# Patient Record
Sex: Female | Born: 1990
Health system: Southern US, Community
[De-identification: ages and names within clinical notes are randomized; demographics above are authoritative.]

## PROBLEM LIST (undated history)

## (undated) ENCOUNTER — Inpatient Hospital Stay: Payer: Self-pay

## (undated) DIAGNOSIS — F329 Major depressive disorder, single episode, unspecified: Secondary | ICD-10-CM

## (undated) DIAGNOSIS — K219 Gastro-esophageal reflux disease without esophagitis: Secondary | ICD-10-CM

## (undated) DIAGNOSIS — I10 Essential (primary) hypertension: Secondary | ICD-10-CM

## (undated) DIAGNOSIS — R51 Headache: Secondary | ICD-10-CM

## (undated) DIAGNOSIS — D649 Anemia, unspecified: Secondary | ICD-10-CM

## (undated) DIAGNOSIS — R519 Headache, unspecified: Secondary | ICD-10-CM

## (undated) DIAGNOSIS — B1910 Unspecified viral hepatitis B without hepatic coma: Secondary | ICD-10-CM

## (undated) DIAGNOSIS — F32A Depression, unspecified: Secondary | ICD-10-CM

## (undated) DIAGNOSIS — F419 Anxiety disorder, unspecified: Secondary | ICD-10-CM

## (undated) DIAGNOSIS — O98419 Viral hepatitis complicating pregnancy, unspecified trimester: Secondary | ICD-10-CM

## (undated) DIAGNOSIS — F319 Bipolar disorder, unspecified: Secondary | ICD-10-CM

## (undated) HISTORY — PX: NO PAST SURGERIES: SHX2092

---
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
## 2009-03-01 ENCOUNTER — Emergency Department: Payer: Self-pay | Admitting: Emergency Medicine

## 2010-02-18 ENCOUNTER — Emergency Department: Payer: Self-pay

## 2010-05-17 ENCOUNTER — Emergency Department: Payer: Self-pay | Admitting: Emergency Medicine

## 2010-06-27 ENCOUNTER — Emergency Department: Payer: Self-pay | Admitting: Emergency Medicine

## 2010-07-24 ENCOUNTER — Emergency Department: Payer: Self-pay | Admitting: Internal Medicine

## 2010-08-04 ENCOUNTER — Emergency Department: Payer: Self-pay | Admitting: Emergency Medicine

## 2010-08-06 ENCOUNTER — Encounter: Payer: Self-pay | Admitting: Maternal & Fetal Medicine

## 2010-08-20 ENCOUNTER — Encounter: Payer: Self-pay | Admitting: Obstetrics and Gynecology

## 2010-08-22 ENCOUNTER — Observation Stay: Payer: Self-pay

## 2010-09-07 ENCOUNTER — Observation Stay: Payer: Self-pay | Admitting: Obstetrics and Gynecology

## 2010-11-06 ENCOUNTER — Observation Stay: Payer: Self-pay | Admitting: Obstetrics and Gynecology

## 2010-12-01 ENCOUNTER — Observation Stay: Payer: Self-pay | Admitting: Obstetrics and Gynecology

## 2010-12-24 ENCOUNTER — Observation Stay: Payer: Self-pay

## 2011-01-03 ENCOUNTER — Observation Stay: Payer: Self-pay

## 2011-01-07 ENCOUNTER — Observation Stay: Payer: Self-pay

## 2011-01-09 ENCOUNTER — Inpatient Hospital Stay: Payer: Self-pay

## 2012-03-08 ENCOUNTER — Inpatient Hospital Stay: Payer: Self-pay

## 2012-03-08 LAB — HEPATIC FUNCTION PANEL A (ARMC)
Albumin: 2.8 g/dL — ABNORMAL LOW (ref 3.4–5.0)
Alkaline Phosphatase: 116 U/L (ref 50–136)
Bilirubin, Direct: 0.1 mg/dL (ref 0.00–0.20)
Bilirubin,Total: 0.6 mg/dL (ref 0.2–1.0)
SGOT(AST): 31 U/L (ref 15–37)
SGPT (ALT): 35 U/L
Total Protein: 7 g/dL (ref 6.4–8.2)

## 2012-03-08 LAB — PIH PROFILE
Anion Gap: 10 (ref 7–16)
BUN: 7 mg/dL (ref 7–18)
Calcium, Total: 8.5 mg/dL (ref 8.5–10.1)
Chloride: 107 mmol/L (ref 98–107)
Co2: 22 mmol/L (ref 21–32)
Creatinine: 0.58 mg/dL — ABNORMAL LOW (ref 0.60–1.30)
EGFR (African American): >60
EGFR (Non-African Amer.): >60
Glucose: 82 mg/dL (ref 65–99)
HCT: 36.1 % (ref 35.0–47.0)
HGB: 12.1 g/dL (ref 12.0–16.0)
MCH: 30.2 pg (ref 26.0–34.0)
MCHC: 33.4 g/dL (ref 32.0–36.0)
MCV: 90 fL (ref 80–100)
Osmolality: 275 (ref 275–301)
Platelet: 179 10*3/uL (ref 150–440)
Potassium: 3.8 mmol/L (ref 3.5–5.1)
RBC: 3.99 10*6/uL (ref 3.80–5.20)
RDW: 14.2 % (ref 11.5–14.5)
SGOT(AST): 29 U/L (ref 15–37)
Sodium: 139 mmol/L (ref 136–145)
Uric Acid: 4.6 mg/dL (ref 2.6–6.0)
WBC: 6.2 10*3/uL (ref 3.6–11.0)

## 2012-03-08 LAB — DRUG SCREEN, URINE

## 2012-03-08 LAB — CBC WITH DIFFERENTIAL/PLATELET
Basophil #: 0 10*3/uL (ref 0.0–0.1)
Basophil %: 0.3 %
Eosinophil #: 0 10*3/uL (ref 0.0–0.7)
Eosinophil %: 0.1 %
HCT: 31.9 % — ABNORMAL LOW (ref 35.0–47.0)
HGB: 10.7 g/dL — ABNORMAL LOW (ref 12.0–16.0)
Lymphocyte #: 1.5 10*3/uL (ref 1.0–3.6)
Lymphocyte %: 19 %
MCH: 30.5 pg (ref 26.0–34.0)
MCHC: 33.5 g/dL (ref 32.0–36.0)
MCV: 91 fL (ref 80–100)
Monocyte #: 0.7 10*3/uL (ref 0.0–0.7)
Monocyte %: 8.5 %
Neutrophil #: 5.6 10*3/uL (ref 1.4–6.5)
Neutrophil %: 72.1 %
Platelet: 147 10*3/uL — ABNORMAL LOW (ref 150–440)
RBC: 3.5 10*6/uL — ABNORMAL LOW (ref 3.80–5.20)
RDW: 14 % (ref 11.5–14.5)
WBC: 7.8 10*3/uL (ref 3.6–11.0)

## 2012-03-08 LAB — RAPID HIV-1/2 QL/CONFIRM: HIV-1/2,Rapid Ql: NEGATIVE

## 2012-03-08 LAB — PROTEIN / CREATININE RATIO, URINE
Creatinine, Urine: 127.3 mg/dL — ABNORMAL HIGH (ref 30.0–125.0)
Protein, Random Urine: 29 mg/dL — ABNORMAL HIGH (ref 0–12)
Protein/Creat. Ratio: 228 mg/gCREAT — ABNORMAL HIGH (ref 0–200)

## 2012-03-09 LAB — HEMATOCRIT: HCT: 26.4 % — ABNORMAL LOW (ref 35.0–47.0)

## 2012-03-12 LAB — BETA STREP CULTURE(ARMC)

## 2012-04-07 ENCOUNTER — Emergency Department: Payer: Self-pay | Admitting: Emergency Medicine

## 2012-04-07 LAB — URINALYSIS, COMPLETE
Bilirubin,UR: NEGATIVE
Glucose,UR: NEGATIVE mg/dL (ref 0–75)
Ketone: NEGATIVE
Nitrite: NEGATIVE
Ph: 6 (ref 4.5–8.0)
Protein: 30
RBC,UR: 2429 /HPF (ref 0–5)
Specific Gravity: 1.025 (ref 1.003–1.030)
Squamous Epithelial: 2
WBC UR: 14 /HPF (ref 0–5)

## 2012-04-07 LAB — CBC
HCT: 36 % (ref 35.0–47.0)
HGB: 11.8 g/dL — ABNORMAL LOW (ref 12.0–16.0)
MCH: 29.2 pg (ref 26.0–34.0)
MCHC: 32.7 g/dL (ref 32.0–36.0)
MCV: 89 fL (ref 80–100)
Platelet: 225 10*3/uL (ref 150–440)
RBC: 4.04 10*6/uL (ref 3.80–5.20)
RDW: 14.1 % (ref 11.5–14.5)
WBC: 5.7 10*3/uL (ref 3.6–11.0)

## 2012-04-07 LAB — PREGNANCY, URINE: Pregnancy Test, Urine: NEGATIVE m[IU]/mL

## 2012-06-27 ENCOUNTER — Emergency Department: Payer: Self-pay | Admitting: Internal Medicine

## 2012-06-27 LAB — COMPREHENSIVE METABOLIC PANEL
Albumin: 3.4 g/dL (ref 3.4–5.0)
Alkaline Phosphatase: 105 U/L (ref 50–136)
Anion Gap: 9 (ref 7–16)
BUN: 8 mg/dL (ref 7–18)
Bilirubin,Total: 0.6 mg/dL (ref 0.2–1.0)
Calcium, Total: 8.5 mg/dL (ref 8.5–10.1)
Chloride: 106 mmol/L (ref 98–107)
Co2: 26 mmol/L (ref 21–32)
Creatinine: 0.72 mg/dL (ref 0.60–1.30)
EGFR (African American): >60
EGFR (Non-African Amer.): >60
Glucose: 94 mg/dL (ref 65–99)
Osmolality: 279 (ref 275–301)
Potassium: 3.9 mmol/L (ref 3.5–5.1)
SGOT(AST): 25 U/L (ref 15–37)
SGPT (ALT): 31 U/L
Sodium: 141 mmol/L (ref 136–145)
Total Protein: 8.1 g/dL (ref 6.4–8.2)

## 2012-06-27 LAB — PREGNANCY, URINE: Pregnancy Test, Urine: NEGATIVE m[IU]/mL

## 2012-06-27 LAB — CBC
HCT: 35 % (ref 35.0–47.0)
HGB: 10.9 g/dL — ABNORMAL LOW (ref 12.0–16.0)
MCH: 26.3 pg (ref 26.0–34.0)
MCHC: 31.2 g/dL — ABNORMAL LOW (ref 32.0–36.0)
MCV: 84 fL (ref 80–100)
Platelet: 236 10*3/uL (ref 150–440)
RBC: 4.16 10*6/uL (ref 3.80–5.20)
RDW: 14.8 % — ABNORMAL HIGH (ref 11.5–14.5)
WBC: 4.9 10*3/uL (ref 3.6–11.0)

## 2012-06-27 LAB — URINALYSIS, COMPLETE
Bilirubin,UR: NEGATIVE
Glucose,UR: NEGATIVE mg/dL (ref 0–75)
Ketone: NEGATIVE
Leukocyte Esterase: NEGATIVE
Nitrite: POSITIVE
Ph: 7 (ref 4.5–8.0)
Protein: NEGATIVE
RBC,UR: 1377 /HPF (ref 0–5)
Specific Gravity: 1.017 (ref 1.003–1.030)
Squamous Epithelial: 3
WBC UR: 5 /HPF (ref 0–5)

## 2015-04-18 NOTE — H&P (Signed)
L&D Evaluation:  History:   HPI 24 year old G4 P1021 who had her first delivery 10 Jan 2011 presents at 373/7 weeks with c/o contractions since around midnight. Has had no prenatal care. EDC=18 April based upon her LMP which she cannot remember. She also c/o lower extremity swelling R>L for the past two weeks, right temporal headaches, and nausea.Denies RUQ pain, VB, or LOF. Past medical hx significant for being a Hep B carrier. Her SVD in 01/2011 remarkable for delivering a 7#10oz female at 41 weeks after IOL. Had uterine atony with that delivery.    Presents with contractions    Patient's Medical History Hept B carrier, Obesity    Patient's Surgical History none    Medications Pre Natal Vitamins    Allergies PCN, swelling    Social History none  Denies substance abuse. Moved back to [place] to live with father of first baby. He does not know about this baby. Mother and father in [place]. Will be placing baby for adoption with family.    Family History Non-Contributory   ROS:   ROS see HPI-also positive for sore throat and cough.   Exam:   Vital Signs BP >140/90  153/98    Urine Protein trace    General breathing with ctxs and holding on to side rails    Mental Status clear    Chest clear    Heart normal sinus rhythm, no murmur/gallop/rubs    Abdomen gravid, tender with contractions    Estimated Fetal Weight Average for gestational age, 116 1/2 #    Fetal Position vtx (OP) on ultrasound, placenta fundal    Edema 2+  right>left    Reflexes 3+    Pelvic no external lesions, 5/80%/-1 to -2    Mebranes Intact    FHT normal rate with no decels, 130 with accels to 150    Ucx regular, q4-5 min apart.   Impression:   Impression IUP at 37 3/7 by dates in labor.. No PNC. Hept B carrier. R/O Preeclampsia   Plan:   Plan LABS including Hept B panel and PIH panel. GBS culture done. Start  Kefzol for GBS prophyllaxis (unknown status). Stadol for pain. Epidural if plt WNL.    Electronic Signatures: Trinna BalloonGutierrez, Talon Witting L (CNM)  (Signed 02-Apr-13 11:00)  Authored: L&D Evaluation   Last Updated: 02-Apr-13 11:00 by Trinna BalloonGutierrez, Donnell Beauchamp L (CNM)

## 2015-12-10 NOTE — L&D Delivery Note (Signed)
Obstetrical Delivery Note   Date of Delivery:   11/25/2016 Primary OB:   [facility] Gestational Age/EDD: [id number]  Antepartum complications: 1) PICA 2) headaches 3) anemia in pregnancy 4) Morbid obesity, bmi 45 5) Proteinuria in pregnancy 6) Trichomonas - treated with negative test of cure 7) Hyperemesis gravidarum 8) Chlamydia - treated with negative test of cure 9) Mixed anxiety and depression disorder 10) Bipolar 1 disorder 11) chronic hepatitis B  12) GHTN Intrapartum complications: None Delivered By:   T. Alejah Aristizabal, CNM Delivery Type:   spontaneous vaginal delivery  Anesthesia:    epidural  GBS:    Negative Laceration:    none Episiotomy:    none Placenta:    Delivered via active management. To pathology: no.  Estimated Blood Loss:  200mL Baby's Name:   Tobin ChadKenadie Baby:    Liveborn female, APGARs 9/9, weight pending gm   Delivery Details:   Quick second stage. Infant to maternal abdomen for delayed cord clamping, cut by pt's family members after pulsation stopped. Infant to skin-to-skin with mother.

## 2016-04-15 ENCOUNTER — Encounter: Payer: Self-pay | Admitting: Emergency Medicine

## 2016-04-15 ENCOUNTER — Emergency Department
Admission: EM | Admit: 2016-04-15 | Discharge: 2016-04-15 | Disposition: A | Discharge status: Home / Self Care | Payer: Medicaid Other | Attending: Emergency Medicine | Admitting: Emergency Medicine

## 2016-04-15 ENCOUNTER — Emergency Department: Payer: Medicaid Other

## 2016-04-15 DIAGNOSIS — O26899 Other specified pregnancy related conditions, unspecified trimester: Secondary | ICD-10-CM

## 2016-04-15 DIAGNOSIS — O98311 Other infections with a predominantly sexual mode of transmission complicating pregnancy, first trimester: Secondary | ICD-10-CM | POA: Insufficient documentation

## 2016-04-15 DIAGNOSIS — Z87891 Personal history of nicotine dependence: Secondary | ICD-10-CM | POA: Insufficient documentation

## 2016-04-15 DIAGNOSIS — R1030 Lower abdominal pain, unspecified: Secondary | ICD-10-CM | POA: Diagnosis present

## 2016-04-15 DIAGNOSIS — Z3A01 Less than 8 weeks gestation of pregnancy: Secondary | ICD-10-CM | POA: Diagnosis not present

## 2016-04-15 DIAGNOSIS — A599 Trichomoniasis, unspecified: Secondary | ICD-10-CM

## 2016-04-15 DIAGNOSIS — R109 Unspecified abdominal pain: Secondary | ICD-10-CM

## 2016-04-15 LAB — URINALYSIS COMPLETE WITH MICROSCOPIC (ARMC ONLY)
Bacteria, UA: NONE SEEN
Bilirubin Urine: NEGATIVE
Glucose, UA: NEGATIVE mg/dL
Hgb urine dipstick: NEGATIVE
Ketones, ur: NEGATIVE mg/dL
Leukocytes, UA: NEGATIVE
Nitrite: NEGATIVE
Protein, ur: NEGATIVE mg/dL
Specific Gravity, Urine: 1.015 (ref 1.005–1.030)
pH: 6 (ref 5.0–8.0)

## 2016-04-15 LAB — WET PREP, GENITAL
Clue Cells Wet Prep HPF POC: NONE SEEN
Sperm: NONE SEEN
Yeast Wet Prep HPF POC: NONE SEEN

## 2016-04-15 LAB — CBC WITH DIFFERENTIAL/PLATELET
Basophils Absolute: 0.1 10*3/uL (ref 0–0.1)
Basophils Relative: 1 %
Eosinophils Absolute: 0.1 10*3/uL (ref 0–0.7)
Eosinophils Relative: 1 %
HCT: 38.4 % (ref 35.0–47.0)
Hemoglobin: 12.7 g/dL (ref 12.0–16.0)
Lymphocytes Relative: 36 %
Lymphs Abs: 2.7 10*3/uL (ref 1.0–3.6)
MCH: 29.9 pg (ref 26.0–34.0)
MCHC: 33.1 g/dL (ref 32.0–36.0)
MCV: 90.1 fL (ref 80.0–100.0)
Monocytes Absolute: 0.6 10*3/uL (ref 0.2–0.9)
Monocytes Relative: 8 %
Neutro Abs: 4 10*3/uL (ref 1.4–6.5)
Neutrophils Relative %: 54 %
Platelets: 218 10*3/uL (ref 150–440)
RBC: 4.26 MIL/uL (ref 3.80–5.20)
RDW: 13.1 % (ref 11.5–14.5)
WBC: 7.5 10*3/uL (ref 3.6–11.0)

## 2016-04-15 LAB — COMPREHENSIVE METABOLIC PANEL
ALT: 20 U/L (ref 14–54)
AST: 22 U/L (ref 15–41)
Albumin: 4.2 g/dL (ref 3.5–5.0)
Alkaline Phosphatase: 72 U/L (ref 38–126)
Anion gap: 10 (ref 5–15)
BUN: 11 mg/dL (ref 6–20)
CO2: 25 mmol/L (ref 22–32)
Calcium: 9.3 mg/dL (ref 8.9–10.3)
Chloride: 100 mmol/L — ABNORMAL LOW (ref 101–111)
Creatinine, Ser: 0.63 mg/dL (ref 0.44–1.00)
GFR calc Af Amer: >60 mL/min (ref >60)
GFR calc non Af Amer: >60 mL/min (ref >60)
Glucose, Bld: 91 mg/dL (ref 65–99)
Potassium: 3.4 mmol/L — ABNORMAL LOW (ref 3.5–5.1)
Sodium: 135 mmol/L (ref 135–145)
Total Bilirubin: 0.7 mg/dL (ref 0.3–1.2)
Total Protein: 8.3 g/dL — ABNORMAL HIGH (ref 6.5–8.1)

## 2016-04-15 LAB — CHLAMYDIA/NGC RT PCR (ARMC ONLY)
Chlamydia Tr: DETECTED — AB
N gonorrhoeae: NOT DETECTED

## 2016-04-15 LAB — HCG, QUANTITATIVE, PREGNANCY: hCG, Beta Chain, Quant, S: 21830 m[IU]/mL — ABNORMAL HIGH

## 2016-04-15 MED ORDER — METRONIDAZOLE 500 MG PO TABS: 500.0000 mg | ORAL_TABLET | Freq: Two times a day (BID) | ORAL | Status: DC

## 2016-04-15 NOTE — ED Notes (Signed)
Lower abdominal pain x 1 week. States approx [redacted] weeks pregnant. Gravida 5, Para 2, miscarriage x 2. Denies vaginal bleeding.

## 2016-04-15 NOTE — Discharge Instructions (Signed)
Abdominal Pain, Adult Many things can cause belly (abdominal) pain. Most times, the belly pain is not dangerous. Many cases of belly pain can be watched and treated at home. HOME CARE   Do not take medicines that help you go poop (laxatives) unless told to by your doctor.  Only take medicine as told by your doctor.  Eat or drink as told by your doctor. Your doctor will tell you if you should be on a special diet. GET HELP IF:  You do not know what is causing your belly pain.  You have belly pain while you are sick to your stomach (nauseous) or have runny poop (diarrhea).  You have pain while you pee or poop.  Your belly pain wakes you up at night.  You have belly pain that gets worse or better when you eat.  You have belly pain that gets worse when you eat fatty foods.  You have a fever. GET HELP RIGHT AWAY IF:   The pain does not go away within 2 hours.  You keep throwing up (vomiting).  The pain changes and is only in the right or left part of the belly.  You have bloody or tarry looking poop. MAKE SURE YOU:   Understand these instructions.  Will watch your condition.  Will get help right away if you are not doing well or get worse.   This information is not intended to replace advice given to you by your health care provider. Make sure you discuss any questions you have with your health care provider.   Document Released: 05/13/2008 Document Revised: 12/16/2014 Document Reviewed: 08/04/2013 Elsevier Interactive Patient Education 2016 [employer].  Trichomoniasis Trichomoniasis is an infection caused by an organism called Trichomonas. The infection can affect both women and men. In women, the outer female genitalia and the vagina are affected. In men, the penis is mainly affected, but the prostate and other reproductive organs can also be involved. Trichomoniasis is a sexually transmitted infection (STI) and is most often passed to another person through sexual  contact.  RISK FACTORS  Having unprotected sexual intercourse.  Having sexual intercourse with an infected partner. SIGNS AND SYMPTOMS  Symptoms of trichomoniasis in women include:  Abnormal gray-green frothy vaginal discharge.  Itching and irritation of the vagina.  Itching and irritation of the area outside the vagina. Symptoms of trichomoniasis in men include:   Penile discharge with or without pain.  Pain during urination. This results from inflammation of the urethra. DIAGNOSIS  Trichomoniasis may be found during a Pap test or physical exam. Your health care provider may use one of the following methods to help diagnose this infection:  Testing the pH of the vagina with a test tape.  Using a vaginal swab test that checks for the Trichomonas organism. A test is available that provides results within a few minutes.  Examining a urine sample.  Testing vaginal secretions. Your health care provider may test you for other STIs, including HIV. TREATMENT   You may be given medicine to fight the infection. Women should inform their health care provider if they could be or are pregnant. Some medicines used to treat the infection should not be taken during pregnancy.  Your health care provider may recommend over-the-counter medicines or creams to decrease itching or irritation.  Your sexual partner will need to be treated if infected.  Your health care provider may test you for infection again 3 months after treatment. HOME CARE INSTRUCTIONS   Take medicines only  as directed by your health care provider.  Take over-the-counter medicine for itching or irritation as directed by your health care provider.  Do not have sexual intercourse while you have the infection.  Women should not douche or wear tampons while they have the infection.  Discuss your infection with your partner. Your partner may have gotten the infection from you, or you may have gotten it from your  partner.  Have your sex partner get examined and treated if necessary.  Practice safe, informed, and protected sex.  See your health care provider for other STI testing. SEEK MEDICAL CARE IF:   You still have symptoms after you finish your medicine.  You develop abdominal pain.  You have pain when you urinate.  You have bleeding after sexual intercourse.  You develop a rash.  Your medicine makes you sick or makes you throw up (vomit). MAKE SURE YOU:  Understand these instructions.  Will watch your condition.  Will get help right away if you are not doing well or get worse.   This information is not intended to replace advice given to you by your health care provider. Make sure you discuss any questions you have with your health care provider.   Document Released: 05/21/2001 Document Revised: 12/16/2014 Document Reviewed: 09/06/2013 Elsevier Interactive Patient Education [employer]2016 Elsevier Inc.

## 2016-04-15 NOTE — ED Notes (Signed)
Pt presents with back pain that radiates to front, but states pain is worse when she is lying down and she just can't get comfortable. Pt states she was seen at health department today and they sent her here to rule out ectopic pregnancy. Pt alert & oriented with NAD noted. Pt is [redacted] weeks pregnant.

## 2016-04-15 NOTE — ED Provider Notes (Signed)
[facility] Emergency Department Provider Note  Time seen: 5:50 PM  I have reviewed the triage vital signs and the nursing notes.   HISTORY  Chief Complaint Abdominal Pain    HPI MAME TWOMBLY is a 25 y.o. female G5 P2 A2 with no past medical history presents the emergency department approximately [redacted] weeks pregnant with lower abdominal cramping. According to the patient she called the health Department today to set up an appointment, stated that she was having lower abdominal cramping for the past 2 weeks, recently had a positive pregnancy test and is having vaginal itching and discharge as well. They advised the patient come to the emergency department for further evaluation. Patient states mild intermittent abdominal cramping located in the lower abdomen. Patient is having mild white discharge, along with vaginal itching. Denies any vaginal bleeding.Denies any cramping currently.     History reviewed. No pertinent past medical history.  There are no active problems to display for this patient.   History reviewed. No pertinent past surgical history.  No current outpatient prescriptions on file.  Allergies Penicillins  No family history on file.  Social History Social History  Substance Use Topics  . Smoking status: Former Games developer  . Smokeless tobacco: None  . Alcohol Use: Yes    Review of Systems Constitutional: Negative for fever. Cardiovascular: Negative for chest pain. Respiratory: Negative for shortness of breath. Gastrointestinal: Intermittent lower abdominal cramping. Negative for nausea, vomiting, diarrhea Genitourinary: Slight dysuria. Vaginal itching with mild white discharge Musculoskeletal: Negative for back pain. Neurological: Negative for headache 10-point ROS otherwise negative.  ____________________________________________   PHYSICAL EXAM:  VITAL SIGNS: ED Triage Vitals  Enc Vitals Group     BP 04/15/16 1657 142/83  mmHg     Pulse Rate 04/15/16 1657 76     Resp 04/15/16 1657 20     Temp 04/15/16 1657 98.3 F (36.8 C)     Temp Source 04/15/16 1657 Oral     SpO2 04/15/16 1657 100 %     Weight 04/15/16 1657 293 lb (132.904 kg)     Height 04/15/16 1657  (1.676 m)     Head Cir --      Peak Flow --      Pain Score 04/15/16 1658 7     Pain Loc --      Pain Edu? --      Excl. in GC? --     Constitutional: Alert and oriented. Well appearing and in no distress. Eyes: Normal exam ENT   Head: Normocephalic and atraumatic.   Mouth/Throat: Mucous membranes are moist. Cardiovascular: Normal rate, regular rhythm. No murmur Respiratory: Normal respiratory effort without tachypnea nor retractions. Breath sounds are clear  Gastrointestinal: Soft and nontender. No distention. Musculoskeletal: Nontender with normal range of motion in all extremities.  Neurologic:  Normal speech and language. No gross focal neurologic deficits Skin:  Skin is warm, dry and intact.  Psychiatric: Mood and affect are normal. Speech and behavior are normal  ____________________________________________   RADIOLOGY  Ultrasound consistent with 6 weeks 0 day IUP.  ____________________________________________    INITIAL IMPRESSION / ASSESSMENT AND PLAN / ED COURSE  Pertinent labs & imaging results that were available during my care of the patient were reviewed by me and considered in my medical decision making (see chart for details).  Patient presents the emergency department lower abdominal cramping and white vaginal discharge. Reported pelvic examination in the emergency department. Patient has a nontender abdominal exam. As  the patient is pregnant we'll perform an ultrasound to rule out ectopic.  Pelvic exam shows minimal amount of discharge. Patient does have the small lesion approximately half centimeter in diameter to her right vaginal wall. Patient states she was not aware of this lesion, discussed with the  patient need to follow-up with OB/GYN for further evaluation and possible Pap smear. Patient agreeable to do so. Currently awaiting lab results and ultrasound results.  Patient's workup is positive for trichomoniasis. Ultrasound consistent with 6 week 0 day IUP. We'll discharge home with OB/GYN follow-up and Flagyl for treatment. ____________________________________________   FINAL CLINICAL IMPRESSION(S) / ED DIAGNOSES  Abdominal pain and pregnancy Trichomoniasis  Minna AntisKevin Aliahna Statzer, MD 04/15/16 1929

## 2016-04-16 ENCOUNTER — Telehealth: Payer: Self-pay | Admitting: Emergency Medicine

## 2016-04-16 NOTE — ED Notes (Signed)
Called to inform patient of positive chlamydia test and need for treatment.  Per dr Mayford Knifewilliams can call in azithromycin 1 gram po to pt preferred pharmacy.  Left message asking pt to call me.

## 2016-04-19 ENCOUNTER — Telehealth: Payer: Self-pay | Admitting: Emergency Medicine

## 2016-04-19 NOTE — ED Notes (Signed)
Pt called me back.  Explained positive chlamydia.  Will call the azithromycin to [facility] for patient.  Also instructed her to inform partner to get treated at pcp or [facility].

## 2016-05-01 ENCOUNTER — Other Ambulatory Visit: Payer: Self-pay | Admitting: Family Medicine

## 2016-05-01 DIAGNOSIS — O3680X0 Pregnancy with inconclusive fetal viability, not applicable or unspecified: Secondary | ICD-10-CM

## 2016-05-01 LAB — OB RESULTS CONSOLE HIV ANTIBODY (ROUTINE TESTING): HIV: NONREACTIVE

## 2016-05-02 LAB — OB RESULTS CONSOLE ABO/RH: RH Type: POSITIVE

## 2016-05-02 LAB — OB RESULTS CONSOLE HEPATITIS B SURFACE ANTIGEN: Hepatitis B Surface Ag: POSITIVE

## 2016-05-02 LAB — OB RESULTS CONSOLE VARICELLA ZOSTER ANTIBODY, IGG: Varicella: IMMUNE

## 2016-05-02 LAB — OB RESULTS CONSOLE ANTIBODY SCREEN: Antibody Screen: NEGATIVE

## 2016-05-02 LAB — OB RESULTS CONSOLE RUBELLA ANTIBODY, IGM: Rubella: IMMUNE

## 2016-05-11 ENCOUNTER — Emergency Department
Admission: EM | Admit: 2016-05-11 | Discharge: 2016-05-11 | Disposition: A | Discharge status: Home / Self Care | Payer: Medicaid Other | Attending: Emergency Medicine | Admitting: Emergency Medicine

## 2016-05-11 DIAGNOSIS — Z3A11 11 weeks gestation of pregnancy: Secondary | ICD-10-CM | POA: Insufficient documentation

## 2016-05-11 DIAGNOSIS — O98311 Other infections with a predominantly sexual mode of transmission complicating pregnancy, first trimester: Secondary | ICD-10-CM | POA: Insufficient documentation

## 2016-05-11 DIAGNOSIS — O219 Vomiting of pregnancy, unspecified: Secondary | ICD-10-CM | POA: Diagnosis present

## 2016-05-11 DIAGNOSIS — O2341 Unspecified infection of urinary tract in pregnancy, first trimester: Secondary | ICD-10-CM | POA: Diagnosis not present

## 2016-05-11 DIAGNOSIS — A599 Trichomoniasis, unspecified: Secondary | ICD-10-CM

## 2016-05-11 DIAGNOSIS — O210 Mild hyperemesis gravidarum: Secondary | ICD-10-CM | POA: Insufficient documentation

## 2016-05-11 DIAGNOSIS — Z87891 Personal history of nicotine dependence: Secondary | ICD-10-CM | POA: Insufficient documentation

## 2016-05-11 LAB — COMPREHENSIVE METABOLIC PANEL
ALT: 44 U/L (ref 14–54)
AST: 35 U/L (ref 15–41)
Albumin: 3.9 g/dL (ref 3.5–5.0)
Alkaline Phosphatase: 49 U/L (ref 38–126)
Anion gap: 8 (ref 5–15)
BUN: 7 mg/dL (ref 6–20)
CO2: 24 mmol/L (ref 22–32)
Calcium: 8.9 mg/dL (ref 8.9–10.3)
Chloride: 103 mmol/L (ref 101–111)
Creatinine, Ser: 0.56 mg/dL (ref 0.44–1.00)
GFR calc Af Amer: >60 mL/min (ref >60)
GFR calc non Af Amer: >60 mL/min (ref >60)
Glucose, Bld: 97 mg/dL (ref 65–99)
Potassium: 3.6 mmol/L (ref 3.5–5.1)
Sodium: 135 mmol/L (ref 135–145)
Total Bilirubin: 1 mg/dL (ref 0.3–1.2)
Total Protein: 7.9 g/dL (ref 6.5–8.1)

## 2016-05-11 LAB — URINALYSIS COMPLETE WITH MICROSCOPIC (ARMC ONLY)
Bacteria, UA: NONE SEEN
Bilirubin Urine: NEGATIVE
Bilirubin Urine: NEGATIVE
Glucose, UA: NEGATIVE mg/dL
Glucose, UA: NEGATIVE mg/dL
Hgb urine dipstick: NEGATIVE
Hgb urine dipstick: NEGATIVE
Nitrite: NEGATIVE
Nitrite: NEGATIVE
Protein, ur: 30 mg/dL — AB
Protein, ur: 30 mg/dL — AB
Specific Gravity, Urine: 1.029 (ref 1.005–1.030)
Specific Gravity, Urine: 1.026 (ref 1.005–1.030)
pH: 6 (ref 5.0–8.0)
pH: 7 (ref 5.0–8.0)

## 2016-05-11 LAB — CBC
HCT: 35.9 % (ref 35.0–47.0)
Hemoglobin: 12.3 g/dL (ref 12.0–16.0)
MCH: 30.3 pg (ref 26.0–34.0)
MCHC: 34.1 g/dL (ref 32.0–36.0)
MCV: 88.8 fL (ref 80.0–100.0)
Platelets: 207 10*3/uL (ref 150–440)
RBC: 4.05 MIL/uL (ref 3.80–5.20)
RDW: 13.1 % (ref 11.5–14.5)
WBC: 5.8 10*3/uL (ref 3.6–11.0)

## 2016-05-11 LAB — LIPASE, BLOOD: Lipase: 23 U/L (ref 11–51)

## 2016-05-11 LAB — WET PREP, GENITAL
Clue Cells Wet Prep HPF POC: NONE SEEN
Sperm: NONE SEEN
Yeast Wet Prep HPF POC: NONE SEEN

## 2016-05-11 LAB — RAPID HIV SCREEN (HIV 1/2 AB+AG)
HIV 1/2 Antibodies: NONREACTIVE
HIV-1 P24 Antigen - HIV24: NONREACTIVE

## 2016-05-11 LAB — CHLAMYDIA/NGC RT PCR (ARMC ONLY)
Chlamydia Tr: NOT DETECTED
N gonorrhoeae: NOT DETECTED

## 2016-05-11 LAB — HCG, QUANTITATIVE, PREGNANCY: hCG, Beta Chain, Quant, S: 96453 m[IU]/mL — ABNORMAL HIGH (ref <5)

## 2016-05-11 MED ORDER — PROMETHAZINE HCL 25 MG/ML IJ SOLN
12.5000 mg | Freq: Once | INTRAMUSCULAR | Status: AC
  Administered 2016-05-11: 25 mg via INTRAVENOUS
  Filled 2016-05-11: qty 1

## 2016-05-11 MED ORDER — SODIUM CHLORIDE 0.9 % IV BOLUS (SEPSIS)
1000.0000 mL | Freq: Once | INTRAVENOUS | Status: AC
  Administered 2016-05-11: 1000 mL via INTRAVENOUS

## 2016-05-11 MED ORDER — DOXYLAMINE-PYRIDOXINE 10-10 MG PO TBEC: DELAYED_RELEASE_TABLET | ORAL | Status: DC

## 2016-05-11 MED ORDER — NITROFURANTOIN MONOHYD MACRO 100 MG PO CAPS: 100.0000 mg | ORAL_CAPSULE | Freq: Two times a day (BID) | ORAL | Status: AC

## 2016-05-11 MED ORDER — METRONIDAZOLE 500 MG PO TABS: 500.0000 mg | ORAL_TABLET | Freq: Two times a day (BID) | ORAL | Status: DC

## 2016-05-11 NOTE — ED Provider Notes (Addendum)
[facility] Emergency Department Provider Note  ____________________________________________   I have reviewed the triage vital signs and the nursing notes.   HISTORY  Chief Complaint Emesis During Pregnancy    HPI Regina Chandler is a 25 y.o. female who is G5 P2 with 2 miscarriages in the past, who has a known IUP with prior ultrasound who is [redacted] weeks pregnant presents today with nausea vomiting. Patient states she has had nausea vomiting since inception of this pregnancy. She has been treated for chlamydia and trichomonas during this urgency she did take the medications and she has not had any sexual relations and she states of any variety since then. She states her partner has been treated. Patient also has a history of urinary tract infection during this pregnancy. She states she cannot take prednisone. She denies any fever or chills. She has had no diarrhea. She is having normal bowel movements. She has an occasional cramping abdominal discomfort which is diffuse, and associated only with vomiting. She vomited several times a day for the last month or so. She denies any ongoing headache although sometimes vomiting makes her head hurt she has no headache today. She states she's never had a severe headache, she denies any numbness or weakness change in vision or hearing. She states that she just feels a little dehydrated and is "the kind of headache to get when you're dehydrated". Patient states that she has had no vaginal discharge, no vaginal bleeding of any variety, no lower abdominal cramping. She denies any focal abdominal pain or tenderness at this time.    No past medical history on file.  There are no active problems to display for this patient.   No past surgical history on file.  No current outpatient prescriptions on file.  Allergies Penicillins  No family history on file.  Social History Social History  Substance Use Topics  . Smoking  status: Former Games developermoker  . Smokeless tobacco: Not on file  . Alcohol Use: Yes    Review of Systems Constitutional: No fever/chills Eyes: No visual changes. ENT: No sore throat. No stiff neck no neck pain Cardiovascular: Denies chest pain. Respiratory: Denies shortness of breath. Gastrointestinal:   Positive vomiting.  No diarrhea.  No constipation. Genitourinary: Negative for dysuria. Musculoskeletal: Negative lower extremity swelling Skin: Negative for rash. Neurological: Negative for headaches, focal weakness or numbness. 10-point ROS otherwise negative.  ____________________________________________   PHYSICAL EXAM:  VITAL SIGNS: ED Triage Vitals  Enc Vitals Group     BP 05/11/16 1136 118/77 mmHg     Pulse Rate 05/11/16 1136 78     Resp 05/11/16 1136 20     Temp 05/11/16 1136 98.1 F (36.7 C)     Temp Source 05/11/16 1136 Oral     SpO2 05/11/16 1136 98 %     Weight 05/11/16 1136 284 lb (128.822 kg)     Height 05/11/16 1136 5\' 6"  (1.676 m)     Head Cir --      Peak Flow --      Pain Score 05/11/16 1138 7     Pain Loc --      Pain Edu? --      Excl. in GC? --     Constitutional: Alert and oriented. Well appearing and in no acute distress. Well-appearing Eyes: Conjunctivae are normal. PERRL. EOMI. Head: Atraumatic. Nose: No congestion/rhinnorhea. Mouth/Throat: Mucous membranes are slightly dry.  Oropharynx non-erythematous. Neck: No stridor.   Nontender with no meningismus Cardiovascular: Normal  rate, regular rhythm. Grossly normal heart sounds.  Good peripheral circulation. Respiratory: Normal respiratory effort.  No retractions. Lungs CTAB. Abdominal: Soft and nontender. No distention. No guarding no rebound Back:  There is no focal tenderness or step off there is no midline tenderness there are no lesions noted. there is no CVA tenderness Pelvic exam: Female nurse chaperone present, no external lesions noted, physiologic vaginal discharge noted with no purulent  discharge, no cervical motion tenderness, no adnexal tenderness or mass, there is no significant uterine tenderness or mass. No vaginal bleeding Musculoskeletal: No lower extremity tenderness. No joint effusions, no DVT signs strong distal pulses no edema Neurologic:  Normal speech and language. No gross focal neurologic deficits are appreciated.  Skin:  Skin is warm, dry and intact. No rash noted. Psychiatric: Mood and affect are normal. Speech and behavior are normal.  ____________________________________________   LABS (all labs ordered are listed, but only abnormal results are displayed)  Labs Reviewed  URINALYSIS COMPLETEWITH MICROSCOPIC ([facility] ONLY) - Abnormal; Notable for the following:    Color, Urine AMBER (*)    APPearance CLOUDY (*)    Ketones, ur 1+ (*)    Protein, ur 30 (*)    Leukocytes, UA 3+ (*)    Bacteria, UA RARE (*)    Squamous Epithelial / LPF TOO NUMEROUS TO COUNT (*)    All other components within normal limits  HCG, QUANTITATIVE, PREGNANCY - Abnormal; Notable for the following:    hCG, Beta Chain, Quant, S 16109 (*)    All other components within normal limits  URINALYSIS COMPLETEWITH MICROSCOPIC ([facility] ONLY) - Abnormal; Notable for the following:    Color, Urine AMBER (*)    APPearance CLOUDY (*)    Ketones, ur 1+ (*)    Protein, ur 30 (*)    Leukocytes, UA 2+ (*)    Squamous Epithelial / LPF 0-5 (*)    All other components within normal limits  URINE CULTURE  CHLAMYDIA/NGC RT PCR ([facility] ONLY)  WET PREP, GENITAL  LIPASE, BLOOD  COMPREHENSIVE METABOLIC PANEL  CBC  RPR  RAPID HIV SCREEN (HIV 1/2 AB+AG)   ____________________________________________  EKG  I personally interpreted any EKGs ordered by me or triage  ____________________________________________  RADIOLOGY  I reviewed any imaging ordered by me or triage that were performed during my shift and, if possible, patient and/or family made aware of any abnormal  findings. ____________________________________________   PROCEDURES  Procedure(s) performed: None  Critical Care performed: None  ____________________________________________   INITIAL IMPRESSION / ASSESSMENT AND PLAN / ED COURSE  Pertinent labs & imaging results that were available during my care of the patient were reviewed by me and considered in my medical decision making (see chart for details).  Patient with nausea vomiting for 10 weeks during her pregnancy consistent with hyperemesis. Abdomen is completely benign blood work is reassuring is nothing to suggest this is a gallbladder issue, she has nothing to suggest appendicitis intussusception or any other acute intra-abdominal pathology. She has no vaginal bleeding and she is pre-viable. There is no indication for blood type or screen therefore. Patient has had no infectious syndromes. She denies dysuria or urinary frequency. We did do an in and out catheter urine which appears to be possibly consistent with UTI, we will treat her empirically for that. Patient has no symptoms of pyelonephritis however she has no flank pain. She has no fever she has no elevated white count. Patient stated she had a mild headache, this time is nothing  to suggest bleed, dural venous thrombosis, stroke etc. She is very well-appearing and neurologically intact. Patient does have a history of STI's and given her vomiting and did wish to check a pelvic exam which she consented to, we'll also check HIV and RPR for outpatient follow-up. The patient's pelvic exam does not show any evidence of PID, she has had a slight discharge which does not appear to be purulent. We will check for Chlamydia gonorrhea and Trichomonas again. Given patient's anaphylactic reaction to penicillin, we will avoid using Rocephin unless necessary. Mostly however patient is here for IV hydration which we are providing and we will send her home with  diclegis____________________________________________   FINAL CLINICAL IMPRESSION(S) / ED DIAGNOSES  Final diagnoses:  None      This chart was dictated using voice recognition software.  Despite best efforts to proofread,  errors can occur which can change meaning.      Jeanmarie Plant, MD 05/11/16 1604  Jeanmarie Plant, MD 05/11/16 [phone]

## 2016-05-11 NOTE — ED Notes (Signed)
Pt reports being [redacted] weeks pregnant report abd cramping. Denies vaginal bleeding. Pt reports constant vomiting and nausea. Reports vomiting multiple times this morning pt also  Reports headache.

## 2016-05-11 NOTE — ED Notes (Signed)
Pt verbalized understanding of discharge instructions. NAD at this time. 

## 2016-05-11 NOTE — ED Notes (Signed)
MD McShane at bedside  

## 2016-05-11 NOTE — Discharge Instructions (Signed)
Hyperemesis Gravidarum  Hyperemesis gravidarum is a severe form of nausea and vomiting that happens during pregnancy. Hyperemesis is worse than morning sickness. It may cause you to have nausea or vomiting all day for many days. It may keep you from eating and drinking enough food and liquids. Hyperemesis usually occurs during the first half (the first 20 weeks) of pregnancy. It often goes away once a woman is in her second half of pregnancy. However, sometimes hyperemesis continues through an entire pregnancy.   CAUSES   The cause of this condition is not completely known but is thought to be related to changes in the body's hormones when pregnant. It could be from the high level of the pregnancy hormone or an increase in estrogen in the body.   SIGNS AND SYMPTOMS    Severe nausea and vomiting.   Nausea that does not go away.   Vomiting that does not allow you to keep any food down.   Weight loss and body fluid loss (dehydration).   Having no desire to eat or not liking food you have previously enjoyed.  DIAGNOSIS   Your health care provider will do a physical exam and ask you about your symptoms. He or she may also order blood tests and urine tests to make sure something else is not causing the problem.   TREATMENT   You may only need medicine to control the problem. If medicines do not control the nausea and vomiting, you will be treated in the hospital to prevent dehydration, increased acid in the blood (acidosis), weight loss, and changes in the electrolytes in your body that may harm the unborn baby (fetus). You may need IV fluids.   HOME CARE INSTRUCTIONS    Only take over-the-counter or prescription medicines as directed by your health care provider.   Try eating a couple of dry crackers or toast in the morning before getting out of bed.   Avoid foods and smells that upset your stomach.   Avoid fatty and spicy foods.   Eat 5-6 small meals a day.   Do not drink when eating meals. Drink between  meals.   For snacks, eat high-protein foods, such as cheese.   Eat or suck on things that have ginger in them. Ginger helps nausea.   Avoid food preparation. The smell of food can spoil your appetite.   Avoid iron pills and iron in your multivitamins until after 3-4 months of being pregnant. However, consult with your health care provider before stopping any prescribed iron pills.  SEEK MEDICAL CARE IF:    Your abdominal pain increases.   You have a severe headache.   You have vision problems.   You are losing weight.  SEEK IMMEDIATE MEDICAL CARE IF:    You are unable to keep fluids down.   You vomit blood.   You have constant nausea and vomiting.   You have excessive weakness.   You have extreme thirst.   You have dizziness or fainting.   You have a fever or persistent symptoms for more than 2-3 days.   You have a fever and your symptoms suddenly get worse.  MAKE SURE YOU:    Understand these instructions.   Will watch your condition.   Will get help right away if you are not doing well or get worse.     This information is not intended to replace advice given to you by your health care provider. Make sure you discuss any questions you have with   your health care provider.     Document Released: 11/25/2005 Document Revised: 09/15/2013 Document Reviewed: 07/07/2013  Elsevier Interactive Patient Education 2016 Elsevier Inc.

## 2016-05-12 LAB — URINE CULTURE: Culture: NO GROWTH

## 2016-05-12 LAB — RPR: RPR Ser Ql: NONREACTIVE

## 2016-05-15 ENCOUNTER — Other Ambulatory Visit: Payer: Self-pay | Admitting: Advanced Practice Midwife

## 2016-05-15 DIAGNOSIS — Z369 Encounter for antenatal screening, unspecified: Secondary | ICD-10-CM

## 2016-05-20 ENCOUNTER — Ambulatory Visit
Admission: RE | Admit: 2016-05-20 | Discharge: 2016-05-20 | Disposition: A | Discharge status: Home / Self Care | Payer: Medicaid Other | Source: Ambulatory Visit | Attending: Advanced Practice Midwife | Admitting: Advanced Practice Midwife

## 2016-05-20 ENCOUNTER — Ambulatory Visit (HOSPITAL_BASED_OUTPATIENT_CLINIC_OR_DEPARTMENT_OTHER)
Admission: RE | Admit: 2016-05-20 | Discharge: 2016-05-20 | Disposition: A | Discharge status: Home / Self Care | Payer: Medicaid Other | Source: Ambulatory Visit | Attending: Obstetrics & Gynecology | Admitting: Obstetrics & Gynecology

## 2016-05-20 VITALS — BP 120/76 | HR 90 | Temp 98.4°F | Resp 18 | Ht 66.0 in | Wt 286.0 lb

## 2016-05-20 DIAGNOSIS — O99212 Obesity complicating pregnancy, second trimester: Secondary | ICD-10-CM

## 2016-05-20 DIAGNOSIS — Z369 Encounter for antenatal screening, unspecified: Secondary | ICD-10-CM | POA: Insufficient documentation

## 2016-05-20 DIAGNOSIS — Z3A12 12 weeks gestation of pregnancy: Secondary | ICD-10-CM | POA: Diagnosis not present

## 2016-05-20 DIAGNOSIS — Z36 Encounter for antenatal screening of mother: Secondary | ICD-10-CM | POA: Diagnosis present

## 2016-05-20 HISTORY — DX: Unspecified viral hepatitis B without hepatic coma: B19.10

## 2016-05-20 HISTORY — DX: Viral hepatitis complicating pregnancy, unspecified trimester: O98.419

## 2016-05-20 NOTE — Progress Notes (Signed)
Department, [place] [facility] Length of Consultation: 45 minutes   Regina Chandler  was referred to [facility] for genetic counseling to review prenatal screening and testing options.  This note summarizes the information we discussed.    We offered the following routine screening tests for this pregnancy:  First trimester screening, which includes nuchal translucency ultrasound screen and first trimester maternal serum marker screening.  The nuchal translucency has approximately an 80% detection rate for Down syndrome and can be positive for other chromosome abnormalities as well as congenital heart defects.  When combined with a maternal serum marker screening, the detection rate is up to 90% for Down syndrome and up to 97% for trisomy 18.     Maternal serum marker screening, a blood test that measures pregnancy proteins, can provide risk assessments for Down syndrome, trisomy 18, and open neural tube defects (spina bifida, anencephaly). Because it does not directly examine the fetus, it cannot positively diagnose or rule out these problems.  Targeted ultrasound uses high frequency sound waves to create an image of the developing fetus.  An ultrasound is often recommended as a routine means of evaluating the pregnancy.  It is also used to screen for fetal anatomy problems (for example, a heart defect) that might be suggestive of a chromosomal or other abnormality.   Should these screening tests indicate an increased concern, then the following additional testing options would be offered:  The chorionic villus sampling procedure is available for first trimester chromosome analysis.  This involves the withdrawal of a small amount of chorionic villi (tissue from the developing placenta).  Risk of pregnancy loss is estimated to be approximately 1 in 200 to 1 in 100 (0.5 to 1%).  There is approximately a 1% (1 in 100) chance that the CVS chromosome results will be unclear.  Chorionic villi  cannot be tested for neural tube defects.     Amniocentesis involves the removal of a small amount of amniotic fluid from the sac surrounding the fetus with the use of a thin needle inserted through the maternal abdomen and uterus.  Ultrasound guidance is used throughout the procedure.  Fetal cells from amniotic fluid are directly evaluated and > 99.5% of chromosome problems and > 98% of open neural tube defects can be detected. This procedure is generally performed after the 15th week of pregnancy.  The main risks to this procedure include complications leading to miscarriage in less than 1 in 200 cases (0.5%).  As another option for information if the pregnancy is suspected to be an an increased chance for certain chromosome conditions, we also reviewed the availability of cell free fetal DNA testing from maternal blood to determine whether or not the baby may have either Down syndrome, trisomy 46, or trisomy 31.  This test utilizes a maternal blood sample and DNA sequencing technology to isolate circulating cell free fetal DNA from maternal plasma.  The fetal DNA can then be analyzed for DNA sequences that are derived from the three most common chromosomes involved in aneuploidy, chromosomes 13, 18, and 21.  If the overall amount of DNA is greater than the expected level for any of these chromosomes, aneuploidy is suspected.  While we do not consider it a replacement for invasive testing and karyotype analysis, a negative result from this testing would be reassuring, though not a guarantee of a normal chromosome complement for the baby.  An abnormal result is certainly suggestive of an abnormal chromosome complement, though we would still recommend  CVS or amniocentesis to confirm any findings from this testing.   The patient is of primarily African American ancestry. Her partner is also Tree surgeon. A review of her records indicate that she is negative for screening for sickle cell at the [facility].  Based upon recent guidelines, Cystic Fibrosis and Spinal Muscular Atrophy (SMA) screening were also discussed with the patient. Both conditions are recessive, which means that both parents must be carriers in order to have a child with the disease.  Cystic fibrosis (CF) is one of the most common genetic conditions in persons of Caucasian ancestry.  This condition occurs in approximately 1 in 2,500 Caucasian persons and results in thickened secretions in the lungs, digestive, and reproductive systems.  For a baby to be at risk for having CF, both of the parents must be carriers for this condition.  Approximately 1 in 49 Caucasian persons is a carrier for CF, while 1 in 84 persons of African American background will be a carrier.  Current carrier testing looks for the most common mutations in the gene for CF and can detect approximately 90% of carriers in the Caucasian population and 81% of carriers in the [place] American population.  This means that the carrier screening can greatly reduce, but cannot eliminate, the chance for an individual to have a child with CF.  If an individual is found to be a carrier for CF, then carrier testing would be available for the partner. As part of [place]'s newborn screening profile, all babies born in the state of [place] will have a two-tier screening process.  Specimens are first tested to determine the concentration of immunoreactive trypsinogen (IRT).  The top 5% of specimens with the highest IRT values then undergo DNA testing using a panel of over 40 common CF mutations. SMA is a neurodegenerative disorder that leads to atrophy of skeletal muscle and overall weakness.  This condition is also more prevalent in the Caucasian population, with 1 in 40-1 in 60 persons being a carrier and 1 in 6,000-1 in 10,000 children being affected.  For those of African American background, the carrier frequency is 1 in 34 and the disease occurs in  approximately 1 in 20,000 children.  There are multiple forms of the disease, with some causing death in infancy to other forms with survival into adulthood.  The genetics of SMA is complex, but carrier screening can detect up to 95% of carriers in the Caucasian population and 70% in the African American population.  Similar to CF, a negative result can greatly reduce, but cannot eliminate, the chance to have a child with SMA.  We obtained a detailed family history and pregnancy history.  The patient reported that she, her mother, brother, uncle and son have all been diagnosed with mental health conditions including schizophrenia and bipolar. We discussed that these conditions are most often thought to be due to a combination of genetic and environmental or other factors, though in some families the genetic predisposition is clearly strong.  Though we would expect other family members to be at increased risk for developing a similar condition, there is currently no genetic testing for mental health disorders.  She also reported two paternal half brothers with autistic features, one of whom also has other concerns including differences in his gait.  Her father has another daughter who was born deaf with abnormally formed hands, which was attributed to her mother falling during pregnancy.  There was limited information available  on each of these relatives.  We talked about various genetic syndromes and other causes for autism as well as many different reasons for deafness.  We encouraged Regina Chandler to find out more information from her family about testing that may have been done on any of these relatives.  If more is learned, we are happy to talk further and attempt a risk assessment.  The remainder of the family history was reported to be unremarkable for birth defects, mental retardation, recurrent pregnancy loss or known chromosome abnormalities.  Regina Chandler indicated that this is her fifth pregnancy.  She had  two first trimester losses and has a healthy son and daughter.  She reported no complications or exposures in this pregnancy that would be expected to increase the risk for birth defects.  After consideration of the options, Ms. Brundage elected to proceed with first trimester screening as well as CF and SMA carrier testing.  An ultrasound was performed at the time of the visit.  The gestational age was consistent with  11 weeks.  Fetal anatomy could not be assessed due to early gestational age.  Please refer to the ultrasound report for details of that study.  Regina Chandler was encouraged to call with questions or concerns.  We can be contacted at [phone].   Cherly Andersoneborah F. Haleigh Desmith, MS, CGC

## 2016-05-23 ENCOUNTER — Telehealth: Payer: Self-pay | Admitting: Obstetrics and Gynecology

## 2016-05-23 NOTE — Telephone Encounter (Signed)
[name]  elected to undergo First Trimester screening on 05/20/2016.  To review, first trimester screening, includes nuchal translucency ultrasound screen and/or first trimester maternal serum marker screening.  The nuchal translucency has approximately an 80% detection rate for Down syndrome and can be positive for other chromosome abnormalities as well as heart defects.  When combined with a maternal serum marker screening, the detection rate is up to 90% for Down syndrome and up to 97% for trisomy 13 and 18.     The results of the First Trimester Nuchal Translucency and Biochemical Screening were within normal range.  The risk for Down syndrome is now estimated to be 1 in 5,858.  The risk for Trisomy 13/18 is less than 1 in 10,000.  Should more definitive information be desired, we would offer amniocentesis.  Because we do not yet know the effectiveness of combined first and second trimester screening, we do not recommend a maternal serum screen to assess the chance for chromosome conditions.  However, if screening for neural tube defects is desired, maternal serum screening for AFP only can be performed between 15 and [redacted] weeks gestation.    Cherly Andersoneborah F. Jleigh Striplin, MS, CGC

## 2016-05-24 LAB — MISC LABCORP TEST (SEND OUT): Labcorp test code: 450010

## 2016-05-27 ENCOUNTER — Telehealth: Payer: Self-pay | Admitting: Obstetrics and Gynecology

## 2016-05-27 LAB — CYSTIC FIBROSIS GENE TEST

## 2016-05-27 NOTE — Telephone Encounter (Signed)
  Regina Chandler elected to have carrier screening when she was seen at [facility], [facility].  The results of the screening tests for Cystic fibrosis (CF)  And Spinal Muscular Atrophy (SMA) are now available.    CF is a genetic condition that occurs most often in Caucasian persons.  It primarily affects the lungs, digestive, and reproductive systems.  For someone to be at risk for having CF, both of their parents must be carriers for CF.  The testing can detect many persons who are carriers for CF and therefore determine if the pregnancy is at an increased risk for this condition.  The blood test results were negative when examined for the 32 most common mutations (or changes) in the gene for CF.  This means that Regina Chandler does not carry any of the most common changes in this gene.  Testing for these 32 mutations detects approximately 69% of carriers who are African American.  Therefore, the chance that she is a carrier based on this negative result has been reduced from 1 in 65 to approximately 1 in 207.  Because this testing cannot detect all changes that may cause CF, we cannot eliminate the chance that this individual is a carrier completely.  The results of the SMA carrier screening are also available.  SMA is also a recessive genetic condition with variable age of onset and severity caused by mutations in the SMN1 gene.  This carrier testing assesses the number of copies of this gene.  Persons with one copy of the SMN1 gene are carriers, and those with no copies are affected with the condition.  Individuals with two or more copies have a reduced chance to be a carrier.  Not all mutations can be detected with this testing, though it can detect 94.8% of carriers in the Caucasian population and 70.5% of mutations in the African American population.  The results revealed that Regina Chandler has an SMN1 copy number of 2, thus reducing her chance to be a carrier from 1 in 7872 to 1 in  130.  Again, this testing cannot eliminate the chance to have a child with SMA, but dramatically reduces the chance.    We encouraged the patient to call with any questions or concerns as they arise.  We may be reached at [phone].  Cherly Andersoneborah F. Marlyss Cissell, MS, CGC

## 2016-05-31 ENCOUNTER — Ambulatory Visit: Admission: RE | Admit: 2016-05-31 | Payer: Self-pay | Source: Ambulatory Visit

## 2016-06-22 ENCOUNTER — Emergency Department: Payer: Medicaid Other

## 2016-06-22 ENCOUNTER — Emergency Department
Admission: EM | Admit: 2016-06-22 | Discharge: 2016-06-22 | Disposition: A | Discharge status: Home / Self Care | Payer: Medicaid Other | Attending: Emergency Medicine | Admitting: Emergency Medicine

## 2016-06-22 ENCOUNTER — Encounter: Payer: Self-pay | Admitting: Emergency Medicine

## 2016-06-22 DIAGNOSIS — Z87891 Personal history of nicotine dependence: Secondary | ICD-10-CM | POA: Diagnosis not present

## 2016-06-22 DIAGNOSIS — O200 Threatened abortion: Secondary | ICD-10-CM | POA: Diagnosis not present

## 2016-06-22 DIAGNOSIS — R1030 Lower abdominal pain, unspecified: Secondary | ICD-10-CM | POA: Diagnosis present

## 2016-06-22 DIAGNOSIS — Z3A15 15 weeks gestation of pregnancy: Secondary | ICD-10-CM | POA: Insufficient documentation

## 2016-06-22 LAB — URINALYSIS COMPLETE WITH MICROSCOPIC (ARMC ONLY)
Bilirubin Urine: NEGATIVE
Glucose, UA: NEGATIVE mg/dL
Hgb urine dipstick: NEGATIVE
Nitrite: NEGATIVE
Protein, ur: 30 mg/dL — AB
Specific Gravity, Urine: 1.02 (ref 1.005–1.030)
pH: 7 (ref 5.0–8.0)

## 2016-06-22 LAB — COMPREHENSIVE METABOLIC PANEL
ALT: 29 U/L (ref 14–54)
AST: 21 U/L (ref 15–41)
Albumin: 3.6 g/dL (ref 3.5–5.0)
Alkaline Phosphatase: 45 U/L (ref 38–126)
Anion gap: 6 (ref 5–15)
BUN: 5 mg/dL — ABNORMAL LOW (ref 6–20)
CO2: 24 mmol/L (ref 22–32)
Calcium: 8.6 mg/dL — ABNORMAL LOW (ref 8.9–10.3)
Chloride: 105 mmol/L (ref 101–111)
Creatinine, Ser: 0.35 mg/dL — ABNORMAL LOW (ref 0.44–1.00)
GFR calc Af Amer: >60 mL/min (ref >60)
GFR calc non Af Amer: >60 mL/min (ref >60)
Glucose, Bld: 97 mg/dL (ref 65–99)
Potassium: 3.3 mmol/L — ABNORMAL LOW (ref 3.5–5.1)
Sodium: 135 mmol/L (ref 135–145)
Total Bilirubin: 1 mg/dL (ref 0.3–1.2)
Total Protein: 7.2 g/dL (ref 6.5–8.1)

## 2016-06-22 LAB — CBC WITH DIFFERENTIAL/PLATELET
Basophils Absolute: 0 10*3/uL (ref 0–0.1)
Basophils Relative: 1 %
Eosinophils Absolute: 0 10*3/uL (ref 0–0.7)
Eosinophils Relative: 1 %
HCT: 35.6 % (ref 35.0–47.0)
Hemoglobin: 12.5 g/dL (ref 12.0–16.0)
Lymphocytes Relative: 28 %
Lymphs Abs: 1.5 10*3/uL (ref 1.0–3.6)
MCH: 31.2 pg (ref 26.0–34.0)
MCHC: 35.1 g/dL (ref 32.0–36.0)
MCV: 89 fL (ref 80.0–100.0)
Monocytes Absolute: 0.4 10*3/uL (ref 0.2–0.9)
Monocytes Relative: 7 %
Neutro Abs: 3.4 10*3/uL (ref 1.4–6.5)
Neutrophils Relative %: 63 %
Platelets: 188 10*3/uL (ref 150–440)
RBC: 4 MIL/uL (ref 3.80–5.20)
RDW: 13.8 % (ref 11.5–14.5)
WBC: 5.3 10*3/uL (ref 3.6–11.0)

## 2016-06-22 LAB — ABO/RH: ABO/RH(D): O POS

## 2016-06-22 LAB — HCG, QUANTITATIVE, PREGNANCY: hCG, Beta Chain, Quant, S: 29986 m[IU]/mL — ABNORMAL HIGH (ref <5)

## 2016-06-22 NOTE — ED Notes (Signed)
Patient states that she slipped and fell 2 days ago, landing on her bottom. Patient denies hitting head or LOC. Patient states that since she fell she has been having headaches and lower abdominal pain on the right and left side. Patient has also noted some minor spotting of light pink blood. Patient is [redacted] weeks pregnant.

## 2016-06-22 NOTE — Discharge Instructions (Signed)
Threatened Miscarriage °A threatened miscarriage occurs when you have vaginal bleeding during your first 20 weeks of pregnancy but the pregnancy has not ended. If you have vaginal bleeding during this time, your health care provider will do tests to make sure you are still pregnant. If the tests show you are still pregnant and the developing baby (fetus) inside your womb (uterus) is still growing, your condition is considered a threatened miscarriage. °A threatened miscarriage does not mean your pregnancy will end, but it does increase the risk of losing your pregnancy (complete miscarriage). °CAUSES  °The cause of a threatened miscarriage is usually not known. If you go on to have a complete miscarriage, the most common cause is an abnormal number of chromosomes in the developing baby. Chromosomes are the structures inside cells that hold all your genetic material. °Some causes of vaginal bleeding that do not result in miscarriage include: °· Having sex. °· Having an infection. °· Normal hormone changes of pregnancy. °· Bleeding that occurs when an egg implants in your uterus. °RISK FACTORS °Risk factors for bleeding in early pregnancy include: °· Obesity. °· Smoking. °· Drinking excessive amounts of alcohol or caffeine. °· Recreational drug use. °SIGNS AND SYMPTOMS °· Light vaginal bleeding. °· Mild abdominal pain or cramps. °DIAGNOSIS  °If you have bleeding with or without abdominal pain before 20 weeks of pregnancy, your health care provider will do tests to check whether you are still pregnant. One important test involves using sound waves and a computer (ultrasound) to create images of the inside of your uterus. Other tests include an internal exam of your vagina and uterus (pelvic exam) and measurement of your baby's heart rate.  °You may be diagnosed with a threatened miscarriage if: °· Ultrasound testing shows you are still pregnant. °· Your baby's heart rate is strong. °· A pelvic exam shows that the  opening between your uterus and your vagina (cervix) is closed. °· Your heart rate and blood pressure are stable. °· Blood tests confirm you are still pregnant. °TREATMENT  °No treatments have been shown to prevent a threatened miscarriage from going on to a complete miscarriage. However, the right home care is important.  °HOME CARE INSTRUCTIONS  °· Make sure you keep all your appointments for prenatal care. This is very important. °· Get plenty of rest. °· Do not have sex or use tampons if you have vaginal bleeding. °· Do not douche. °· Do not smoke or use recreational drugs. °· Do not drink alcohol. °· Avoid caffeine. °SEEK MEDICAL CARE IF: °· You have light vaginal bleeding or spotting while pregnant. °· You have abdominal pain or cramping. °· You have a fever. °SEEK IMMEDIATE MEDICAL CARE IF: °· You have heavy vaginal bleeding. °· You have blood clots coming from your vagina. °· You have severe low back pain or abdominal cramps. °· You have fever, chills, and severe abdominal pain. °MAKE SURE YOU: °· Understand these instructions. °· Will watch your condition. °· Will get help right away if you are not doing well or get worse. °  °This information is not intended to replace advice given to you by your health care provider. Make sure you discuss any questions you have with your health care provider. °  °Document Released: 11/25/2005 Document Revised: 11/30/2013 Document Reviewed: 09/21/2013 °Elsevier Interactive Patient Education ©2016 Elsevier Inc. ° °Vaginal Bleeding During Pregnancy, First Trimester °A small amount of bleeding (spotting) from the vagina is relatively common in early pregnancy. It usually stops on its own.   Various things may cause bleeding or spotting in early pregnancy. Some bleeding may be related to the pregnancy, and some may not. In most cases, the bleeding is normal and is not a problem. However, bleeding can also be a sign of something serious. Be sure to tell your health care provider  about any vaginal bleeding right away. °Some possible causes of vaginal bleeding during the first trimester include: °· Infection or inflammation of the cervix. °· Growths (polyps) on the cervix. °· Miscarriage or threatened miscarriage. °· Pregnancy tissue has developed outside of the uterus and in a fallopian tube (tubal pregnancy). °· Tiny cysts have developed in the uterus instead of pregnancy tissue (molar pregnancy). °HOME CARE INSTRUCTIONS  °Watch your condition for any changes. The following actions may help to lessen any discomfort you are feeling: °· Follow your health care provider's instructions for limiting your activity. If your health care provider orders bed rest, you may need to stay in bed and only get up to use the bathroom. However, your health care provider may allow you to continue light activity. °· If needed, make plans for someone to help with your regular activities and responsibilities while you are on bed rest. °· Keep track of the number of pads you use each day, how often you change pads, and how soaked (saturated) they are. Write this down. °· Do not use tampons. Do not douche. °· Do not have sexual intercourse or orgasms until approved by your health care provider. °· If you pass any tissue from your vagina, save the tissue so you can show it to your health care provider. °· Only take over-the-counter or prescription medicines as directed by your health care provider. °· Do not take aspirin because it can make you bleed. °· Keep all follow-up appointments as directed by your health care provider. °SEEK MEDICAL CARE IF: °· You have any vaginal bleeding during any part of your pregnancy. °· You have cramps or labor pains. °· You have a fever, not controlled by medicine. °SEEK IMMEDIATE MEDICAL CARE IF:  °· You have severe cramps in your back or belly (abdomen). °· You pass large clots or tissue from your vagina. °· Your bleeding increases. °· You feel light-headed or weak, or you have  fainting episodes. °· You have chills. °· You are leaking fluid or have a gush of fluid from your vagina. °· You pass out while having a bowel movement. °MAKE SURE YOU: °· Understand these instructions. °· Will watch your condition. °· Will get help right away if you are not doing well or get worse. °  °This information is not intended to replace advice given to you by your health care provider. Make sure you discuss any questions you have with your health care provider. °  °Document Released: 09/04/2005 Document Revised: 11/30/2013 Document Reviewed: 08/02/2013 °Elsevier Interactive Patient Education ©2016 Elsevier Inc. ° °

## 2016-06-22 NOTE — ED Notes (Signed)
[redacted] weeks pregnant, fell 2 days ago, abdominal pain since. Spotting blood but no fluid. Headache.

## 2016-06-22 NOTE — ED Provider Notes (Signed)
[facility] Emergency Department Provider Note  Time seen: 8:02 AM  I have reviewed the triage vital signs and the nursing notes.   HISTORY  Chief Complaint Abdominal Pain    HPI Regina Chandler is a 25 y.o. female G5 P2 who presents the emergency department [redacted] weeks pregnant with lower abdominal pain and vaginal spotting. According to the patient she is [redacted] weeks pregnant, she fell onto her buttocks 2 days ago. She states later that day she noticed mild spotting, but has not had any since. Denies any other injuries besides mild lower abdominal pain mild buttock pain. Denies hitting her head or LOC. Patient states she continues to have the mild lower abdominal pain which concerned her so she came to the emergency department for evaluation. She is followed by the health Department.     Past Medical History  Diagnosis Date  . Hepatitis B affecting pregnancy 2007    No current treatment required, titers show up negative    Patient Active Problem List   Diagnosis Date Noted  . First trimester screening 05/20/2016    Past Surgical History  Procedure Laterality Date  . No past surgeries      Current Outpatient Rx  Name  Route  Sig  Dispense  Refill  . Doxylamine-Pyridoxine (DICLEGIS) 10-10 MG TBEC      2 tabs orally at hs (Day 1). If this works, continue taking 2 tab qhs. However, if nausea persists Day 2, take 2 tabs at bedtime that night then take three tablets starting on Day 3 (one tablet in the morning and two tablets at bedtime). If these 3 tablets control symptoms on Day 4, continue taking 3 tabs daily. Otherwise take 4 tabs starting on Day 4 (one tablet in the morning, one tablet mid-afternoon and two tablets at bedtime).   60 tablet   2   . Prenatal Vit-Fe Fumarate-FA (PRENATAL MULTIVITAMIN) TABS tablet   Oral   Take 1 tablet by mouth daily at 12 noon.           Allergies Penicillins  No family history on file.  Social  History Social History  Substance Use Topics  . Smoking status: Former Games developer  . Smokeless tobacco: Never Used  . Alcohol Use: No    Review of Systems Constitutional: Negative for fever. Cardiovascular: Negative for chest pain. Respiratory: Negative for shortness of breath. Gastrointestinal: Mild lower abdominal pain Genitourinary: Negative for dysuria. Vaginal spotting 2 days ago, none since. Musculoskeletal: Mild lower back pain. Neurological: Negative for headache 10-point ROS otherwise negative.  ____________________________________________   PHYSICAL EXAM:  VITAL SIGNS: ED Triage Vitals  Enc Vitals Group     BP 06/22/16 0725 129/85 mmHg     Pulse Rate 06/22/16 0725 76     Resp 06/22/16 0725 20     Temp 06/22/16 0725 98.1 F (36.7 C)     Temp Source 06/22/16 0725 Oral     SpO2 06/22/16 0725 97 %     Weight 06/22/16 0725 292 lb (132.45 kg)     Height 06/22/16 0725  (1.676 m)     Head Cir --      Peak Flow --      Pain Score 06/22/16 0727 9     Pain Loc --      Pain Edu? --      Excl. in GC? --     Constitutional: Alert and oriented. Well appearing and in no distress. Eyes: Normal exam ENT  Head: Normocephalic and atraumatic.   Mouth/Throat: Mucous membranes are moist. Cardiovascular: Normal rate, regular rhythm. No murmur Respiratory: Normal respiratory effort without tachypnea nor retractions. Breath sounds are clear and equal bilaterally. No wheezes/rales/rhonchi. Gastrointestinal: Soft and nontender. No distention.   Musculoskeletal: Nontender with normal range of motion in all extremities. Neurologic:  Normal speech and language. No gross focal neurologic deficits  Skin:  Skin is warm, dry and intact.  Psychiatric: Mood and affect are normal.   ____________________________________________    RADIOLOGY  Ultrasound shows 15 week 4 day IUP with cardiac activity, possible small  abruption.  ____________________________________________   INITIAL IMPRESSION / ASSESSMENT AND PLAN / ED COURSE  Pertinent labs & imaging results that were available during my care of the patient were reviewed by me and considered in my medical decision making (see chart for details).  The patient presents the emergency department with a threatened miscarriage, spotting 2 days ago. Mild lower abdominal discomfort, she states the pain has not worsened, but it did not go away which concerned her so she came to the emergency department for evaluation. We'll obtain labs, OB ultrasound. Patient agreeable to plan.  Ultrasound shows possible abruption otherwise normal pregnancy. I discussed this with Dr. Jeani HawkingStahbler, who reviewed the ultrasound and believes that is more consistent with a subchorionic hemorrhage. States outpatient follow-up is appropriate. I discussed this with the patient will follow-up with the health Department, and also discussed strict return precautions for increased vaginal bleeding or pelvic pain. Patient agreeable to this plan.  ____________________________________________   FINAL CLINICAL IMPRESSION(S) / ED DIAGNOSES  Threatened miscarriage   Minna AntisKevin Cally Nygard, MD 06/22/16 [id number]

## 2016-07-08 ENCOUNTER — Ambulatory Visit
Admission: RE | Admit: 2016-07-08 | Discharge: 2016-07-08 | Disposition: A | Discharge status: Home / Self Care | Payer: Medicaid Other | Source: Ambulatory Visit | Attending: Obstetrics and Gynecology | Admitting: Obstetrics and Gynecology

## 2016-07-08 ENCOUNTER — Other Ambulatory Visit: Payer: Self-pay | Admitting: Obstetrics and Gynecology

## 2016-07-08 ENCOUNTER — Other Ambulatory Visit: Payer: Self-pay

## 2016-07-08 DIAGNOSIS — O328XX0 Maternal care for other malpresentation of fetus, not applicable or unspecified: Secondary | ICD-10-CM | POA: Diagnosis not present

## 2016-07-08 DIAGNOSIS — E669 Obesity, unspecified: Secondary | ICD-10-CM | POA: Diagnosis not present

## 2016-07-08 DIAGNOSIS — Z0489 Encounter for examination and observation for other specified reasons: Secondary | ICD-10-CM

## 2016-07-08 DIAGNOSIS — O99212 Obesity complicating pregnancy, second trimester: Secondary | ICD-10-CM | POA: Diagnosis present

## 2016-07-08 DIAGNOSIS — Z3A18 18 weeks gestation of pregnancy: Secondary | ICD-10-CM | POA: Insufficient documentation

## 2016-07-08 DIAGNOSIS — O219 Vomiting of pregnancy, unspecified: Secondary | ICD-10-CM

## 2016-07-08 DIAGNOSIS — IMO0002 Reserved for concepts with insufficient information to code with codable children: Secondary | ICD-10-CM

## 2016-07-08 MED ORDER — DOCUSATE SODIUM 100 MG PO CAPS: 100.0000 mg | ORAL_CAPSULE | Freq: Two times a day (BID) | ORAL | Status: DC

## 2016-07-08 MED ORDER — ONDANSETRON 4 MG PO TBDP: 4.0000 mg | ORAL_TABLET | Freq: Three times a day (TID) | ORAL | 0 refills | Status: DC | PRN

## 2016-07-08 NOTE — Progress Notes (Signed)
Pt requested meds for N&V diclegis not working  zofran ODT sent in recommended docusate with it

## 2016-08-01 NOTE — Progress Notes (Signed)
I was present and readily available during this consult.  I agree with the note and plan.  Janeann ForehandS. Myshawn Chiriboga, MD

## 2016-08-01 NOTE — Addendum Note (Signed)
Encounter addended by: Oran ReinSarahn Alvilda Mckenna, MD on: 08/01/2016  7:01 PM<BR>    Actions taken: Sign clinical note, Charge Capture section accepted

## 2016-08-05 ENCOUNTER — Ambulatory Visit
Admission: RE | Admit: 2016-08-05 | Discharge: 2016-08-05 | Disposition: A | Discharge status: Home / Self Care | Payer: Medicaid Other | Source: Ambulatory Visit | Attending: Obstetrics & Gynecology | Admitting: Obstetrics & Gynecology

## 2016-08-05 DIAGNOSIS — O99212 Obesity complicating pregnancy, second trimester: Secondary | ICD-10-CM | POA: Insufficient documentation

## 2016-08-05 DIAGNOSIS — O0990 Supervision of high risk pregnancy, unspecified, unspecified trimester: Secondary | ICD-10-CM

## 2016-08-05 DIAGNOSIS — Z3A22 22 weeks gestation of pregnancy: Secondary | ICD-10-CM | POA: Insufficient documentation

## 2016-08-05 DIAGNOSIS — O99210 Obesity complicating pregnancy, unspecified trimester: Secondary | ICD-10-CM

## 2016-08-05 DIAGNOSIS — O321XX0 Maternal care for breech presentation, not applicable or unspecified: Secondary | ICD-10-CM | POA: Insufficient documentation

## 2016-09-13 LAB — OB RESULTS CONSOLE HIV ANTIBODY (ROUTINE TESTING): HIV: NONREACTIVE

## 2016-09-14 LAB — OB RESULTS CONSOLE RPR: RPR: NONREACTIVE

## 2016-09-15 LAB — OB RESULTS CONSOLE GC/CHLAMYDIA
Chlamydia: NEGATIVE
Gonorrhea: NEGATIVE

## 2016-09-23 ENCOUNTER — Ambulatory Visit
Admission: RE | Admit: 2016-09-23 | Discharge: 2016-09-23 | Disposition: A | Discharge status: Home / Self Care | Payer: Medicaid Other | Source: Ambulatory Visit | Attending: Obstetrics & Gynecology | Admitting: Obstetrics & Gynecology

## 2016-09-23 ENCOUNTER — Other Ambulatory Visit: Payer: Self-pay | Admitting: Obstetrics & Gynecology

## 2016-09-23 VITALS — BP 128/68 | HR 83 | Temp 98.4°F | Resp 18 | Ht 66.0 in | Wt 284.0 lb

## 2016-09-23 DIAGNOSIS — Z3A29 29 weeks gestation of pregnancy: Secondary | ICD-10-CM | POA: Insufficient documentation

## 2016-09-23 DIAGNOSIS — O0001 Abdominal pregnancy with intrauterine pregnancy: Secondary | ICD-10-CM

## 2016-09-23 DIAGNOSIS — O99213 Obesity complicating pregnancy, third trimester: Secondary | ICD-10-CM

## 2016-09-23 DIAGNOSIS — Z369 Encounter for antenatal screening, unspecified: Secondary | ICD-10-CM

## 2016-10-14 ENCOUNTER — Inpatient Hospital Stay: Admission: RE | Admit: 2016-10-14 | Payer: Medicaid Other | Source: Ambulatory Visit

## 2016-10-18 ENCOUNTER — Observation Stay
Admission: EM | Admit: 2016-10-18 | Discharge: 2016-10-18 | Disposition: A | Discharge status: Home / Self Care | Payer: Medicaid Other | Attending: Obstetrics & Gynecology | Admitting: Obstetrics & Gynecology

## 2016-10-18 DIAGNOSIS — O99212 Obesity complicating pregnancy, second trimester: Secondary | ICD-10-CM | POA: Diagnosis present

## 2016-10-18 DIAGNOSIS — O99213 Obesity complicating pregnancy, third trimester: Secondary | ICD-10-CM | POA: Diagnosis not present

## 2016-10-18 MED ORDER — ONDANSETRON HCL 4 MG/2ML IJ SOLN: 4.0000 mg | Freq: Four times a day (QID) | INTRAMUSCULAR | Status: DC | PRN

## 2016-10-18 MED ORDER — ACETAMINOPHEN 325 MG PO TABS: 650.0000 mg | ORAL_TABLET | ORAL | Status: DC | PRN

## 2016-10-18 NOTE — Discharge Summary (Signed)
  See FPN 

## 2016-10-18 NOTE — Discharge Instructions (Signed)
Discharge instructions reviewed with patient, patient verbalized understanding, signed copy and copy given.  Labor precautions discussed with patient. Next scheduled NST:  November 17th, November 24th and December 1st at 0900.  Pt. In agreement.

## 2016-10-18 NOTE — Final Progress Note (Signed)
Physician Final Progress Note  Patient ID: Regina Chandler MRN: [id number] DOB/AGE: [birth year] 25 y.o.  Admit date: 10/18/2016 Admitting provider: Nadara Mustardobert P Adlai Sinning, MD Discharge date: 10/18/2016   Admission Diagnoses: Obesity, third trimester  Discharge Diagnoses:  Active Problems:   Obesity affecting pregnancy in third trimester  Consults: None  Significant Findings/ Diagnostic Studies: A NST procedure was performed with FHR monitoring and a normal baseline established, appropriate time of 20-40 minutes of evaluation, and accels >2 seen w 15x15 characteristics.  Results show a REACTIVE NST.   Discharge Condition: good  Disposition: 01-Home or Self Care  Diet: Regular diet  Discharge Activity: Activity as tolerated     Medication List    TAKE these medications   Doxylamine-Pyridoxine 10-10 MG Tbec Commonly known as:  DICLEGIS 2 tabs orally at hs (Day 1). If this works, continue taking 2 tab qhs. However, if nausea persists Day 2, take 2 tabs at bedtime that night then take three tablets starting on Day 3 (one tablet in the morning and two tablets at bedtime). If these 3 tablets control symptoms on Day 4, continue taking 3 tabs daily. Otherwise take 4 tabs starting on Day 4 (one tablet in the morning, one tablet mid-afternoon and two tablets at bedtime).   ondansetron 4 MG disintegrating tablet Commonly known as:  ZOFRAN ODT Take 1 tablet (4 mg total) by mouth every 8 (eight) hours as needed for nausea or vomiting.   prenatal multivitamin Tabs tablet Take 1 tablet by mouth daily at 12 noon.   ranitidine 150 MG tablet Commonly known as:  ZANTAC Take 150 mg by mouth 2 (two) times daily.        Total time spent taking care of this patient: 15 minutes  Signed: Letitia Libraobert Paul Dudley Cooley 10/18/2016, 11:03 AM

## 2016-10-18 NOTE — Progress Notes (Signed)
Pt. Here for scheduled NST.  U/S applied - FHR 130s, toco applied - soft abd. Denies sudden gush of fluid, or vaginal bleeding.  Positive for fetal movement per patient.

## 2016-10-25 ENCOUNTER — Observation Stay
Admission: EM | Admit: 2016-10-25 | Discharge: 2016-10-25 | Disposition: A | Discharge status: Home / Self Care | Payer: Medicaid Other | Attending: Obstetrics and Gynecology | Admitting: Obstetrics and Gynecology

## 2016-10-25 ENCOUNTER — Encounter: Payer: Self-pay | Admitting: *Deleted

## 2016-10-25 DIAGNOSIS — O99213 Obesity complicating pregnancy, third trimester: Secondary | ICD-10-CM | POA: Diagnosis not present

## 2016-10-25 DIAGNOSIS — Z6841 Body Mass Index (BMI) 40.0 and over, adult: Secondary | ICD-10-CM | POA: Insufficient documentation

## 2016-10-25 DIAGNOSIS — E669 Obesity, unspecified: Secondary | ICD-10-CM | POA: Insufficient documentation

## 2016-10-25 NOTE — Final Progress Note (Signed)
Physician Final Progress Note  Patient ID: Regina Chandler MRN: [id number] DOB/AGE: [birth year] 25 y.o.  Admit date: 10/25/2016 Admitting provider: Vena AustriaAndreas Oleta Gunnoe, MD Discharge date: 10/25/2016   Admission Diagnoses: NST for obesity  Discharge Diagnoses:  Active Problems:   Obesity affecting pregnancy in third trimester, antepartum   Consults: None  Significant Findings/ Diagnostic Studies: none  Procedures: NST 120, moderate, +accels, no decels - reactive NST  Discharge Condition: good  Disposition: 01-Home or Self Care  Diet: Regular diet  Discharge Activity: Activity as tolerated  Discharge Instructions    Discharge activity:  No Restrictions    Complete by:  As directed    Fetal Kick Count:  Lie on our left side for one hour after a meal, and count the number of times your baby kicks.  If it is less than 5 times, get up, move around and drink some juice.  Repeat the test 30 minutes later.  If it is still less than 5 kicks in an hour, notify your doctor.    Complete by:  As directed    No sexual activity restrictions    Complete by:  As directed    Notify physician for a general feeling that "something is not right"    Complete by:  As directed    Notify physician for increase or change in vaginal discharge    Complete by:  As directed    Notify physician for intestinal cramps, with or without diarrhea, sometimes described as "gas pain"    Complete by:  As directed    Notify physician for leaking of fluid    Complete by:  As directed    Notify physician for low, dull backache, unrelieved by heat or Tylenol    Complete by:  As directed    Notify physician for menstrual like cramps    Complete by:  As directed    Notify physician for pelvic pressure    Complete by:  As directed    Notify physician for uterine contractions.  These may be painless and feel like the uterus is tightening or the baby is  "balling up"    Complete by:  As directed    Notify  physician for vaginal bleeding    Complete by:  As directed    PRETERM LABOR:  Includes any of the follwing symptoms that occur between 20 - [redacted] weeks gestation.  If these symptoms are not stopped, preterm labor can result in preterm delivery, placing your baby at risk    Complete by:  As directed        Medication List    TAKE these medications   Doxylamine-Pyridoxine 10-10 MG Tbec Commonly known as:  DICLEGIS 2 tabs orally at hs (Day 1). If this works, continue taking 2 tab qhs. However, if nausea persists Day 2, take 2 tabs at bedtime that night then take three tablets starting on Day 3 (one tablet in the morning and two tablets at bedtime). If these 3 tablets control symptoms on Day 4, continue taking 3 tabs daily. Otherwise take 4 tabs starting on Day 4 (one tablet in the morning, one tablet mid-afternoon and two tablets at bedtime).   ondansetron 4 MG disintegrating tablet Commonly known as:  ZOFRAN ODT Take 1 tablet (4 mg total) by mouth every 8 (eight) hours as needed for nausea or vomiting.   prenatal multivitamin Tabs tablet Take 1 tablet by mouth daily at 12 noon.   ranitidine 150 MG tablet Commonly known as:  ZANTAC  Take 150 mg by mouth 2 (two) times daily.        Total time spent taking care of this patient: 15 minutes  Signed: Lorrene ReidSTAEBLER, Zainab Crumrine M 10/25/2016, 4:30 PM

## 2016-11-01 ENCOUNTER — Inpatient Hospital Stay
Admission: RE | Admit: 2016-11-01 | Discharge: 2016-11-01 | Disposition: A | Discharge status: Home / Self Care | Payer: Medicaid Other | Source: Intra-hospital | Attending: Obstetrics and Gynecology | Admitting: Obstetrics and Gynecology

## 2016-11-01 ENCOUNTER — Encounter: Payer: Self-pay | Admitting: *Deleted

## 2016-11-01 NOTE — Discharge Summary (Signed)
Pt d/c'd to home after reactive NST. Given d/c instructions and follow up NST made for one week from today. Pt verbalized understanding.

## 2016-11-01 NOTE — Discharge Summary (Signed)
See final progress note. 

## 2016-11-01 NOTE — OB Triage Note (Signed)
G5P3 presents at 4746w4d for scheduled NST for obesity. [facility]. C/o headache and aches and pains, unrelieved by Tylenol. +FM, no LOF or VB.

## 2016-11-01 NOTE — Discharge Instructions (Signed)
Return to [facility] for your next NST 11/08/16 at 4:00 pm.

## 2016-11-01 NOTE — Final Progress Note (Signed)
Physician Final Progress Note  Patient ID: Regina Chandler MRN: [id number] DOB/AGE: [birth year] 25 y.o.  Admit date: 11/01/2016 Admitting provider: Vena AustriaAndreas Evalette Montrose, MD Discharge date: 11/01/2016   Admission Diagnoses: Obesity antepartum in the third trimester  Discharge Diagnoses: Obesity antepartum in the third trimester  25 yo [id number] at 9278w4d presenting for schedule NST secondary to obesity complicating preganancy   Consults: None  Significant Findings/ Diagnostic Studies: none  Procedures: NST reactive category I tracing 130, moderate, +accels, no decels, toco irregular  Discharge Condition: good  Disposition: 01-Home or Self Care  Diet: Regular diet  Discharge Activity: Activity as tolerated   Total time spent taking care of this patient: 10 minutes  Signed: Lorrene ReidSTAEBLER, [name] 11/01/2016, 1:35 PM

## 2016-11-06 ENCOUNTER — Encounter: Payer: Self-pay | Admitting: *Deleted

## 2016-11-06 ENCOUNTER — Observation Stay
Admission: EM | Admit: 2016-11-06 | Discharge: 2016-11-06 | Disposition: A | Discharge status: Home / Self Care | Payer: Medicaid Other | Attending: Obstetrics & Gynecology | Admitting: Obstetrics & Gynecology

## 2016-11-06 DIAGNOSIS — O99213 Obesity complicating pregnancy, third trimester: Secondary | ICD-10-CM

## 2016-11-06 DIAGNOSIS — Z369 Encounter for antenatal screening, unspecified: Secondary | ICD-10-CM

## 2016-11-06 DIAGNOSIS — Z3A35 35 weeks gestation of pregnancy: Secondary | ICD-10-CM | POA: Diagnosis not present

## 2016-11-06 DIAGNOSIS — R51 Headache: Secondary | ICD-10-CM | POA: Insufficient documentation

## 2016-11-06 DIAGNOSIS — O26893 Other specified pregnancy related conditions, third trimester: Principal | ICD-10-CM | POA: Insufficient documentation

## 2016-11-06 DIAGNOSIS — R102 Pelvic and perineal pain: Secondary | ICD-10-CM | POA: Insufficient documentation

## 2016-11-06 DIAGNOSIS — R1030 Lower abdominal pain, unspecified: Secondary | ICD-10-CM | POA: Diagnosis not present

## 2016-11-06 LAB — URINALYSIS COMPLETE WITH MICROSCOPIC (ARMC ONLY)
Bacteria, UA: NONE SEEN
Bilirubin Urine: NEGATIVE
Glucose, UA: NEGATIVE mg/dL
Hgb urine dipstick: NEGATIVE
Nitrite: NEGATIVE
Protein, ur: NEGATIVE mg/dL
Specific Gravity, Urine: 1.017 (ref 1.005–1.030)
pH: 6 (ref 5.0–8.0)

## 2016-11-06 LAB — URINE DRUG SCREEN, QUALITATIVE (ARMC ONLY)
Amphetamines, Ur Screen: NOT DETECTED
Barbiturates, Ur Screen: NOT DETECTED
Benzodiazepine, Ur Scrn: NOT DETECTED
Cannabinoid 50 Ng, Ur ~~LOC~~: NOT DETECTED
Cocaine Metabolite,Ur ~~LOC~~: NOT DETECTED
MDMA (Ecstasy)Ur Screen: NOT DETECTED
Methadone Scn, Ur: NOT DETECTED
Opiate, Ur Screen: NOT DETECTED
Phencyclidine (PCP) Ur S: NOT DETECTED
Tricyclic, Ur Screen: NOT DETECTED

## 2016-11-06 NOTE — Discharge Summary (Signed)
Physician Discharge Summary  Patient ID: Regina Fowlernastasia J Paino MRN: [id number] DOB/AGE: 09-02-1991 25 y.o.  Admit date: 11/06/2016 Discharge date: 11/06/2016  Admission Diagnoses: headache, lower abdominal pain  Discharge Diagnoses: headache, round ligament pain, lower abdominal pain  Discharged Condition: good  Hospital Course: Pt seen and examined for headache (no s/[place] preclampsia) and lower abdominal pain (most c/w round ligament pain, no s/sx PTL).  Stable for discharge, outpatient mgt, appt Friday.  Significant Diagnostic Studies: A NST procedure was performed with FHR monitoring and a normal baseline established, appropriate time of 20-40 minutes of evaluation, and accels >2 seen w 15x15 characteristics.  Results show a REACTIVE NST.   Treatments: none  Discharge Exam: Blood pressure 131/77, pulse 83, temperature 98.1 F (36.7 C), temperature source Oral, resp. rate 18, height 5\' 6"  (1.676 m), weight 286 lb (129.7 kg), last menstrual period 02/25/2016. per H&P  Disposition: 01-Home or Self Care     Medication List    TAKE these medications   Doxylamine-Pyridoxine 10-10 MG Tbec Commonly known as:  DICLEGIS 2 tabs orally at hs (Day 1). If this works, continue taking 2 tab qhs. However, if nausea persists Day 2, take 2 tabs at bedtime that night then take three tablets starting on Day 3 (one tablet in the morning and two tablets at bedtime). If these 3 tablets control symptoms on Day 4, continue taking 3 tabs daily. Otherwise take 4 tabs starting on Day 4 (one tablet in the morning, one tablet mid-afternoon and two tablets at bedtime).   ondansetron 4 MG disintegrating tablet Commonly known as:  ZOFRAN ODT Take 1 tablet (4 mg total) by mouth every 8 (eight) hours as needed for nausea or vomiting.   prenatal multivitamin Tabs tablet Take 1 tablet by mouth daily at 12 noon.   ranitidine 150 MG tablet Commonly known as:  ZANTAC Take 150 mg by mouth 2 (two) times daily.         Signed: Letitia LibraRobert Paul Harris 11/06/2016, 6:51 PM

## 2016-11-06 NOTE — Discharge Instructions (Signed)
Call provider or return to birthplace with: ? ?1. Regular contractions ?2. Leaking of fluid from your vagina ?3. Vaginal bleeding: Bright red or heavy like a period ?4. Decreased Fetal movement  ?

## 2016-11-06 NOTE — H&P (Signed)
Obstetrics Admission History & Physical   Headache and pelvic pressure   HPI:  25 y.o. [id number] @ 5854w2d (12/09/2016, Alternate EDD Entry). Admitted on 11/06/2016:   Patient Active Problem List   Diagnosis Date Noted  . Indication for care in labor and delivery, antepartum 11/06/2016  . Obesity affecting pregnancy in third trimester, antepartum 10/25/2016  . Obesity affecting pregnancy in third trimester 10/18/2016  . Nausea and vomiting of pregnancy, antepartum 07/08/2016  . First trimester screening 05/20/2016     Presents for headache and lower abdiominal pain, worse today although she has had headache off and on this pregnancy.  Pain is lower abd intermittant moderate no radiation no alleviating factors and no other assoc sx's.  Good FM, no VB or ROM.  Prenatal care at: at [facility]  PMHx:  Past Medical History:  Diagnosis Date  . Hepatitis B affecting pregnancy 2007   No current treatment required, titers show up negative   PSHx:  Past Surgical History:  Procedure Laterality Date  . NO PAST SURGERIES     Medications:  Facility-Administered Medications Prior to Admission  Medication Dose Route Frequency Provider Last Rate Last Dose  . docusate sodium (COLACE) capsule 100 mg  100 mg Oral BID Jimmey RalphElizabeth Livingston, MD       Prescriptions Prior to Admission  Medication Sig Dispense Refill Last Dose  . Doxylamine-Pyridoxine (DICLEGIS) 10-10 MG TBEC 2 tabs orally at hs (Day 1). If this works, continue taking 2 tab qhs. However, if nausea persists Day 2, take 2 tabs at bedtime that night then take three tablets starting on Day 3 (one tablet in the morning and two tablets at bedtime). If these 3 tablets control symptoms on Day 4, continue taking 3 tabs daily. Otherwise take 4 tabs starting on Day 4 (one tablet in the morning, one tablet mid-afternoon and two tablets at bedtime). 60 tablet 2 11/05/2016 at Unknown time  . ondansetron (ZOFRAN ODT) 4 MG disintegrating tablet Take 1 tablet (4 mg  total) by mouth every 8 (eight) hours as needed for nausea or vomiting. 20 tablet 0 11/06/2016 at Unknown time  . Prenatal Vit-Fe Fumarate-FA (PRENATAL MULTIVITAMIN) TABS tablet Take 1 tablet by mouth daily at 12 noon.   11/06/2016 at Unknown time  . ranitidine (ZANTAC) 150 MG tablet Take 150 mg by mouth 2 (two) times daily.   11/06/2016 at Unknown time   Allergies: is allergic to penicillins. OBHx:  OB History  Gravida Para Term Preterm AB Living  5 2 1 1 2 2   SAB TAB Ectopic Multiple Live Births  2       2    # Outcome Date GA Lbr Len/2nd Weight Sex Delivery Anes PTL Lv  5 Current           4 Preterm 03/08/12    F Vag-Spont   LIV  3 Term 01/10/11    M Vag-Spont   LIV  2 SAB 2010          1 SAB 2006             YQM:VHQIONGE/[id number]:Negative/unremarkable except as detailed in HPI. Soc Hx: Alcohol: none and Recreational drug use: none  Objective:   Vitals:   11/06/16 1811 11/06/16 1826  BP: 132/73 131/77  Pulse: 90 83  Resp:    Temp:     General: Well nourished, well developed female in no acute distress.  Skin: Warm and dry.  Cardiovascular:Regular rate and rhythm. Respiratory: Clear to auscultation bilateral. Normal respiratory effort  Abdomen: tenderness in areas of left and right groin, gravid and uterus NT Neuro/Psych: Normal mood and affect.   Pelvic exam: is not limited by body habitus EGBUS: within normal limits Vagina: within normal limits and with normal mucosa blood in the vault Cervix: CLOSED and THICK and HIGH Uterus: No contractions observed for 30 minutes.  EFM:FHR: 140 bpm, variability: moderate,  accelerations:  Present,  decelerations:  Absent Toco: None  Assessment & Plan:   25 y.o. [id number] @ 3866w2d, Admitted on 11/06/2016:Headache. Round Ligament Pain. No s/sx PTL or Preeclampsia Tylenol, rest, monitoring

## 2016-11-10 LAB — OB RESULTS CONSOLE GBS: GBS: NEGATIVE

## 2016-11-12 LAB — OB RESULTS CONSOLE GC/CHLAMYDIA
Chlamydia: NEGATIVE
Gonorrhea: NEGATIVE

## 2016-11-20 IMAGING — US US OB COMP LESS 14 WK
1 series · 14 of 28 positions shown · non-contrast
Comparison: None.

CLINICAL DATA: Lower abdominal cramping for 1 week.

EXAM:
OBSTETRIC <14 WK US AND TRANSVAGINAL OB US
TECHNIQUE: Both transabdominal and transvaginal ultrasound examinations were
performed for complete evaluation of the gestation as well as the
maternal uterus, adnexal regions, and pelvic cul-de-sac.
Transvaginal technique was performed to assess early pregnancy.

[Series 1: us ob comp less 14 wk · 0.23mm/px · 14 of 78 slices shown]
[im 3/78]
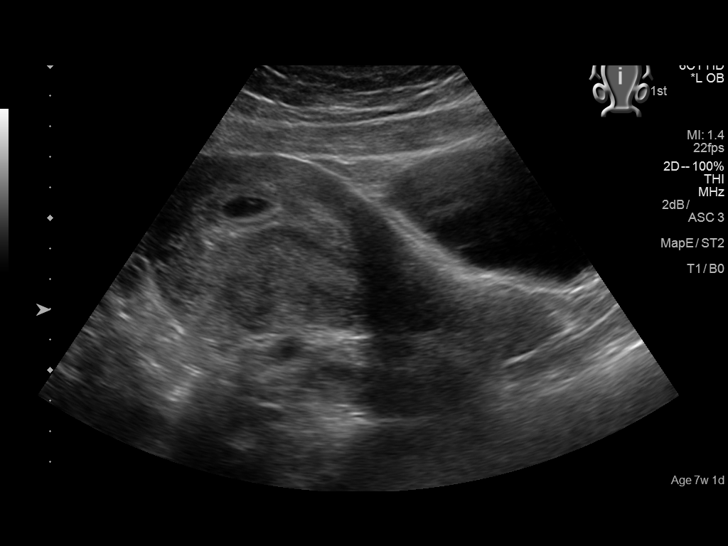
[im 9/78]
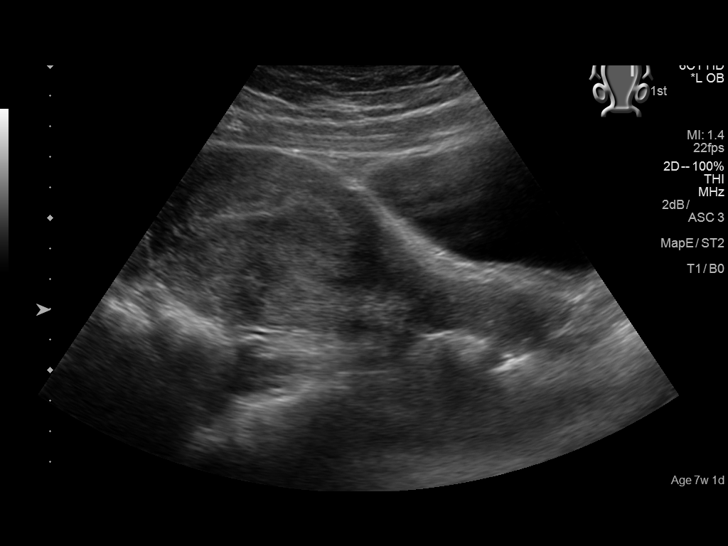
[im 15/78]
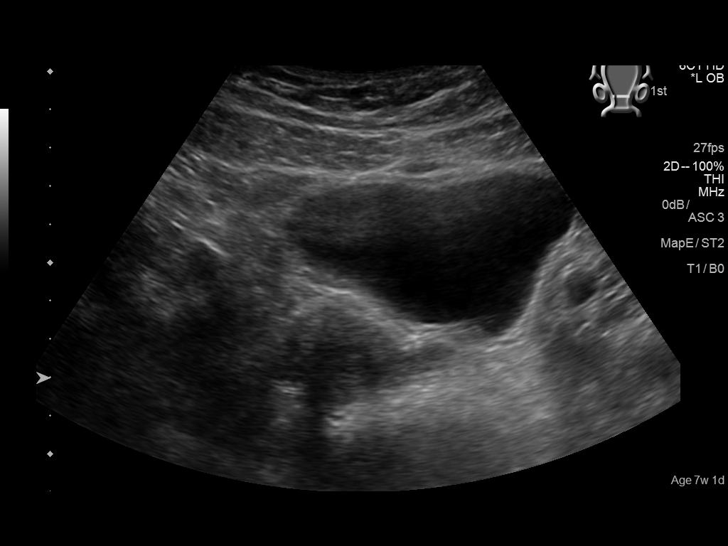
[im 20/78]
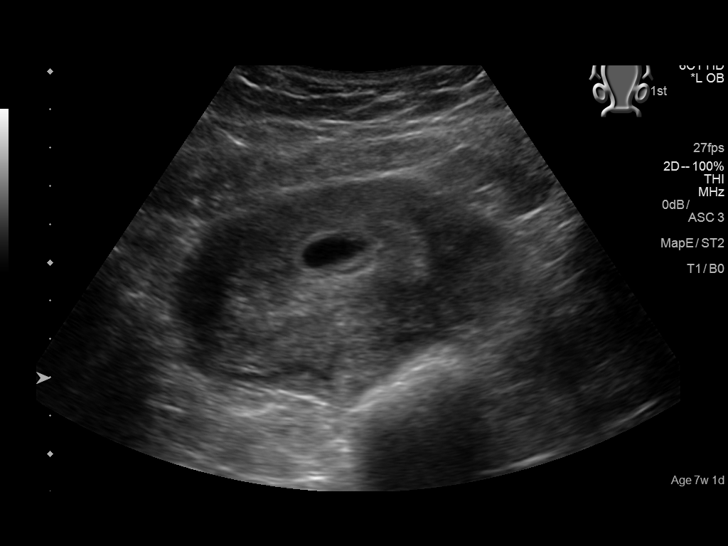
[im 26/78]
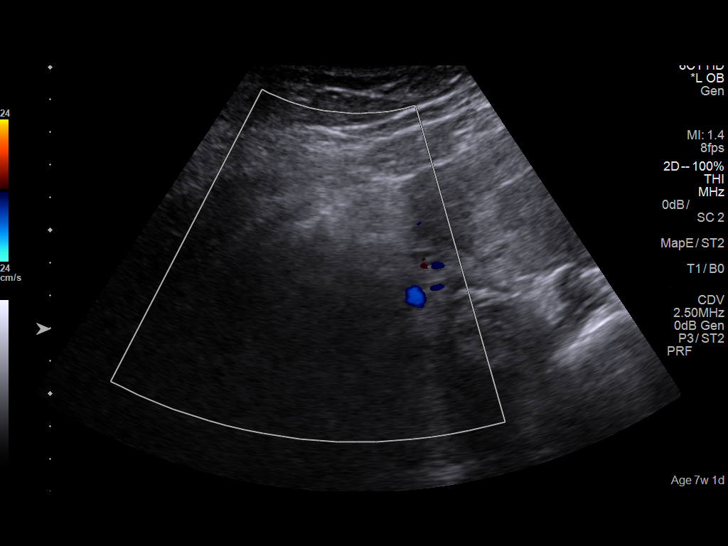
[im 32/78]
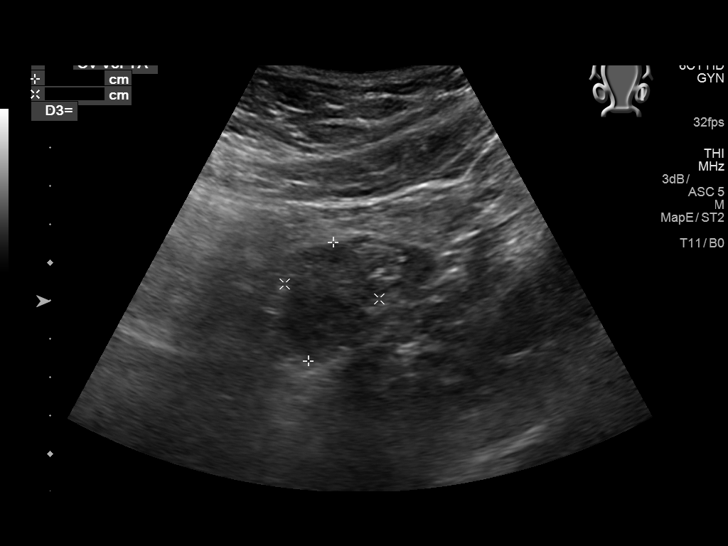
[im 38/78]
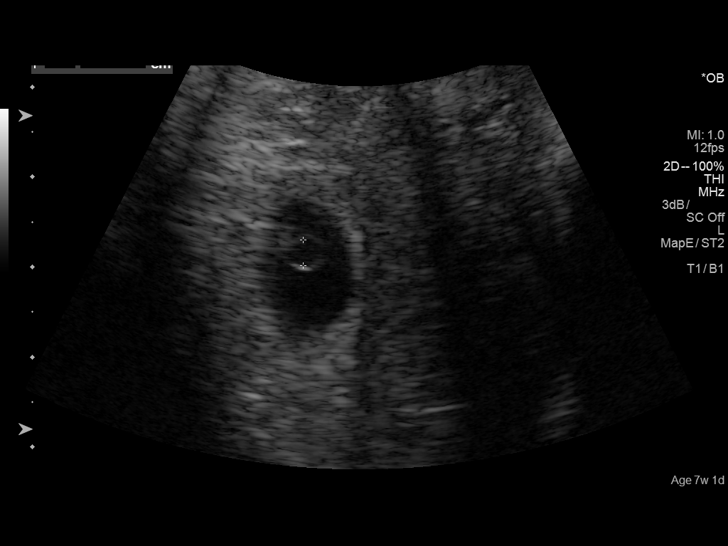
[im 43/78]
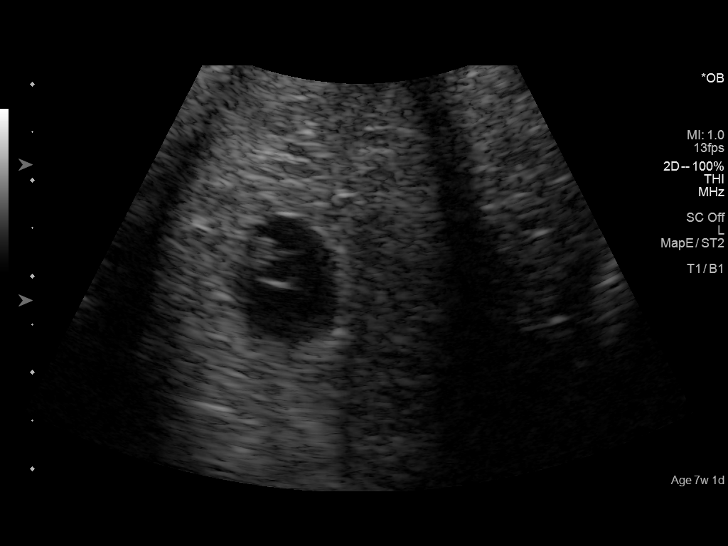
[im 49/78]
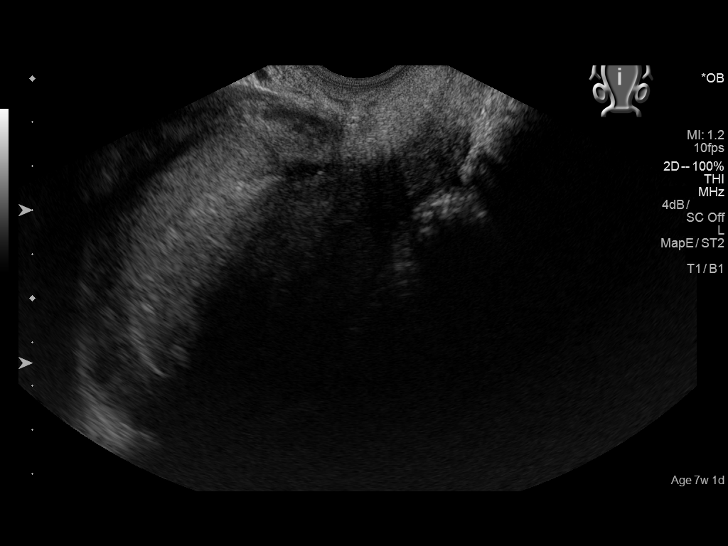
[im 55/78]
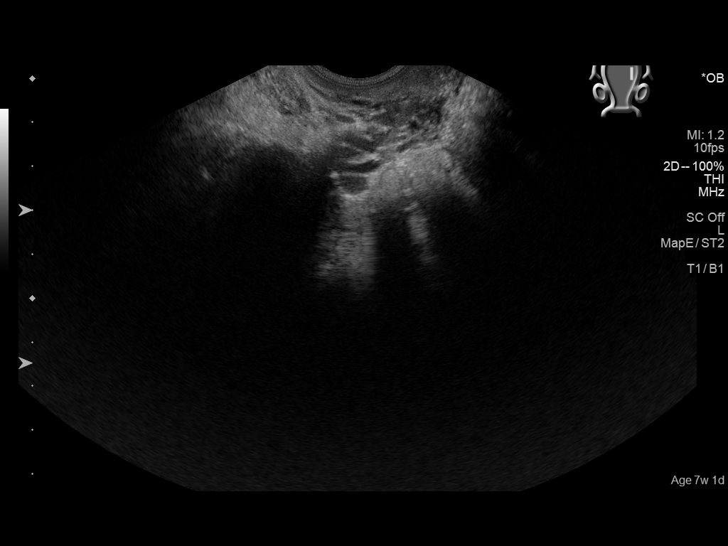
[im 60/78]
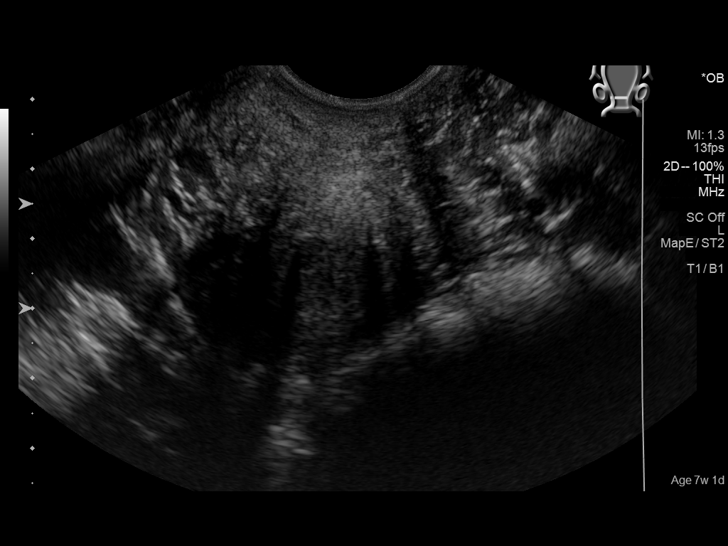
[im 66/78]
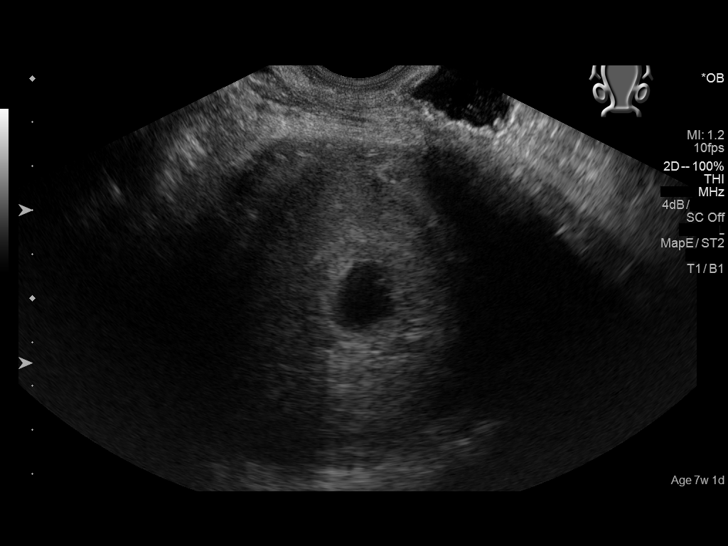
[im 72/78]
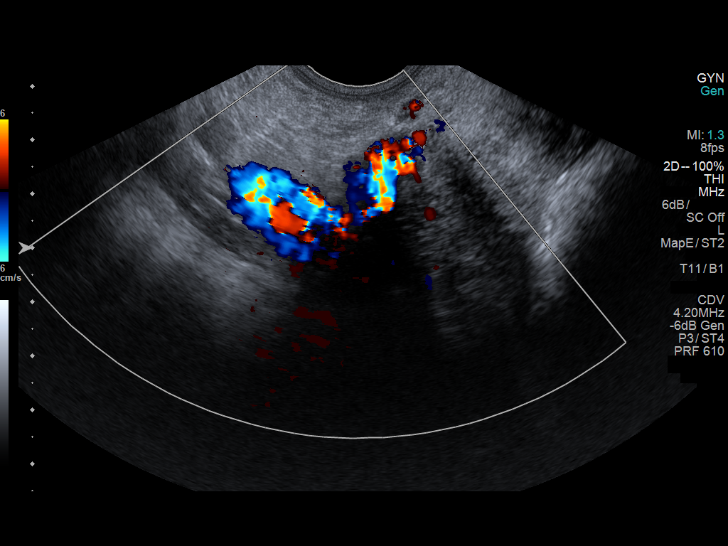
[im 78/78]
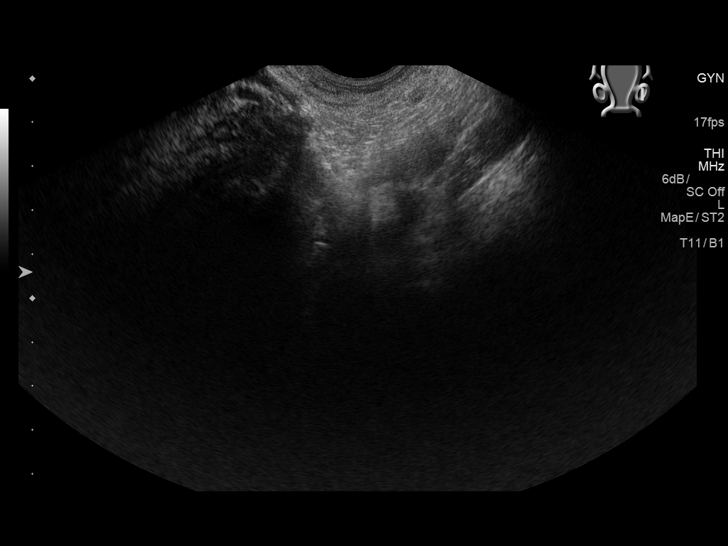

[14 of 28 positions shown; findings below may reference images not displayed]

FINDINGS: Intrauterine gestational sac: Visualized, normal shape

Yolk sac:  Visualized

Embryo:  Visualized

Cardiac Activity: Visualized

Heart Rate: 125  bpm

MSD:   mm    w     d

CRL:  3.5  mm   6 w   0 d                  US EDC: 12/09/2016

Subchorionic hemorrhage:  None visualized.

Maternal uterus/adnexae: No adnexal masses or free fluid.
IMPRESSION: Single viable intrauterine pregnancy with fetal heart rate 125 beats
per minute. An estimated gestational age 6 weeks 0 days.

## 2016-11-24 ENCOUNTER — Encounter: Payer: Self-pay | Admitting: *Deleted

## 2016-11-24 ENCOUNTER — Inpatient Hospital Stay
Admission: EM | Admit: 2016-11-24 | Discharge: 2016-11-27 | DRG: 774 | Disposition: A | Discharge status: Home / Self Care | Payer: Medicaid Other | Attending: Advanced Practice Midwife | Admitting: Advanced Practice Midwife

## 2016-11-24 DIAGNOSIS — Z6841 Body Mass Index (BMI) 40.0 and over, adult: Secondary | ICD-10-CM | POA: Diagnosis not present

## 2016-11-24 DIAGNOSIS — D649 Anemia, unspecified: Secondary | ICD-10-CM | POA: Diagnosis present

## 2016-11-24 DIAGNOSIS — O9842 Viral hepatitis complicating childbirth: Secondary | ICD-10-CM | POA: Diagnosis present

## 2016-11-24 DIAGNOSIS — O99213 Obesity complicating pregnancy, third trimester: Secondary | ICD-10-CM

## 2016-11-24 DIAGNOSIS — Z3A37 37 weeks gestation of pregnancy: Secondary | ICD-10-CM

## 2016-11-24 DIAGNOSIS — O9902 Anemia complicating childbirth: Secondary | ICD-10-CM | POA: Diagnosis present

## 2016-11-24 DIAGNOSIS — O134 Gestational [pregnancy-induced] hypertension without significant proteinuria, complicating childbirth: Secondary | ICD-10-CM | POA: Diagnosis present

## 2016-11-24 DIAGNOSIS — R51 Headache: Secondary | ICD-10-CM

## 2016-11-24 DIAGNOSIS — B181 Chronic viral hepatitis B without delta-agent: Secondary | ICD-10-CM | POA: Diagnosis present

## 2016-11-24 DIAGNOSIS — O99344 Other mental disorders complicating childbirth: Secondary | ICD-10-CM | POA: Diagnosis present

## 2016-11-24 DIAGNOSIS — F418 Other specified anxiety disorders: Secondary | ICD-10-CM | POA: Diagnosis present

## 2016-11-24 DIAGNOSIS — O9962 Diseases of the digestive system complicating childbirth: Secondary | ICD-10-CM | POA: Diagnosis present

## 2016-11-24 DIAGNOSIS — O99214 Obesity complicating childbirth: Secondary | ICD-10-CM | POA: Diagnosis present

## 2016-11-24 DIAGNOSIS — K219 Gastro-esophageal reflux disease without esophagitis: Secondary | ICD-10-CM | POA: Diagnosis present

## 2016-11-24 DIAGNOSIS — R519 Headache, unspecified: Secondary | ICD-10-CM | POA: Diagnosis present

## 2016-11-24 DIAGNOSIS — Z369 Encounter for antenatal screening, unspecified: Secondary | ICD-10-CM

## 2016-11-24 DIAGNOSIS — O26893 Other specified pregnancy related conditions, third trimester: Secondary | ICD-10-CM | POA: Diagnosis present

## 2016-11-24 HISTORY — DX: Depression, unspecified: F32.A

## 2016-11-24 HISTORY — DX: Anxiety disorder, unspecified: F41.9

## 2016-11-24 HISTORY — DX: Bipolar disorder, unspecified: F31.9

## 2016-11-24 HISTORY — DX: Major depressive disorder, single episode, unspecified: F32.9

## 2016-11-24 HISTORY — DX: Headache, unspecified: R51.9

## 2016-11-24 HISTORY — DX: Anemia, unspecified: D64.9

## 2016-11-24 HISTORY — DX: Headache: R51

## 2016-11-24 HISTORY — DX: Gastro-esophageal reflux disease without esophagitis: K21.9

## 2016-11-24 LAB — CBC
HCT: 31.5 % — ABNORMAL LOW (ref 35.0–47.0)
Hemoglobin: 10.5 g/dL — ABNORMAL LOW (ref 12.0–16.0)
MCH: 30 pg (ref 26.0–34.0)
MCHC: 33.4 g/dL (ref 32.0–36.0)
MCV: 89.9 fL (ref 80.0–100.0)
Platelets: 210 10*3/uL (ref 150–440)
RBC: 3.5 MIL/uL — ABNORMAL LOW (ref 3.80–5.20)
RDW: 13.9 % (ref 11.5–14.5)
WBC: 6 10*3/uL (ref 3.6–11.0)

## 2016-11-24 LAB — PROTEIN / CREATININE RATIO, URINE
Creatinine, Urine: 91 mg/dL
Protein Creatinine Ratio: 0.13 mg/mg{Cre} (ref 0.00–0.15)
Total Protein, Urine: 12 mg/dL

## 2016-11-24 LAB — COMPREHENSIVE METABOLIC PANEL
ALT: 23 U/L (ref 14–54)
AST: 21 U/L (ref 15–41)
Albumin: 2.8 g/dL — ABNORMAL LOW (ref 3.5–5.0)
Alkaline Phosphatase: 116 U/L (ref 38–126)
Anion gap: 4 — ABNORMAL LOW (ref 5–15)
BUN: 8 mg/dL (ref 6–20)
CO2: 23 mmol/L (ref 22–32)
Calcium: 8.4 mg/dL — ABNORMAL LOW (ref 8.9–10.3)
Chloride: 108 mmol/L (ref 101–111)
Creatinine, Ser: 0.53 mg/dL (ref 0.44–1.00)
GFR calc Af Amer: >60 mL/min (ref >60)
GFR calc non Af Amer: >60 mL/min (ref >60)
Glucose, Bld: 89 mg/dL (ref 65–99)
Potassium: 3.8 mmol/L (ref 3.5–5.1)
Sodium: 135 mmol/L (ref 135–145)
Total Bilirubin: 0.8 mg/dL (ref 0.3–1.2)
Total Protein: 6.5 g/dL (ref 6.5–8.1)

## 2016-11-24 LAB — TYPE AND SCREEN
ABO/RH(D): O POS
Antibody Screen: NEGATIVE

## 2016-11-24 LAB — URINE DRUG SCREEN, QUALITATIVE (ARMC ONLY)
Amphetamines, Ur Screen: NOT DETECTED
Barbiturates, Ur Screen: NOT DETECTED
Benzodiazepine, Ur Scrn: NOT DETECTED
Cannabinoid 50 Ng, Ur ~~LOC~~: NOT DETECTED
Cocaine Metabolite,Ur ~~LOC~~: NOT DETECTED
MDMA (Ecstasy)Ur Screen: NOT DETECTED
Methadone Scn, Ur: NOT DETECTED
Opiate, Ur Screen: NOT DETECTED
Phencyclidine (PCP) Ur S: NOT DETECTED
Tricyclic, Ur Screen: NOT DETECTED

## 2016-11-24 MED ORDER — LIDOCAINE HCL (PF) 1 % IJ SOLN: 30.0000 mL | INTRAMUSCULAR | Status: DC | PRN

## 2016-11-24 MED ORDER — OXYTOCIN 10 UNIT/ML IJ SOLN: 10.0000 [IU] | Freq: Once | INTRAMUSCULAR | Status: DC

## 2016-11-24 MED ORDER — ONDANSETRON HCL 4 MG/2ML IJ SOLN: 4.0000 mg | Freq: Four times a day (QID) | INTRAMUSCULAR | Status: DC | PRN

## 2016-11-24 MED ORDER — OXYTOCIN BOLUS FROM INFUSION
500.0000 mL | Freq: Once | INTRAVENOUS | Status: AC
  Administered 2016-11-25: 500 mL via INTRAVENOUS

## 2016-11-24 MED ORDER — BUTALBITAL-APAP-CAFFEINE 50-325-40 MG PO TABS
ORAL_TABLET | ORAL | Status: AC
  Administered 2016-11-24: 2 via ORAL
  Filled 2016-11-24: qty 2

## 2016-11-24 MED ORDER — LACTATED RINGERS IV SOLN
INTRAVENOUS | Status: DC
  Administered 2016-11-24 – 2016-11-25 (×3): via INTRAVENOUS

## 2016-11-24 MED ORDER — DINOPROSTONE 10 MG VA INST
10.0000 mg | VAGINAL_INSERT | Freq: Once | VAGINAL | Status: AC
  Administered 2016-11-24: 10 mg via VAGINAL
  Filled 2016-11-24: qty 1

## 2016-11-24 MED ORDER — LACTATED RINGERS IV SOLN
500.0000 mL | INTRAVENOUS | Status: DC | PRN
  Administered 2016-11-25: 1000 mL via INTRAVENOUS

## 2016-11-24 MED ORDER — BUTALBITAL-APAP-CAFFEINE 50-325-40 MG PO TABS
2.0000 | ORAL_TABLET | Freq: Once | ORAL | Status: AC
  Administered 2016-11-24: 2 via ORAL

## 2016-11-24 MED ORDER — OXYTOCIN 40 UNITS IN LACTATED RINGERS INFUSION - SIMPLE MED
2.5000 [IU]/h | INTRAVENOUS | Status: DC
  Filled 2016-11-24: qty 1000

## 2016-11-24 MED ORDER — SOD CITRATE-CITRIC ACID 500-334 MG/5ML PO SOLN: 30.0000 mL | ORAL | Status: DC | PRN

## 2016-11-24 MED ORDER — TERBUTALINE SULFATE 1 MG/ML IJ SOLN: 0.2500 mg | Freq: Once | INTRAMUSCULAR | Status: DC | PRN

## 2016-11-24 NOTE — OB Triage Note (Signed)
HA all week, frontal. No relief with rest or Tylenol. Reports some dizziness. Denies other visual disturbances. Denies epigastric pain. C/o mid/lower back pain. Pain is sharp, constant, sometimes wrapping around both sides of abdomen. Some relief when sitting. Elaina HoopsElks, Yvett Rossel S

## 2016-11-24 NOTE — H&P (Signed)
OB History & Physical   History of Present Illness:  Chief Complaint: back pain and headache  HPI:  Regina Fowlernastasia J Dunn is a 25 y.o. [id number] female at 459w6d dated by early ultrasound.   Her pregnancy has been complicated by: 1) PICA (ice) 2) headaches 3) anemia in pregnancy 4) Morbid obesity, bmi 45 5) Proteinuria in pregnancy 6) Trichomonas - treated with negative test of cure 7) Hyperemesis gravidarum 8) Chlamydia - treated with negative test of cure 9) Mixed anxiety and depression disorder 10) Bipolar 1 disorder 11) chronic hepatitis B  She denies contractions.   She denies leakage of fluid.   She denies vaginal bleeding.   She reports fetal movement.   She has had a headache all week and has tried and failed Tylenol.  She has tried and has had mild improvement with fioricet here in triage.  She has had a headache during most of her pregnancy.  She denies visual changes and RUQ pain.  She has had some low back pain all week long and this has gotten worse today.   Maternal Medical History:   Past Medical History:  Diagnosis Date  . Anemia   . Anxiety   . Bipolar 1 disorder (HCC)   . Depression   . GERD (gastroesophageal reflux disease)   . Headache   . Hepatitis B affecting pregnancy 2007   No current treatment required, titers show up negative    Past Surgical History:  Procedure Laterality Date  . NO PAST SURGERIES      Allergies  Allergen Reactions  . Penicillins Swelling    Has patient had a PCN reaction causing immediate rash, facial/tongue/throat swelling, SOB or lightheadedness with hypotension: No Has patient had a PCN reaction causing severe rash involving mucus membranes or skin necrosis: No Has patient had a PCN reaction that required hospitalization No Has patient had a PCN reaction occurring within the last 10 years: No If all of the above answers are "NO", then may proceed with Cephalosporin use.    Prior to Admission medications   Medication Sig  Start Date End Date Taking? Authorizing Provider  Prenatal Vit-Fe Fumarate-FA (PRENATAL MULTIVITAMIN) TABS tablet Take 1 tablet by mouth daily at 12 noon.   Yes Historical Provider, MD  ranitidine (ZANTAC) 150 MG tablet Take 150 mg by mouth 2 (two) times daily.   Yes Historical Provider, MD    OB History  Gravida Para Term Preterm AB Living  5 2 2  0 2 2  SAB TAB Ectopic Multiple Live Births  2       2    # Outcome Date GA Lbr Len/2nd Weight Sex Delivery Anes PTL Lv  5 Current           4 Term 03/08/12    F Vag-Spont   LIV  3 Term 01/10/11    M Vag-Spont   LIV  2 SAB 2010          1 SAB 2006              Prenatal care site: [facility]  Social History: She  reports that she has quit smoking. She has never used smokeless tobacco. She reports that she does not drink alcohol or use drugs.  Family History: Reviewed and none pertinent to this hospitalization   Review of Systems: Negative x 10 systems reviewed except as noted in the HPI.    Physical Exam:  Vital Signs: BP (!) 143/86   Pulse 84   Temp 98.2  F (36.8 C) (Oral)   Resp 18   Ht 5\' 6"  (1.676 m)   Wt 286 lb (129.7 kg)   LMP 02/25/2016   BMI 46.16 kg/m  General: no acute distress.  HEENT: normocephalic, atraumatic Heart: regular rate & rhythm.  No murmurs/rubs/gallops Lungs: clear to auscultation bilaterally Abdomen: soft, gravid, non-tender;  EFW: 8 pounds Pelvic: pending Extremities: non-tender, symmetric, no edema bilaterally.  DTRs: 2+  Neurologic: Alert & oriented x 3.    Pertinent Results:  Prenatal Labs: Blood type/Rh O positive  Antibody screen negative  Rubella Immune  Varicella Immune    RPR NR  HIV negative  GC negative  Chlamydia Positive, treated with negative TOC  Genetic screening Negative 2nd trimester screen/AFP  1 hour GTT 90 (early), 86 (28 wk)  3 hour GTT n/a  GBS negative on 11/10/16   Baseline FHR: 135 beats/min   Variability: moderate   Accelerations: present   Decelerations:  absent Contractions: absent  Overall assessment: category 1  Lab Results  Component Value Date   WBC 6.0 11/24/2016   HGB 10.5 (L) 11/24/2016   HCT 31.5 (L) 11/24/2016   PLT 210 11/24/2016   CREATININE 0.53 11/24/2016   PROTCRRATIO 0.13 11/24/2016    Assessment:  Regina Chandler is a 25 y.o. [id number] female at 713w6d with gestational hypertension. This is a bit difficult given her current headache and her history of refractory headaches this pregnancy. Her preeclampsia labs are normal. Will treat as gestational hypertension. Will continue to monitor her symptoms and give her magnesium sulfate therapy, if needed..   Plan:  1. Admit to Labor & Delivery  2. CBC, T&S, Clrs, IVF 3. GBS negative.   4. Fetwal well-being: reassuring 5. Gonorrhea/chlamydia testing on admission.   Conard NovakJackson, Ulyssa Walthour D, MD 11/24/2016 7:20 PM

## 2016-11-25 ENCOUNTER — Inpatient Hospital Stay: Payer: Medicaid Other | Admitting: Certified Registered Nurse Anesthetist

## 2016-11-25 ENCOUNTER — Encounter: Payer: Self-pay | Admitting: Advanced Practice Midwife

## 2016-11-25 LAB — CBC
HCT: 34.4 % — ABNORMAL LOW (ref 35.0–47.0)
Hemoglobin: 11.8 g/dL — ABNORMAL LOW (ref 12.0–16.0)
MCH: 30.3 pg (ref 26.0–34.0)
MCHC: 34.3 g/dL (ref 32.0–36.0)
MCV: 88.3 fL (ref 80.0–100.0)
Platelets: 212 10*3/uL (ref 150–440)
RBC: 3.89 MIL/uL (ref 3.80–5.20)
RDW: 14.2 % (ref 11.5–14.5)
WBC: 6.9 10*3/uL (ref 3.6–11.0)

## 2016-11-25 LAB — CHLAMYDIA/NGC RT PCR (ARMC ONLY)
Chlamydia Tr: NOT DETECTED
N gonorrhoeae: NOT DETECTED

## 2016-11-25 MED ORDER — OXYCODONE-ACETAMINOPHEN 5-325 MG PO TABS: 1.0000 | ORAL_TABLET | ORAL | Status: DC | PRN

## 2016-11-25 MED ORDER — BUTORPHANOL TARTRATE 1 MG/ML IJ SOLN: 2.0000 mg | Freq: Once | INTRAMUSCULAR | Status: DC

## 2016-11-25 MED ORDER — OXYTOCIN 40 UNITS IN LACTATED RINGERS INFUSION - SIMPLE MED
1.0000 m[IU]/min | INTRAVENOUS | Status: DC
  Administered 2016-11-25: 1 m[IU]/min via INTRAVENOUS

## 2016-11-25 MED ORDER — BUPIVACAINE HCL (PF) 0.25 % IJ SOLN
INTRAMUSCULAR | Status: DC | PRN
  Administered 2016-11-25: 5 mL via EPIDURAL
  Administered 2016-11-25: 4 mL via EPIDURAL

## 2016-11-25 MED ORDER — PRENATAL MULTIVITAMIN CH
1.0000 | ORAL_TABLET | Freq: Every day | ORAL | Status: DC
  Administered 2016-11-26 – 2016-11-27 (×2): 1 via ORAL
  Filled 2016-11-25 (×2): qty 1

## 2016-11-25 MED ORDER — SIMETHICONE 80 MG PO CHEW: 80.0000 mg | CHEWABLE_TABLET | ORAL | Status: DC | PRN

## 2016-11-25 MED ORDER — ACETAMINOPHEN 325 MG PO TABS
650.0000 mg | ORAL_TABLET | ORAL | Status: DC | PRN
Start: 2016-11-25 — End: 2016-11-27

## 2016-11-25 MED ORDER — FERROUS SULFATE 325 (65 FE) MG PO TABS
325.0000 mg | ORAL_TABLET | Freq: Every day | ORAL | Status: DC
  Administered 2016-11-26 – 2016-11-27 (×2): 325 mg via ORAL
  Filled 2016-11-25 (×2): qty 1

## 2016-11-25 MED ORDER — FENTANYL 2.5 MCG/ML W/ROPIVACAINE 0.2% IN NS 100 ML EPIDURAL INFUSION (ARMC-ANES)
EPIDURAL | Status: DC | PRN
  Administered 2016-11-25: 10 mL/h via EPIDURAL

## 2016-11-25 MED ORDER — IBUPROFEN 600 MG PO TABS
600.0000 mg | ORAL_TABLET | Freq: Four times a day (QID) | ORAL | Status: DC
  Administered 2016-11-26 – 2016-11-27 (×5): 600 mg via ORAL
  Filled 2016-11-25 (×5): qty 1

## 2016-11-25 MED ORDER — AMMONIA AROMATIC IN INHA
RESPIRATORY_TRACT | Status: AC
  Filled 2016-11-25: qty 10

## 2016-11-25 MED ORDER — LIDOCAINE HCL (PF) 1 % IJ SOLN
INTRAMUSCULAR | Status: DC | PRN
  Administered 2016-11-25: 3 mL

## 2016-11-25 MED ORDER — HYDROMORPHONE HCL 1 MG/ML IJ SOLN: 1.0000 mg | INTRAMUSCULAR | Status: DC | PRN

## 2016-11-25 MED ORDER — ONDANSETRON HCL 4 MG/2ML IJ SOLN: 4.0000 mg | INTRAMUSCULAR | Status: DC | PRN

## 2016-11-25 MED ORDER — OXYTOCIN 10 UNIT/ML IJ SOLN
INTRAMUSCULAR | Status: AC
  Filled 2016-11-25: qty 2

## 2016-11-25 MED ORDER — LIDOCAINE-EPINEPHRINE (PF) 1.5 %-1:200000 IJ SOLN
INTRAMUSCULAR | Status: DC | PRN
  Administered 2016-11-25: 3 mL via PERINEURAL

## 2016-11-25 MED ORDER — BENZOCAINE-MENTHOL 20-0.5 % EX AERO: 1.0000 "application " | INHALATION_SPRAY | CUTANEOUS | Status: DC | PRN

## 2016-11-25 MED ORDER — DIPHENHYDRAMINE HCL 25 MG PO CAPS: 25.0000 mg | ORAL_CAPSULE | Freq: Four times a day (QID) | ORAL | Status: DC | PRN

## 2016-11-25 MED ORDER — OXYCODONE-ACETAMINOPHEN 5-325 MG PO TABS: 2.0000 | ORAL_TABLET | ORAL | Status: DC | PRN

## 2016-11-25 MED ORDER — WITCH HAZEL-GLYCERIN EX PADS: 1.0000 "application " | MEDICATED_PAD | CUTANEOUS | Status: DC | PRN

## 2016-11-25 MED ORDER — COCONUT OIL OIL: 1.0000 "application " | TOPICAL_OIL | Status: DC | PRN

## 2016-11-25 MED ORDER — TERBUTALINE SULFATE 1 MG/ML IJ SOLN: 0.2500 mg | Freq: Once | INTRAMUSCULAR | Status: DC | PRN

## 2016-11-25 MED ORDER — LIDOCAINE HCL (PF) 1 % IJ SOLN
INTRAMUSCULAR | Status: AC
  Filled 2016-11-25: qty 30

## 2016-11-25 MED ORDER — DIBUCAINE 1 % RE OINT: 1.0000 "application " | TOPICAL_OINTMENT | RECTAL | Status: DC | PRN

## 2016-11-25 MED ORDER — BUTORPHANOL TARTRATE 1 MG/ML IJ SOLN
2.0000 mg | INTRAMUSCULAR | Status: DC | PRN
  Administered 2016-11-25: 2 mg via INTRAVENOUS
  Filled 2016-11-25: qty 2

## 2016-11-25 MED ORDER — SODIUM CHLORIDE FLUSH 0.9 % IV SOLN
INTRAVENOUS | Status: AC
  Administered 2016-11-25: 10 mL
  Filled 2016-11-25: qty 20

## 2016-11-25 MED ORDER — DOCUSATE SODIUM 100 MG PO CAPS
100.0000 mg | ORAL_CAPSULE | Freq: Two times a day (BID) | ORAL | Status: DC | PRN
  Administered 2016-11-26: 100 mg via ORAL
  Filled 2016-11-25: qty 1

## 2016-11-25 MED ORDER — ZOLPIDEM TARTRATE 5 MG PO TABS: 5.0000 mg | ORAL_TABLET | Freq: Every evening | ORAL | Status: DC | PRN

## 2016-11-25 MED ORDER — MISOPROSTOL 200 MCG PO TABS
ORAL_TABLET | ORAL | Status: AC
  Filled 2016-11-25: qty 4

## 2016-11-25 MED ORDER — LACTATED RINGERS IV SOLN
INTRAVENOUS | Status: DC
  Administered 2016-11-26: 02:00:00 via INTRAVENOUS

## 2016-11-25 MED ORDER — FENTANYL 2.5 MCG/ML W/ROPIVACAINE 0.2% IN NS 100 ML EPIDURAL INFUSION (ARMC-ANES)
EPIDURAL | Status: AC
  Filled 2016-11-25: qty 100

## 2016-11-25 MED ORDER — ONDANSETRON HCL 4 MG PO TABS: 4.0000 mg | ORAL_TABLET | ORAL | Status: DC | PRN

## 2016-11-25 NOTE — Anesthesia Preprocedure Evaluation (Signed)
Anesthesia Evaluation  Patient identified by MRN, date of birth, ID band Patient awake    Reviewed: Allergy & Precautions, H&P , NPO status   Airway Mallampati: II     Mouth opening: Limited Mouth Opening  Dental  (+) Teeth Intact   Pulmonary neg pulmonary ROS, former smoker,    Pulmonary exam normal        Cardiovascular Exercise Tolerance: Good (-) hypertensionNormal cardiovascular exam     Neuro/Psych  Headaches, PSYCHIATRIC DISORDERS    GI/Hepatic GERD  Medicated,(+) B  Endo/Other  negative endocrine ROS  Renal/GU negative Renal ROS  negative genitourinary   Musculoskeletal   Abdominal   Peds  Hematology  (+) anemia ,   Anesthesia Other Findings   Reproductive/Obstetrics (+) Pregnancy                             Anesthesia Physical Anesthesia Plan  ASA: II  Anesthesia Plan: Epidural   Post-op Pain Management:    Induction:   Airway Management Planned:   Additional Equipment:   Intra-op Plan:   Post-operative Plan:   Informed Consent:   Dental Advisory Given  Plan Discussed with:   Anesthesia Plan Comments:         Anesthesia Quick Evaluation

## 2016-11-25 NOTE — Anesthesia Procedure Notes (Signed)
Epidural Patient location during procedure: OB Start time: 11/25/2016 1:50 PM End time: 11/25/2016 2:02 PM  Staffing Anesthesiologist: Naomie DeanKEPHART, WILLIAM K Resident/CRNA: Ginger CarneMICHELET, Ramesh Moan Performed: resident/CRNA   Preanesthetic Checklist Completed: patient identified, site marked, surgical consent, pre-op evaluation, timeout performed, IV checked, risks and benefits discussed and monitors and equipment checked  Epidural Patient position: sitting Prep: Betadine Patient monitoring: heart rate, continuous pulse ox and blood pressure Approach: midline Location: L4-L5 Injection technique: LOR air  Needle:  Needle type: Tuohy  Needle gauge: 17 G Needle length: 9 cm and 9 Needle insertion depth: 8 cm Catheter type: closed end flexible Catheter size: 19 Gauge Catheter at skin depth: 14 cm Test dose: negative and 1.5% lidocaine with Epi 1:200 K  Assessment Sensory level: T10 Events: blood not aspirated, injection not painful, no injection resistance, negative IV test and no paresthesia  Additional Notes Pt. Evaluated and documentation done after procedure finished. Patient identified. Risks/Benefits/Options discussed with patient including but not limited to bleeding, infection, nerve damage, paralysis, failed block, incomplete pain control, headache, blood pressure changes, nausea, vomiting, reactions to medication both or allergic, itching and postpartum back pain. Confirmed with bedside nurse the patient's most recent platelet count. Confirmed with patient that they are not currently taking any anticoagulation, have any bleeding history or any family history of bleeding disorders. Patient expressed understanding and wished to proceed. All questions were answered. Sterile technique was used throughout the entire procedure. Please see nursing notes for vital signs. Test dose was given through epidural catheter and negative prior to continuing to dose epidural or start infusion. Warning  signs of high block given to the patient including shortness of breath, tingling/numbness in hands, complete motor block, or any concerning symptoms with instructions to call for help. Patient was given instructions on fall risk and not to get out of bed. All questions and concerns addressed with instructions to call with any issues or inadequate analgesia.   Patient tolerated the insertion well without immediate complications.Reason for block:procedure for pain

## 2016-11-25 NOTE — Discharge Summary (Signed)
Obstetric Discharge Summary Reason for Admission: IOL for GHTN Delivery Type: spontaneous vaginal delivery Postpartum Procedures: none Complications-Intrapartum or Postpartum: none    Recent Labs  11/25/16 1318 11/26/16 0400  HGB 11.8* 11.0*  HCT 34.4* 32.6*    Gestational Age at Delivery: 765w0d  Antepartum complications:  1) PICA 2) headaches 3) anemia in pregnancy 4) Morbid obesity, bmi 45 5) Proteinuria in pregnancy 6) Trichomonas - treated with negative test of cure 7) Hyperemesis gravidarum 8) Chlamydia - treated with negative test of cure 9) Mixed anxiety and depression disorder 10) Bipolar 1 disorder 11) chronic hepatitis B 12) GHTN  Date of Delivery: 11/25/16  Delivered By: T. Brothers, CNM   Physical Exam:  General: alert and cooperative Lochia: appropriate Uterine Fundus: firm Incision: N/A DVT Evaluation: No evidence of DVT seen on physical exam. Abdomen: abdomen is soft without significant tenderness, masses, organomegaly or guarding  Prenatal Labs Blood Type: O+ Rubella: Immune Varicella: Immune TDAP: Up to date and Given during pregnancy Flu: Given during pregnancy Feeding: Breast Contraception: interval BTL  Discharge Diagnoses: Term Pregnancy-delivered and GHTN  Discharge Information: Date: 11/27/2016 Activity: pelvic rest Diet: routine Medications: Ibuprofen and Colace Condition: stable Instructions:  Discharge instructions:   Call office if you have any of the following: headache, visual changes, fever >100 F, chills, breast concerns, excessive vaginal bleeding, incision drainage or problems, leg pain or redness, depression or any other concerns.   Activity: Do not lift > 10 lbs for 6 weeks.  No intercourse or tampons for 6 weeks.  No driving for 1-2 weeks.   Discharge to: home Follow-up Information    [facility] week([facility]).   Why:  BTL consult Contact information: [address] [place]  [place] [id number] [phone]        [facility] Follow [facility] in 6 week([facility]).   Contact information: [address] [place] [phone]           Newborn Data: Live born female  APGAR: 239, 689  Home with mother.  Letitia Libraobert Paul Harris, MD 11/27/2016, 10:14 AM

## 2016-11-25 NOTE — Progress Notes (Signed)
L&D Note  11/25/2016 - 3:20 PM  25 y.o. [id number] 2367w0d   [name] is admitted for IOL for GHTN    Subjective:  Getting comfortable from epidural Objective:   Vitals:   11/25/16 1440 11/25/16 1443 11/25/16 1445 11/25/16 1450  BP:  (!) 96/49    Pulse:  62    Resp:    16  Temp:      TempSrc:      SpO2: 98%  100% 99%  Weight:      Height:        Current Vital Signs 24h Vital Sign Ranges  T 97.9 F (36.6 C) Temp  Avg: 98 F (36.7 C)  Min: 97.9 F (36.6 C)  Max: 98.1 F (36.7 C)  BP (!) 96/49 BP  Min: 95/56  Max: 162/96  HR 62 Pulse  Avg: 78.9  Min: 62  Max: 136  RR 16 Resp  Avg: 16.5  Min: 16  Max: 18  SaO2 99 %   SpO2  Avg: 98.4 %  Min: 96 %  Max: 100 %       24 Hour I/O Current Shift I/O  Time Ins Outs No intake/output data recorded. No intake/output data recorded.    FHR: Cat 1 Toco: q 3-4 min SVE: 6-7/100/-2   Assessment :  IUP at 6167w0d, GHTN    Plan:  AROM with small amount of bloody fluid Anticipate vaginal delivery  Marta AntuBrothers, Sharlett Lienemann, [place]

## 2016-11-25 NOTE — Plan of Care (Signed)
Pt ready for transfer to 345 via wheelchair in stable condition. Pt voided QS. Pericare performed. Ellison Carwin Jaren Vanetten RNC

## 2016-11-25 NOTE — Progress Notes (Signed)
L&D Note  11/25/2016 - 9:25 AM  25 y.o. [id number] 5167w0d   [name] is admitted for IOL for GHTN   Subjective:  Feeling some contractions from cervidil Objective:   Vitals:   11/25/16 0620 11/25/16 0757 11/25/16 0800 11/25/16 0817  BP: 135/82 (!) 162/96 123/79 128/85  Pulse: 67 68 88 81  Resp:      Temp:      TempSrc:      Weight:      Height:        Current Vital Signs 24h Vital Sign Ranges  T 98 F (36.7 C) Temp  Avg: 98.1 F (36.7 C)  Min: 98 F (36.7 C)  Max: 98.2 F (36.8 C)  BP 128/85 BP  Min: 123/79  Max: 162/96  HR 81 Pulse  Avg: 78.8  Min: 67  Max: 88  RR 16 Resp  Avg: 17  Min: 16  Max: 18  SaO2     No Data Recorded       24 Hour I/O Current Shift I/O  Time Ins Outs No intake/output data recorded. No intake/output data recorded.    FHR: cat 1, baseline 130, mod var, + accels Toco: q 4-6 min SVE: 3/80/-3/posterior/soft - pt very intolerant of exam   Assessment :  IUP at 2167w0d,   GHTN  Plan:  Cervidil removed, will start Pitocin once pt has had a chance to shower and eat breakfast. Defer Cytotec d/t frequency of contractions Continue monitoring for sx of pre-e with severe features - 1 severe range BP was after cuff change Anticipate vaginal delivery  Marta AntuBrothers, [place], [place]

## 2016-11-26 LAB — CBC
HCT: 32.6 % — ABNORMAL LOW (ref 35.0–47.0)
Hemoglobin: 11 g/dL — ABNORMAL LOW (ref 12.0–16.0)
MCH: 30.4 pg (ref 26.0–34.0)
MCHC: 33.9 g/dL (ref 32.0–36.0)
MCV: 89.8 fL (ref 80.0–100.0)
Platelets: 182 10*3/uL (ref 150–440)
RBC: 3.63 MIL/uL — ABNORMAL LOW (ref 3.80–5.20)
RDW: 14 % (ref 11.5–14.5)
WBC: 8.2 10*3/uL (ref 3.6–11.0)

## 2016-11-26 NOTE — Progress Notes (Signed)
Post Partum Day1 Subjective: no complaints, up ad lib, voiding, tolerating PO and baby not latching on yet. Awaiting Social workerlacation consultant. Is breast and bottle feeding  Objective: Blood pressure 131/78, pulse 73, temperature 98.5 F (36.9 C), temperature source Oral, resp. rate 20, height 5\' 6"  (1.676 m), weight 286 lb (129.7 kg), last menstrual period 02/25/2016, SpO2 99 %, unknown if currently breastfeeding.  Physical Exam:  General: alert, cooperative and no distress Lochia: appropriate Uterine Fundus: firm at U-1/ ML/ NT DVT Evaluation: No evidence of DVT seen on physical exam. Small amt of LE edema. Mild varicose veins   Recent Labs  11/25/16 1318 11/26/16 0400  HGB 11.8* 11.0*  HCT 34.4* 32.6*  WBC 6.9 8.2  PLT 212 182    Assessment/Plan: Stable ppd #1/ gestational hypertension Normotensive postpartum Plan for discharge tomorrow  O POS/ RI. VI Interval tubal TDAP and flu UTD Breast and bottle  Leiann Sporer, CNM    LOS: 2 days   Jaquasia Doscher 11/26/2016, 11:13 AM

## 2016-11-26 NOTE — Anesthesia Postprocedure Evaluation (Signed)
Anesthesia Post Note  Patient: Regina Chandler  Procedure(s) Performed: * No procedures listed *  Patient location during evaluation: Nursing Unit Anesthesia Type: Epidural Level of consciousness: awake, awake and alert and oriented Pain management: pain level controlled Vital Signs Assessment: post-procedure vital signs reviewed and stable Respiratory status: spontaneous breathing, nonlabored ventilation and respiratory function stable Cardiovascular status: stable Postop Assessment: no headache, no backache, no signs of nausea or vomiting, patient able to bend at knees and adequate PO intake Anesthetic complications: no     Last Vitals:  Vitals:   11/25/16 2329 11/26/16 0319  BP: 108/64 (!) 124/58  Pulse: 77 81  Resp: 16 18  Temp: 37 C 36.9 C    Last Pain:  Vitals:   11/26/16 0347  TempSrc:   PainSc: 0-No pain                 Marlana SalvageSandra Lyann Hagstrom

## 2016-11-27 NOTE — Progress Notes (Signed)
Discharge instr reviewed verb u/o.

## 2016-11-27 NOTE — Progress Notes (Signed)
Discharged to home; to car via w/c with auxillary 

## 2016-11-27 NOTE — Discharge Instructions (Signed)
Vaginal Delivery, Care After °Refer to this sheet in the next few weeks. These instructions provide you with information about caring for yourself after vaginal delivery. Your health care provider may also give you more specific instructions. Your treatment has been planned according to current medical practices, but problems sometimes occur. Call your health care provider if you have any problems or questions. °What can I expect after the procedure? °After vaginal delivery, it is common to have: °· Some bleeding from your vagina. °· Soreness in your abdomen, your vagina, and the area of skin between your vaginal opening and your anus (perineum). °· Pelvic cramps. °· Fatigue. °Follow these instructions at home: °Medicines °· Take over-the-counter and prescription medicines only as told by your health care provider. °· If you were prescribed an antibiotic medicine, take it as told by your health care provider. Do not stop taking the antibiotic until it is finished. °Driving °· Do not drive or operate heavy machinery while taking prescription pain medicine. °· Do not drive for 24 hours if you received a sedative. °Lifestyle °· Do not drink alcohol. This is especially important if you are breastfeeding or taking medicine to relieve pain. °· Do not use tobacco products, including cigarettes, chewing tobacco, or e-cigarettes. If you need help quitting, ask your health care provider. °Eating and drinking °· Drink at least 8 eight-ounce glasses of water every day unless you are told not to by your health care provider. If you choose to breastfeed your baby, you may need to drink more water than this. °· Eat high-fiber foods every day. These foods may help prevent or relieve constipation. High-fiber foods include: °¨ Whole grain cereals and breads. °¨ Brown rice. °¨ Beans. °¨ Fresh fruits and vegetables. °Activity °· Return to your normal activities as told by your health care provider. Ask your health care provider what  activities are safe for you. °· Rest as much as possible. Try to rest or take a nap when your baby is sleeping. °· Do not lift anything that is heavier than your baby or 10 lb (4.5 kg) until your health care provider says that it is safe. °· Talk with your health care provider about when you can engage in sexual activity. This may depend on your: °¨ Risk of infection. °¨ Rate of healing. °¨ Comfort and desire to engage in sexual activity. °Vaginal Care °· If you have an episiotomy or a vaginal tear, check the area every day for signs of infection. Check for: °¨ More redness, swelling, or pain. °¨ More fluid or blood. °¨ Warmth. °¨ Pus or a bad smell. °· Do not use tampons or douches until your health care provider says this is safe. °· Watch for any blood clots that may pass from your vagina. These may look like clumps of dark red, brown, or black discharge. °General instructions °· Keep your perineum clean and dry as told by your health care provider. °· Wear loose, comfortable clothing. °· Wipe from front to back when you use the toilet. °· Ask your health care provider if you can shower or take a bath. If you had an episiotomy or a perineal tear during labor and delivery, your health care provider may tell you not to take baths for a certain length of time. °· Wear a bra that supports your breasts and fits you well. °· If possible, have someone help you with household activities and help care for your baby for at least a few days after you leave   the hospital.  Keep all follow-up visits for you and your baby as told by your health care provider. This is important. Contact a health care provider if:  You have:  Vaginal discharge that has a bad smell.  Difficulty urinating.  Pain when urinating.  A sudden increase or decrease in the frequency of your bowel movements.  More redness, swelling, or pain around your episiotomy or vaginal tear.  More fluid or blood coming from your episiotomy or vaginal  tear.  Pus or a bad smell coming from your episiotomy or vaginal tear.  A fever.  A rash.  Little or no interest in activities you used to enjoy.  Questions about caring for yourself or your baby.  Your episiotomy or vaginal tear feels warm to the touch.  Your episiotomy or vaginal tear is separating or does not appear to be healing.  Your breasts are painful, hard, or turn red.  You feel unusually sad or worried.  You feel nauseous or you vomit.  You pass large blood clots from your vagina. If you pass a blood clot from your vagina, save it to show to your health care provider. Do not flush blood clots down the toilet without having your health care provider look at them.  You urinate more than usual.  You are dizzy or light-headed.  You have not breastfed at all and you have not had a menstrual period for 12 weeks after delivery.  You have stopped breastfeeding and you have not had a menstrual period for 12 weeks after you stopped breastfeeding. Get help right away if:  You have:  Pain that does not go away or does not get better with medicine.  Chest pain.  Difficulty breathing.  Blurred vision or spots in your vision.  Thoughts about hurting yourself or your baby.  You develop pain in your abdomen or in one of your legs.  You develop a severe headache.  You faint.  You bleed from your vagina so much that you fill two sanitary pads in one hour. This information is not intended to replace advice given to you by your health care provider. Make sure you discuss any questions you have with your health care provider. Document Released: 11/22/2000 Document Revised: 05/08/2016 Document Reviewed: 12/10/2015 Elsevier Interactive Patient Education  2017 [employer] Discharge instructions:   Call office if you have any of the following: headache, visual changes, fever >100 F, chills, breast concerns, excessive vaginal bleeding, incision drainage or problems, leg  pain or redness, depression or any other concerns.   Activity: Do not lift > 10 lbs for 6 weeks.  No intercourse or tampons for 6 weeks.  No driving for 1-2 weeks.

## 2017-10-14 ENCOUNTER — Emergency Department
Admission: EM | Admit: 2017-10-14 | Discharge: 2017-10-14 | Disposition: A | Discharge status: Home / Self Care | Payer: No Typology Code available for payment source | Attending: Emergency Medicine | Admitting: Emergency Medicine

## 2017-10-14 ENCOUNTER — Encounter: Payer: Self-pay | Admitting: Emergency Medicine

## 2017-10-14 DIAGNOSIS — Y999 Unspecified external cause status: Secondary | ICD-10-CM | POA: Diagnosis not present

## 2017-10-14 DIAGNOSIS — S39012A Strain of muscle, fascia and tendon of lower back, initial encounter: Secondary | ICD-10-CM | POA: Diagnosis not present

## 2017-10-14 DIAGNOSIS — Z87891 Personal history of nicotine dependence: Secondary | ICD-10-CM | POA: Diagnosis not present

## 2017-10-14 DIAGNOSIS — Y92410 Unspecified street and highway as the place of occurrence of the external cause: Secondary | ICD-10-CM | POA: Diagnosis not present

## 2017-10-14 DIAGNOSIS — Y9389 Activity, other specified: Secondary | ICD-10-CM | POA: Insufficient documentation

## 2017-10-14 DIAGNOSIS — S3992XA Unspecified injury of lower back, initial encounter: Secondary | ICD-10-CM | POA: Diagnosis present

## 2017-10-14 DIAGNOSIS — M79605 Pain in left leg: Secondary | ICD-10-CM | POA: Diagnosis not present

## 2017-10-14 MED ORDER — TRAMADOL HCL 50 MG PO TABS: 50.0000 mg | ORAL_TABLET | Freq: Four times a day (QID) | ORAL | 0 refills | Status: DC | PRN

## 2017-10-14 MED ORDER — CYCLOBENZAPRINE HCL 10 MG PO TABS: 10.0000 mg | ORAL_TABLET | Freq: Three times a day (TID) | ORAL | 0 refills | Status: DC | PRN

## 2017-10-14 MED ORDER — IBUPROFEN 800 MG PO TABS: 800.0000 mg | ORAL_TABLET | Freq: Three times a day (TID) | ORAL | 0 refills | Status: DC | PRN
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
## 2017-12-03 ENCOUNTER — Emergency Department: Payer: Self-pay

## 2017-12-03 ENCOUNTER — Other Ambulatory Visit: Payer: Self-pay

## 2017-12-03 ENCOUNTER — Emergency Department
Admission: EM | Admit: 2017-12-03 | Discharge: 2017-12-03 | Disposition: A | Discharge status: Home / Self Care | Payer: Self-pay | Attending: Emergency Medicine | Admitting: Emergency Medicine

## 2017-12-03 DIAGNOSIS — Z3400 Encounter for supervision of normal first pregnancy, unspecified trimester: Secondary | ICD-10-CM | POA: Insufficient documentation

## 2017-12-03 DIAGNOSIS — Z79899 Other long term (current) drug therapy: Secondary | ICD-10-CM | POA: Insufficient documentation

## 2017-12-03 DIAGNOSIS — Z3A13 13 weeks gestation of pregnancy: Secondary | ICD-10-CM | POA: Insufficient documentation

## 2017-12-03 DIAGNOSIS — O26851 Spotting complicating pregnancy, first trimester: Secondary | ICD-10-CM | POA: Insufficient documentation

## 2017-12-03 DIAGNOSIS — Z87891 Personal history of nicotine dependence: Secondary | ICD-10-CM | POA: Insufficient documentation

## 2017-12-03 DIAGNOSIS — O210 Mild hyperemesis gravidarum: Secondary | ICD-10-CM | POA: Insufficient documentation

## 2017-12-03 LAB — CBC
HCT: 37.3 % (ref 35.0–47.0)
Hemoglobin: 12.9 g/dL (ref 12.0–16.0)
MCH: 30.1 pg (ref 26.0–34.0)
MCHC: 34.6 g/dL (ref 32.0–36.0)
MCV: 87.1 fL (ref 80.0–100.0)
Platelets: 201 10*3/uL (ref 150–440)
RBC: 4.28 MIL/uL (ref 3.80–5.20)
RDW: 14.4 % (ref 11.5–14.5)
WBC: 5.7 10*3/uL (ref 3.6–11.0)

## 2017-12-03 LAB — COMPREHENSIVE METABOLIC PANEL
ALT: 19 U/L (ref 14–54)
AST: 18 U/L (ref 15–41)
Albumin: 3.6 g/dL (ref 3.5–5.0)
Alkaline Phosphatase: 50 U/L (ref 38–126)
Anion gap: 5 (ref 5–15)
BUN: 9 mg/dL (ref 6–20)
CO2: 24 mmol/L (ref 22–32)
Calcium: 8.7 mg/dL — ABNORMAL LOW (ref 8.9–10.3)
Chloride: 103 mmol/L (ref 101–111)
Creatinine, Ser: 0.47 mg/dL (ref 0.44–1.00)
GFR calc Af Amer: >60 mL/min (ref >60)
GFR calc non Af Amer: >60 mL/min (ref >60)
Glucose, Bld: 106 mg/dL — ABNORMAL HIGH (ref 65–99)
Potassium: 3.3 mmol/L — ABNORMAL LOW (ref 3.5–5.1)
Sodium: 132 mmol/L — ABNORMAL LOW (ref 135–145)
Total Bilirubin: 0.8 mg/dL (ref 0.3–1.2)
Total Protein: 7.8 g/dL (ref 6.5–8.1)

## 2017-12-03 LAB — HCG, QUANTITATIVE, PREGNANCY: hCG, Beta Chain, Quant, S: 107755 m[IU]/mL — ABNORMAL HIGH (ref <5)

## 2017-12-03 LAB — URINALYSIS, COMPLETE (UACMP) WITH MICROSCOPIC
Bacteria, UA: NONE SEEN
Bilirubin Urine: NEGATIVE
Glucose, UA: NEGATIVE mg/dL
Hgb urine dipstick: NEGATIVE
Ketones, ur: 5 mg/dL — AB
Nitrite: NEGATIVE
Protein, ur: 30 mg/dL — AB
Specific Gravity, Urine: 1.024 (ref 1.005–1.030)
pH: 6 (ref 5.0–8.0)

## 2017-12-03 LAB — POCT PREGNANCY, URINE: Preg Test, Ur: POSITIVE — AB

## 2017-12-03 MED ORDER — PROMETHAZINE HCL 25 MG PO TABS: 25.0000 mg | ORAL_TABLET | Freq: Four times a day (QID) | ORAL | 0 refills | Status: DC | PRN

## 2017-12-03 MED ORDER — PROMETHAZINE HCL 25 MG/ML IJ SOLN
25.0000 mg | Freq: Once | INTRAMUSCULAR | Status: AC
  Administered 2017-12-03: 25 mg via INTRAVENOUS
  Filled 2017-12-03: qty 1

## 2017-12-03 MED ORDER — SODIUM CHLORIDE 0.9 % IV BOLUS (SEPSIS)
1000.0000 mL | Freq: Once | INTRAVENOUS | Status: AC
  Administered 2017-12-03: 1000 mL via INTRAVENOUS

## 2017-12-03 NOTE — ED Notes (Signed)
Patient transported to Ultrasound 

## 2017-12-03 NOTE — ED Triage Notes (Signed)
Pt c/o N/V for the past 2 weeks, states she had a recent home pregnancy test. States that the vomiting has worsened and unable to hold anything down. Denies any abd pain or bleeding..Marland Kitchen

## 2017-12-03 NOTE — ED Provider Notes (Signed)
[facility] Emergency Department Provider Note  Time seen: 1:00 PM  I have reviewed the triage vital signs and the nursing notes.   HISTORY  Chief Complaint Emesis During Pregnancy    HPI Regina Chandler is a 26 y.o. female with a past medical history of anemia, anxiety, bipolar, depression, G6 P3 Ab2 who presents to the emergency department for vomiting, vaginal spotting, lower abdominal discomfort, and likely pregnancy.  According to the patient her last period was sometime to the middle to end of September.  States for the past several weeks she has had intermittent lower abdominal cramping as well as nausea and vomiting on a daily basis.  Patient took a home pregnancy test that was positive.  Patient also notes over the past 4 days she has had very slight vaginal spotting only seen when she wipes.  Patient has not yet established OB care.  Has not had an ultrasound.  Patient states with her past pregnancy she also required home Phenergan treatment for severe nausea.   Past Medical History:  Diagnosis Date  . Anemia   . Anxiety   . Bipolar 1 disorder (HCC)   . Depression   . GERD (gastroesophageal reflux disease)   . Headache   . Hepatitis B affecting pregnancy 2007   No current treatment required, titers show up negative    Patient Active Problem List   Diagnosis Date Noted  . Headache in pregnancy, antepartum, third trimester 11/24/2016  . Gestational hypertension without significant proteinuria 11/24/2016  . Indication for care in labor and delivery, antepartum 11/06/2016  . Obesity affecting pregnancy in third trimester, antepartum 10/25/2016  . Obesity affecting pregnancy in third trimester 10/18/2016  . Nausea and vomiting of pregnancy, antepartum 07/08/2016  . First trimester screening 05/20/2016    Past Surgical History:  Procedure Laterality Date  . NO PAST SURGERIES      Prior to Admission medications   Medication Sig Start Date End  Date Taking? Authorizing Provider  cyclobenzaprine (FLEXERIL) 10 MG tablet Take 1 tablet (10 mg total) 3 (three) times daily as needed by mouth. 10/14/17   Joni ReiningSmith, Ronald K, PA-C  ibuprofen (ADVIL,MOTRIN) 800 MG tablet Take 1 tablet (800 mg total) every 8 (eight) hours as needed by mouth for moderate pain. 10/14/17   Joni ReiningSmith, Ronald K, PA-C  Prenatal Vit-Fe Fumarate-FA (PRENATAL MULTIVITAMIN) TABS tablet Take 1 tablet by mouth daily at 12 noon.    [provider]  ranitidine (ZANTAC) 150 MG tablet Take 150 mg by mouth 2 (two) times daily.    [provider]  traMADol (ULTRAM) 50 MG tablet Take 1 tablet (50 mg total) every 6 (six) hours as needed by mouth. 10/14/17 10/14/18  Joni ReiningSmith, Ronald K, PA-C    Allergies  Allergen Reactions  . Penicillins Swelling    Has patient had a PCN reaction causing immediate rash, facial/tongue/throat swelling, SOB or lightheadedness with hypotension: No Has patient had a PCN reaction causing severe rash involving mucus membranes or skin necrosis: No Has patient had a PCN reaction that required hospitalization No Has patient had a PCN reaction occurring within the last 10 years: No If all of the above answers are "NO", then may proceed with Cephalosporin use.    No family history on file.  Social History Social History   Tobacco Use  . Smoking status: Former Games developermoker  . Smokeless tobacco: Never Used  Substance Use Topics  . Alcohol use: No  . Drug use: No  Review of Systems Constitutional: Negative for fever. Eyes: Negative for visual changes. ENT: Negative for congestion Cardiovascular: Negative for chest pain. Respiratory: Negative for shortness of breath. Gastrointestinal: Positive for lower abdominal cramping.  Positive for nausea and vomiting. Genitourinary: Negative for dysuria.  Positive for vaginal spotting. Musculoskeletal: Negative for back pain. Skin: Negative for rash. Neurological: Mild intermittent headaches. All other ROS  negative  ____________________________________________   PHYSICAL EXAM:  VITAL SIGNS: ED Triage Vitals [12/03/17 1053]  Enc Vitals Group     BP 139/86     Pulse Rate 89     Resp 18     Temp 98.5 F (36.9 C)     Temp Source Oral     SpO2 100 %     Weight 298 lb (135.2 kg)     Height 5\' 6"  (1.676 m)     Head Circumference      Peak Flow      Pain Score 7     Pain Loc      Pain Edu?      Excl. in GC?    Constitutional: Alert and oriented. Well appearing and in no distress. Eyes: Normal exam ENT   Head: Normocephalic and atraumatic.   Nose: No congestion/rhinnorhea.   Mouth/Throat: Mucous membranes are moist. Cardiovascular: Normal rate, regular rhythm. No murmurs, rubs, or gallops. Respiratory: Normal respiratory effort without tachypnea nor retractions. Breath sounds are clear and equal bilaterally. No wheezes/rales/rhonchi. Gastrointestinal: Soft, slight suprapubic tenderness, no rebound or guarding.  No distention.  Abdomen otherwise benign. Musculoskeletal: Nontender with normal range of motion in all extremities Neurologic:  Normal speech and language. No gross focal neurologic deficits are appreciated. Skin:  Skin is warm, dry and intact.  Psychiatric: Mood and affect are normal. Speech and behavior are normal.   ____________________________________________   RADIOLOGY  Ultrasound shows single live IUP at 13 weeks and 2 days.  ____________________________________________   INITIAL IMPRESSION / ASSESSMENT AND PLAN / ED COURSE  Pertinent labs & imaging results that were available during my care of the patient were reviewed by me and considered in my medical decision making (see chart for details).  Patient presents to the emergency department for nausea vomiting as well as vaginal spotting approximately 12-[redacted] weeks pregnant per patient.  Differential would include threatened miscarriage, miscarriage, ectopic pregnancy, hyperemesis gravidarum, morning  sickness, gastroenteritis.  We will check labs, IV hydrate, dose Phenergan in the emergency department and obtain an ultrasound.  I reviewed the patient's records she has a positive blood type, RhoGam is not needed.  Patient's labs have resulted largely within normal limits beta-hCG is elevated at 107,000.  Ultrasound shows single live IUP at 13 weeks and 2 days otherwise normal.  Otherwise normal workup.  We will discharged with a prescription for Phenergan.  Discussed risk and benefits patient agreeable to use.  I also discussed OB follow-up. ____________________________________________   FINAL CLINICAL IMPRESSION(S) / ED DIAGNOSES  Threatened miscarriage Hyperemesis gravidarum    Minna AntisPaduchowski, Chai Routh, MD 12/03/17 [phone]

## 2017-12-03 NOTE — ED Notes (Signed)
Patient to room 24, Regina RiegerLaura RN aware of placement.

## 2017-12-09 NOTE — L&D Delivery Note (Signed)
Delivery Note   Regina Fowlernastasia J Goodness is a 27 y.o. [id number] at 7352w6d Estimated Date of Delivery: 06/08/18  PRE-OPERATIVE DIAGNOSIS:  1) 4952w6d pregnancy.   POST-OPERATIVE DIAGNOSIS:  1) 3652w6d pregnancy s/p Vaginal, Spontaneous   Delivery Type: Vaginal, Spontaneous , delivered for Dr.Stabler ( in operating room)   Delivery Anesthesia: Epidural   Labor Complications: None     ESTIMATED BLOOD LOSS: 200 ml    FINDINGS:   1) female infant, Apgar scores of 9    at 1 minute and 9    at 5 minutes and a birthweight of    ounces.    2) Nuchal cord: No  SPECIMENS:   PLACENTA:   Appearance: Intact  , 3 vessel cord, cord blood sample collected   Removal: Spontaneous      Disposition:   Held per protocol , then discarded   DISPOSITION:  Infant to left in stable condition in the delivery room, with L&D personnel and mother,  NARRATIVE SUMMARY: Labor course:  [name] is a [id number] at 1952w6d who presented for labor management.  She progressed well in labor without pitocin.  She received the appropriate anesthesia and proceeded to complete dilation. I was called to room by RN for delivery. Dr. Chauncey CruelStabler in operating room. Fetal head on perineum. She evidenced good maternal expulsive effort during the second stage. Pushed x 1.  Head presented in OA position, restituted to LOA . Second push resulted in delivery of anterior shoulder then posterior shoulder with ease. She  Delivered a viable female infant. Infant to maternal abdomen. Stimulated, noted to be vigorous and crying. The placenta delivered without problems and was noted to be complete. A perineal and vaginal examination was performed. Episiotomy/Lacerations: None . The patient tolerated this well.  Doreene Burkennie Kaan Tosh, CNM  05/31/2018 10:31 AM

## 2017-12-19 ENCOUNTER — Ambulatory Visit (INDEPENDENT_AMBULATORY_CARE_PROVIDER_SITE_OTHER): Payer: Self-pay | Admitting: Obstetrics and Gynecology

## 2017-12-19 ENCOUNTER — Encounter: Payer: Self-pay | Admitting: Obstetrics and Gynecology

## 2017-12-19 VITALS — BP 130/82 | HR 71 | Wt 307.0 lb

## 2017-12-19 DIAGNOSIS — O99891 Other specified diseases and conditions complicating pregnancy: Secondary | ICD-10-CM | POA: Insufficient documentation

## 2017-12-19 DIAGNOSIS — Z6841 Body Mass Index (BMI) 40.0 and over, adult: Secondary | ICD-10-CM | POA: Insufficient documentation

## 2017-12-19 DIAGNOSIS — O0990 Supervision of high risk pregnancy, unspecified, unspecified trimester: Secondary | ICD-10-CM | POA: Insufficient documentation

## 2017-12-19 DIAGNOSIS — Z3A15 15 weeks gestation of pregnancy: Secondary | ICD-10-CM

## 2017-12-19 DIAGNOSIS — B181 Chronic viral hepatitis B without delta-agent: Secondary | ICD-10-CM

## 2017-12-19 DIAGNOSIS — O99210 Obesity complicating pregnancy, unspecified trimester: Secondary | ICD-10-CM

## 2017-12-19 DIAGNOSIS — R8290 Unspecified abnormal findings in urine: Secondary | ICD-10-CM

## 2017-12-19 DIAGNOSIS — M549 Dorsalgia, unspecified: Secondary | ICD-10-CM | POA: Insufficient documentation

## 2017-12-19 DIAGNOSIS — Z8619 Personal history of other infectious and parasitic diseases: Secondary | ICD-10-CM

## 2017-12-19 DIAGNOSIS — O9989 Other specified diseases and conditions complicating pregnancy, childbirth and the puerperium: Secondary | ICD-10-CM

## 2017-12-19 DIAGNOSIS — O10919 Unspecified pre-existing hypertension complicating pregnancy, unspecified trimester: Secondary | ICD-10-CM

## 2017-12-19 DIAGNOSIS — O98419 Viral hepatitis complicating pregnancy, unspecified trimester: Secondary | ICD-10-CM

## 2017-12-19 DIAGNOSIS — Z124 Encounter for screening for malignant neoplasm of cervix: Secondary | ICD-10-CM

## 2017-12-19 HISTORY — DX: Viral hepatitis complicating pregnancy, unspecified trimester: B18.1

## 2017-12-19 LAB — POCT URINALYSIS DIPSTICK
Bilirubin, UA: NEGATIVE
Blood, UA: NEGATIVE
Glucose, UA: NEGATIVE
Ketones, UA: NEGATIVE
Nitrite, UA: NEGATIVE
Protein, UA: NEGATIVE
Spec Grav, UA: 1.01 (ref 1.010–1.025)
Urobilinogen, UA: 0.2 E.U./dL
pH, UA: 7 (ref 5.0–8.0)

## 2017-12-19 MED ORDER — PROMETHAZINE HCL 25 MG PO TABS: 25.0000 mg | ORAL_TABLET | Freq: Four times a day (QID) | ORAL | 3 refills | Status: DC | PRN

## 2017-12-19 MED ORDER — FOLIC ACID 1 MG PO TABS: 1.0000 mg | ORAL_TABLET | Freq: Every day | ORAL | 10 refills | Status: DC

## 2017-12-19 MED ORDER — PYRIDOXINE HCL 25 MG PO TABS: 25.0000 mg | ORAL_TABLET | Freq: Four times a day (QID) | ORAL | 3 refills | Status: DC

## 2017-12-19 MED ORDER — DOXYLAMINE SUCCINATE (SLEEP) 25 MG PO TABS: 25.0000 mg | ORAL_TABLET | Freq: Every day | ORAL | 3 refills | Status: DC

## 2017-12-19 NOTE — Progress Notes (Signed)
12/19/2017   Chief Complaint: Missed period  Transfer of Care Patient: no  History of Present Illness: Regina Chandler is a 27 y.o. [id number] [redacted]w[redacted]d based on Patient's last menstrual period was 09/01/2017 (exact date). with an Estimated Date of Delivery: 06/08/18, with the above CC.   Her periods were: regular periods every 28 days She was using no method when she conceived.  She has Positive signs or symptoms of nausea/vomiting of pregnancy. She has Negative signs or symptoms of miscarriage or preterm labor She identifies Negative Zika risk factors for her and her partner On any different medications around the time she conceived/early pregnancy: No  History of varicella: Yes   ROS: A 12-point review of systems was performed and negative, except as stated in the above HPI.  OBGYN History: As per HPI. OB History  Gravida Para Term Preterm AB Living  6 3 2 1 2 3   SAB TAB Ectopic Multiple Live Births  2     0 3    # Outcome Date GA Lbr Len/2nd Weight Sex Delivery Anes PTL Lv  6 Current           5 Term 11/25/16 [redacted]w[redacted]d 08:09 / 00:08 6 lb 14.8 oz (3.14 kg) F Vag-Spont EPI  LIV  4 Preterm 03/08/12 [redacted]w[redacted]d  6 lb (2.722 kg) F Vag-Spont   LIV  3 Term 01/10/11 [redacted]w[redacted]d  7 lb 10 oz (3.459 kg) M Vag-Spont   LIV  2 SAB 2010          1 SAB 2006              Any issues with any prior pregnancies: no Any prior children are healthy, doing well, without any problems or issues: no History of pap smears: Yes. Last pap smear unknown by patient History of STIs: No   Past Medical History: Past Medical History:  Diagnosis Date  . Anemia   . Anxiety   . Bipolar 1 disorder (HCC)   . Depression   . GERD (gastroesophageal reflux disease)   . Headache   . Hepatitis B affecting pregnancy 2007   No current treatment required, titers show up negative    Past Surgical History: Past Surgical History:  Procedure Laterality Date  . NO PAST SURGERIES    . NO PAST SURGERIES      Family History:  Family  History  Problem Relation Age of Onset  . Cancer Neg Hx   . Diabetes Neg Hx   . Heart disease Neg Hx   . Stroke Neg Hx    She denies any female cancers, bleeding or blood clotting disorders.  She denies any history of mental retardation, birth defects or genetic disorders in her or the FOB's history  Social History:  Social History   Socioeconomic History  . Marital status: Single    Spouse name: Not on file  . Number of children: Not on file  . Years of education: Not on file  . Highest education level: Not on file  Social Needs  . Financial resource strain: Not on file  . Food insecurity - worry: Not on file  . Food insecurity - inability: Not on file  . Transportation needs - medical: Not on file  . Transportation needs - non-medical: Not on file  Occupational History  . Not on file  Tobacco Use  . Smoking status: Former Games developer  . Smokeless tobacco: Never Used  Substance and Sexual Activity  . Alcohol use: No  . Drug use: No  .  Sexual activity: Yes    Birth control/protection: None  Other Topics Concern  . Not on file  Social History Narrative  . Not on file   Any pets in the household: no    Allergy: Allergies  Allergen Reactions  . Penicillins Swelling    Has patient had a PCN reaction causing immediate rash, facial/tongue/throat swelling, SOB or lightheadedness with hypotension: No Has patient had a PCN reaction causing severe rash involving mucus membranes or skin necrosis: No Has patient had a PCN reaction that required hospitalization No Has patient had a PCN reaction occurring within the last 10 years: No If all of the above answers are "NO", then may proceed with Cephalosporin use.    Current Outpatient Medications:  Current Outpatient Medications:  .  Prenatal Vit-Fe Fumarate-FA (PRENATAL MULTIVITAMIN) TABS tablet, Take 1 tablet by mouth daily at 12 noon., Disp: , Rfl:  .  doxylamine, Sleep, (UNISOM) 25 MG tablet, Take 1 tablet (25 mg total) by  mouth at bedtime., Disp: 30 tablet, Rfl: 3 .  promethazine (PHENERGAN) 25 MG tablet, Take 1 tablet (25 mg total) by mouth every 6 (six) hours as needed for nausea or vomiting., Disp: 60 tablet, Rfl: 3 .  pyridOXINE (VITAMIN B-6) 25 MG tablet, Take 1 tablet (25 mg total) by mouth QID., Disp: 120 tablet, Rfl: 3 .  ranitidine (ZANTAC) 150 MG tablet, Take 150 mg by mouth 2 (two) times daily., Disp: , Rfl:   Current Facility-Administered Medications:  .  docusate sodium (COLACE) capsule 100 mg, 100 mg, Oral, BID, Jimmey RalphLivingston, Elizabeth, MD   Physical Exam:   BP 130/82   Pulse 71   Wt (!) 307 lb (139.3 kg)   LMP 09/01/2017 (Exact Date)   BMI 49.55 kg/m  Body mass index is 49.55 kg/m. Constitutional: Well nourished, well developed female in no acute distress.  Neck:  Supple, normal appearance, and no thyromegaly  Cardiovascular: S1, S2 normal, no murmur, rub or gallop, regular rate and rhythm Respiratory:  Clear to auscultation bilateral. Normal respiratory effort Abdomen: positive bowel sounds and no masses, hernias; diffusely non tender to palpation, non distended Breasts: breasts appear normal, no suspicious masses, no skin or nipple changes or axillary nodes. Neuro/Psych:  Normal mood and affect.  Skin:  Warm and dry.  Lymphatic:  No inguinal lymphadenopathy.   Pelvic exam: is limited by body habitus EGBUS: within normal limits, Vagina: within normal limits and with no blood in the vault, Cervix: normal appearing cervix without discharge or lesions, closed/long/high, Uterus:  Can not evaluated because of body habitus, and Adnexa:  can not evaluate because of body habitus  Bedside US showed IUP at 15 weeks 5 days by femur length.   Assessment: Regina Chandler is a 27 y.o. [id number] 6772w4d based on Patient's last menstrual period was 09/01/2017 (exact date). with an Estimated Date of Delivery: 06/08/18,  for prenatal care.  Plan:  1) Avoid alcoholic beverages. 2) Patient encouraged not to smoke.   3) Discontinue the use of all non-medicinal drugs and chemicals.  4) Take prenatal vitamins daily.  5) Seatbelt use advised 6) Nutrition, food safety (fish, cheese advisories, and high nitrite foods) and exercise discussed. 7) [facility] and [facility] discussed  8) Patient is asked about travel to areas at risk for the Zika virus, and counseled to avoid travel and exposure to mosquitoes or sexual partners who may have themselves been exposed to the virus. Testing is discussed, and will be ordered as appropriate.  9)  Childbirth classes at [facility] advised 10) Genetic Screening, such as with 1st Trimester Screening, cell free fetal DNA, AFP testing, and Ultrasound, as well as with amniocentesis and [facility] as appropriate, is discussed with patient. She is considering genetic testing this pregnancy. 11) hepatitis B- will order CMP to evaluate liver function. Avoid fetal scalp electrode in labor 12) Morbid obesity- discussed weight goal as to maintain current weight and even loose weight in pregnancy. Nutritional consult made. Folic acid order.  EKG ordered. Early 1GTT ordered. 13) CHTN- no hx of medication usage with previous pregnancies. Denies a history of preeclampsia. Discussed initiating a daily 81mg  Baby aspirin. 14) Nausea and vomiting. Prescribes B6 and unisom. 15) Waiting on NOB panel for insurance. 16) [place] for dating and low lying placenta, return in 2 weeks.  17) Back pain- referral to physical therapy  Problem list reviewed and updated.  Adelene Idler, MD [facility], [facility] 12/19/2017  1:42 PM

## 2017-12-21 LAB — URINE DRUG PANEL 7
Amphetamines, Urine: NEGATIVE ng/mL
Barbiturate Quant, Ur: NEGATIVE ng/mL
Benzodiazepine Quant, Ur: NEGATIVE ng/mL
Cannabinoid Quant, Ur: NEGATIVE ng/mL
Cocaine (Metab.): NEGATIVE ng/mL
Opiate Quant, Ur: NEGATIVE ng/mL
PCP Quant, Ur: NEGATIVE ng/mL

## 2017-12-21 LAB — URINE CULTURE: Organism ID, Bacteria: NO GROWTH

## 2017-12-22 NOTE — Progress Notes (Signed)
Released to Mychart, normal.

## 2017-12-24 LAB — PAP IG, CT-NG, RFX HPV ASCU
Chlamydia, Nuc. Acid Amp: POSITIVE — AB
Gonococcus by Nucleic Acid Amp: NEGATIVE
PAP Smear Comment: 0

## 2017-12-26 ENCOUNTER — Other Ambulatory Visit: Payer: Self-pay | Admitting: Obstetrics and Gynecology

## 2017-12-26 ENCOUNTER — Ambulatory Visit: Payer: Self-pay

## 2017-12-26 DIAGNOSIS — B373 Candidiasis of vulva and vagina: Secondary | ICD-10-CM

## 2017-12-26 DIAGNOSIS — B3731 Acute candidiasis of vulva and vagina: Secondary | ICD-10-CM

## 2017-12-26 DIAGNOSIS — O98811 Other maternal infectious and parasitic diseases complicating pregnancy, first trimester: Principal | ICD-10-CM

## 2017-12-26 DIAGNOSIS — A749 Chlamydial infection, unspecified: Secondary | ICD-10-CM

## 2017-12-26 MED ORDER — TERCONAZOLE 0.4 % VA CREA: 1.0000 | TOPICAL_CREAM | Freq: Every day | VAGINAL | 0 refills | Status: DC

## 2017-12-26 MED ORDER — AZITHROMYCIN 500 MG PO TABS: 1000.0000 mg | ORAL_TABLET | Freq: Once | ORAL | 0 refills | Status: AC

## 2017-12-26 NOTE — Progress Notes (Signed)
Thank you :)

## 2017-12-26 NOTE — Progress Notes (Signed)
If you can give this girl a ring too that would be great. I left a message on the phone and sent a mychart message as well. I sent prescriptions to [facility] for [facility].  Thank you, Dr. Jerene PitchSchuman

## 2017-12-26 NOTE — Progress Notes (Signed)
Called, no answer, left message to check mychart. Sent prescriptions to pharmacy. Will need repeat pap.

## 2018-01-02 ENCOUNTER — Encounter: Payer: Self-pay | Admitting: Maternal Newborn

## 2018-01-02 ENCOUNTER — Other Ambulatory Visit: Payer: Self-pay

## 2018-01-09 ENCOUNTER — Other Ambulatory Visit: Payer: Self-pay

## 2018-01-09 ENCOUNTER — Encounter: Payer: Self-pay | Admitting: Advanced Practice Midwife

## 2018-01-15 ENCOUNTER — Other Ambulatory Visit: Payer: Self-pay | Admitting: Obstetrics and Gynecology

## 2018-01-15 DIAGNOSIS — Z363 Encounter for antenatal screening for malformations: Secondary | ICD-10-CM

## 2018-01-15 DIAGNOSIS — O0990 Supervision of high risk pregnancy, unspecified, unspecified trimester: Secondary | ICD-10-CM

## 2018-01-16 ENCOUNTER — Ambulatory Visit: Payer: Medicaid Other | Attending: Obstetrics and Gynecology | Admitting: Physical Therapy

## 2018-01-19 ENCOUNTER — Ambulatory Visit (INDEPENDENT_AMBULATORY_CARE_PROVIDER_SITE_OTHER): Payer: Medicaid Other | Admitting: Advanced Practice Midwife

## 2018-01-19 ENCOUNTER — Ambulatory Visit (INDEPENDENT_AMBULATORY_CARE_PROVIDER_SITE_OTHER): Payer: Medicaid Other

## 2018-01-19 ENCOUNTER — Encounter: Payer: Self-pay | Admitting: Advanced Practice Midwife

## 2018-01-19 VITALS — BP 134/88 | Wt 310.0 lb

## 2018-01-19 DIAGNOSIS — Z363 Encounter for antenatal screening for malformations: Secondary | ICD-10-CM

## 2018-01-19 DIAGNOSIS — IMO0002 Reserved for concepts with insufficient information to code with codable children: Secondary | ICD-10-CM

## 2018-01-19 DIAGNOSIS — O0990 Supervision of high risk pregnancy, unspecified, unspecified trimester: Secondary | ICD-10-CM

## 2018-01-19 DIAGNOSIS — Z3A20 20 weeks gestation of pregnancy: Secondary | ICD-10-CM

## 2018-01-19 DIAGNOSIS — Z0489 Encounter for examination and observation for other specified reasons: Secondary | ICD-10-CM

## 2018-01-19 NOTE — Progress Notes (Signed)
Anatomy scan today. No vb no lof

## 2018-01-19 NOTE — Progress Notes (Signed)
Routine Prenatal Care Visit  Subjective  Regina Chandler is a 27 y.o. [id number] at [redacted]w[redacted]d being seen today for ongoing prenatal care.  She is currently monitored for the following issues for this high-risk pregnancy and has First trimester screening; Nausea and vomiting of pregnancy, antepartum; Obesity affecting pregnancy in second trimester; Indication for care in labor and delivery, antepartum; Headache in pregnancy, antepartum, third trimester; Supervision of high risk pregnancy, antepartum; Back pain affecting pregnancy in second trimester; Hx of type B viral hepatitis; BMI 45.0-49.9, adult (HCC); Obesity in pregnancy, antepartum; and Chronic hypertension during pregnancy, antepartum on their problem list.  ----------------------------------------------------------------------------------- Patient reports fatigue and heartburn.   Contractions: Not present. Vag. Bleeding: None.  Movement: Present. Denies leaking of fluid.  ----------------------------------------------------------------------------------- The following portions of the patient's history were reviewed and updated as appropriate: allergies, current medications, past family history, past medical history, past social history, past surgical history and problem list. Problem list updated.   Objective  Blood pressure 134/88, weight (!) 310 lb (140.6 kg), last menstrual period 09/01/2017, unknown if currently breastfeeding. Pregravid weight 307 lb (139.3 kg) Total Weight Gain 3 lb (1.361 kg) Urinalysis: Urine Protein: Negative Urine Glucose: Negative  Fetal Status: Fetal Heart Rate (bpm): 144   Movement: Present     Anatomy scan today: incomplete for upper extremities, 4 CH, LVOT, diaphragm  General:  Alert, oriented and cooperative. Patient is in no acute distress.  Skin: Skin is warm and dry. No rash noted.   Cardiovascular: Normal heart rate noted  Respiratory: Normal respiratory effort, no problems with respiration noted    Abdomen: Soft, gravid, appropriate for gestational age. Pain/Pressure: Present     Pelvic:  Cervical exam deferred        Extremities: Normal range of motion.     Mental Status: Normal mood and affect. Normal behavior. Normal judgment and thought content.   Assessment   27 y.o. [id number] at [redacted]w[redacted]d by  06/08/2018, by Last Menstrual Period presenting for routine prenatal visit  Plan   pregnancy #6 Problems (from 12/19/17 to present)    Problem Noted Resolved   Obesity in pregnancy, antepartum 12/19/2017 by Natale Milch, MD No   Overview Addendum 12/19/2017  4:55 PM by Natale Milch, MD    BMI >=40 [ ]  early 1h gtt -  [ ]  u/s for dating [ ]   Arly.Keller ] nutritional goals Arly.Keller ] folic acid 1mg  Arly.Keller ] bASA (>12 weeks) Arly.Keller ] consider nutrition consult [ ]  consider maternal EKG 1st trimester [ ]  Growth u/s 28 [ ] , 32 [ ] , 36 weeks [ ]  [ ]  NST/AFI weekly 36+ weeks (36[] , 37[] , 38[] , 39[] , 40[] ) [ ]  IOL by 41 weeks (scheduled, prn [] )       Chronic hypertension during pregnancy, antepartum 12/19/2017 by Natale Milch, MD No   Overview Signed 12/19/2017  4:52 PM by Natale Milch, MD    Arly.Keller ] Aspirin 81 mg daily after 12 weeks; discontinue after 36 weeks [ ]  baseline labs with CBC, CMP, urine protein/creatinine ratio [ ]  no BP meds unless BPs become elevated [ ]  ultrasound for growth at 28, 32, 36 weeks [ ]  Aspirin 81 mg daily after 12 weeks; discontinue after 36 weeks [ ]  Baseline EKG   Current antihypertensives:  None   Baseline and surveillance labs (pulled in from [facility], refresh links as needed)  Lab Results  Component Value Date   PLT 201 12/03/2017   CREATININE 0.47 12/03/2017   AST 18  12/03/2017   ALT 19 12/03/2017   PROTCRRATIO 0.13 11/24/2016    Antenatal Testing CHTN - O10.919  Group I  BP < 140/90, no preeclampsia, AGA,  nml AFV, +/- meds    Group II BP > 140/90, on meds, no preeclampsia, AGA, nml AFV  20-28-34-38  20-24-28-32-35-38  32//2 x  wk  28//BPP wkly then 32//2 x wk  40 no meds; 39 meds  PRN or 37  Pre-eclampsia  GHTN - O13.9/Preeclampsia without severe features  - O14.00   Preeclampsia with severe features - O14.10  Q 3-4wks  Q 2 wks  28//BPP wkly then 32//2 x wk  Inpatient  37  PRN or 34         Obesity affecting pregnancy in second trimester 10/18/2016 by Nadara MustardHarris, Robert P, MD No       Preterm labor symptoms and general obstetric precautions including but not limited to vaginal bleeding, contractions, leaking of fluid and fetal movement were reviewed in detail with the patient.  PNV with Fe Eat Fe rich foods Adequate hydration Adequate sleep Comfort measures and safe OTC meds for heartburn    Return in about 4 weeks (around 02/16/2018) for f/u anatomy scan and rob.  Tresea MallJane Eola Waldrep, CNM  01/19/2018 11:57 AM

## 2018-01-22 ENCOUNTER — Encounter: Payer: Self-pay | Admitting: Physical Therapy

## 2018-01-23 ENCOUNTER — Encounter: Payer: Self-pay | Admitting: Physical Therapy

## 2018-01-27 ENCOUNTER — Ambulatory Visit: Payer: Medicaid Other | Admitting: Physical Therapy

## 2018-01-29 ENCOUNTER — Encounter: Payer: Medicaid Other | Admitting: Dietician

## 2018-02-02 ENCOUNTER — Ambulatory Visit: Payer: Medicaid Other | Admitting: Dietician

## 2018-02-05 ENCOUNTER — Ambulatory Visit: Payer: Medicaid Other | Admitting: Physical Therapy

## 2018-02-06 ENCOUNTER — Ambulatory Visit: Payer: Medicaid Other | Admitting: Dietician

## 2018-02-12 ENCOUNTER — Emergency Department
Admission: EM | Admit: 2018-02-12 | Discharge: 2018-02-12 | Disposition: A | Discharge status: Home / Self Care | Payer: Medicaid Other | Attending: Emergency Medicine | Admitting: Emergency Medicine

## 2018-02-12 ENCOUNTER — Other Ambulatory Visit: Payer: Self-pay

## 2018-02-12 DIAGNOSIS — J101 Influenza due to other identified influenza virus with other respiratory manifestations: Secondary | ICD-10-CM | POA: Insufficient documentation

## 2018-02-12 DIAGNOSIS — F419 Anxiety disorder, unspecified: Secondary | ICD-10-CM | POA: Diagnosis not present

## 2018-02-12 DIAGNOSIS — Z79899 Other long term (current) drug therapy: Secondary | ICD-10-CM | POA: Diagnosis not present

## 2018-02-12 DIAGNOSIS — Z87891 Personal history of nicotine dependence: Secondary | ICD-10-CM | POA: Insufficient documentation

## 2018-02-12 DIAGNOSIS — F319 Bipolar disorder, unspecified: Secondary | ICD-10-CM | POA: Insufficient documentation

## 2018-02-12 DIAGNOSIS — J111 Influenza due to unidentified influenza virus with other respiratory manifestations: Secondary | ICD-10-CM

## 2018-02-12 DIAGNOSIS — R0981 Nasal congestion: Secondary | ICD-10-CM | POA: Diagnosis present

## 2018-02-12 LAB — INFLUENZA PANEL BY PCR (TYPE A & B)
Influenza A By PCR: POSITIVE — AB
Influenza B By PCR: NEGATIVE

## 2018-02-12 MED ORDER — OSELTAMIVIR PHOSPHATE 75 MG PO CAPS: 75.0000 mg | ORAL_CAPSULE | Freq: Two times a day (BID) | ORAL | 0 refills | Status: AC

## 2018-02-12 NOTE — ED Provider Notes (Signed)
[facility] Emergency Department Provider Note   ____________________________________________   First MD Initiated Contact with Patient 02/12/18 [phone]     (approximate)  I have reviewed the triage vital signs and the nursing notes.   HISTORY  Chief Complaint URI    HPI Regina Chandler is a 27 y.o. female patient presents with nasal congestion, sore throat, cough, and body aches for 3 days.  Patient is 23 weeks of gestation.  Patient state intimating fever and chills.  Patient states nausea secondary to gas station.  Patient has a history of reflux.  Patient denies diarrhea.  No palates measured for complaint.  Patient rates the pain as a 7/10.  Patient described the pain is "aching".  Past Medical History:  Diagnosis Date  . Anemia   . Anxiety   . Bipolar 1 disorder (HCC)   . Depression   . GERD (gastroesophageal reflux disease)   . Headache   . Hepatitis B affecting pregnancy 2007   No current treatment required, titers show up negative    Patient Active Problem List   Diagnosis Date Noted  . Supervision of high risk pregnancy, antepartum 12/19/2017  . Back pain affecting pregnancy in second trimester 12/19/2017  . Hx of type B viral hepatitis 12/19/2017  . BMI 45.0-49.9, adult (HCC) 12/19/2017  . Obesity in pregnancy, antepartum 12/19/2017  . Chronic hypertension during pregnancy, antepartum 12/19/2017  . Headache in pregnancy, antepartum, third trimester 11/24/2016  . Indication for care in labor and delivery, antepartum 11/06/2016  . Obesity affecting pregnancy in second trimester 10/18/2016  . Nausea and vomiting of pregnancy, antepartum 07/08/2016  . First trimester screening 05/20/2016    Past Surgical History:  Procedure Laterality Date  . NO PAST SURGERIES    . NO PAST SURGERIES      Prior to Admission medications   Medication Sig Start Date End Date Taking? Authorizing Provider  doxylamine, Sleep, (UNISOM) 25 MG tablet Take  1 tablet (25 mg total) by mouth at bedtime. 12/19/17   Schuman, Jaquelyn Bitterhristanna R, MD  folic acid (FOLVITE) 1 MG tablet Take 1 tablet (1 mg total) by mouth daily. 12/19/17   Schuman, Jaquelyn Bitterhristanna R, MD  oseltamivir (TAMIFLU) 75 MG capsule Take 1 capsule (75 mg total) by mouth 2 (two) times daily for 5 days. 02/12/18 02/17/18  Joni ReiningSmith, Ivery Michalski K, PA-C  Prenatal Vit-Fe Fumarate-FA (PRENATAL MULTIVITAMIN) TABS tablet Take 1 tablet by mouth daily at 12 noon.    [provider]  promethazine (PHENERGAN) 25 MG tablet Take 1 tablet (25 mg total) by mouth every 6 (six) hours as needed for nausea or vomiting. 12/19/17   Schuman, Christanna R, MD  pyridOXINE (VITAMIN B-6) 25 MG tablet Take 1 tablet (25 mg total) by mouth QID. 12/19/17   Schuman, Christanna R, MD  ranitidine (ZANTAC) 150 MG tablet Take 150 mg by mouth 2 (two) times daily.    [provider]  terconazole (TERAZOL 7) 0.4 % vaginal cream Place 1 applicator vaginally at bedtime. 12/26/17   Natale MilchSchuman, Christanna R, MD    Allergies Penicillins  Family History  Problem Relation Age of Onset  . Cancer Neg Hx   . Diabetes Neg Hx   . Heart disease Neg Hx   . Stroke Neg Hx     Social History Social History   Tobacco Use  . Smoking status: Former Games developermoker  . Smokeless tobacco: Never Used  Substance Use Topics  . Alcohol use: No  . Drug use: No  Review of Systems  Constitutional: No fever/chills.  Body aches Eyes: No visual changes. ENT: Nasal congestion and sore throat. Cardiovascular: Denies chest pain. Respiratory: Nonproductive cough Gastrointestinal: No abdominal pain.  Nausea and vomiting.  No diarrhea.  No constipation. Genitourinary: Negative for dysuria. Musculoskeletal: Negative for back pain. Skin: Negative for rash. Neurological: Negative for headaches, focal weakness or numbness. Allergic/Immunilogical: Penicillin ____________________________________________   PHYSICAL EXAM:  VITAL SIGNS: ED Triage Vitals  Enc  Vitals Group     BP 02/12/18 1006 (!) 145/83     Pulse Rate 02/12/18 1004 94     Resp 02/12/18 1004 16     Temp 02/12/18 1004 97.7 F (36.5 C)     Temp Source 02/12/18 1004 Oral     SpO2 02/12/18 1004 100 %     Weight 02/12/18 1005 (!) 304 lb (137.9 kg)     Height 02/12/18 1005 5\' 5"  (1.651 m)     Head Circumference --      Peak Flow --      Pain Score 02/12/18 1005 7     Pain Loc --      Pain Edu? --      Excl. in GC? --    Constitutional: Alert and oriented. Well appearing and in no acute distress. Eyes: Conjunctivae are normal. PERRL. EOMI. Head: Atraumatic. Nose: Edematous nasal turbinates and bilateral maxillary guarding.  Mouth/Throat: Mucous membranes are moist.  Oropharynx non-erythematous.  Postnasal drainage. Neck: No stridor.   Hematological/Lymphatic/Immunilogical: No cervical lymphadenopathy. Cardiovascular: Normal rate, regular rhythm. Grossly normal heart sounds.  Good peripheral circulation. Respiratory: Normal respiratory effort.  No retractions. Lungs CTAB. Gastrointestinal: Soft and nontender. No distention. No abdominal bruits. No CVA tenderness. Musculoskeletal: No lower extremity tenderness nor edema.  No joint effusions. Neurologic:  Normal speech and language. No gross focal neurologic deficits are appreciated. No gait instability. Skin:  Skin is warm, dry and intact. No rash noted. Psychiatric: Mood and affect are normal. Speech and behavior are normal.  ____________________________________________   LABS (all labs ordered are listed, but only abnormal results are displayed)  Labs Reviewed  INFLUENZA PANEL BY PCR (TYPE A & B) - Abnormal; Notable for the following components:      Result Value   Influenza A By PCR POSITIVE (*)    All other components within normal limits   ____________________________________________  EKG   ____________________________________________  RADIOLOGY  ED MD interpretation:    Official radiology report(s): No  results found.  ____________________________________________   PROCEDURES  Procedure(s) performed: None  Procedures  Critical Care performed: No  ____________________________________________   INITIAL IMPRESSION / ASSESSMENT AND PLAN / ED COURSE  As part of my medical decision making, I reviewed the following data within the electronic MEDICAL RECORD NUMBER    Viral illness secondary to influenza A.  Mother given discharge care instruction work note.  Advised take medication as directed follow-up with PCP if condition persists.      ____________________________________________   FINAL CLINICAL IMPRESSION(S) / ED DIAGNOSES  Final diagnoses:  Influenza     ED Discharge Orders        Ordered    oseltamivir (TAMIFLU) 75 MG capsule  2 times daily     02/12/18 1153       Note:  This document was prepared using Dragon voice recognition software and may include unintentional dictation errors.    Joni Reining, PA-C 02/12/18 1155    Sharman Cheek, MD 02/12/18 [phone]

## 2018-02-12 NOTE — Discharge Instructions (Signed)
Continue Tylenol ibuprofen for body aches and fever.

## 2018-02-12 NOTE — ED Notes (Signed)
See triage note  States she developed some body aches and sore throat on Monday  Denies any fever

## 2018-02-12 NOTE — ED Triage Notes (Signed)
Pt c/o cough with sore throat and bodyaches since Monday..Regina Chandler

## 2018-02-13 ENCOUNTER — Encounter: Payer: Self-pay | Admitting: Physical Therapy

## 2018-02-16 ENCOUNTER — Ambulatory Visit (INDEPENDENT_AMBULATORY_CARE_PROVIDER_SITE_OTHER): Payer: Medicaid Other | Admitting: Certified Nurse Midwife

## 2018-02-16 ENCOUNTER — Ambulatory Visit (INDEPENDENT_AMBULATORY_CARE_PROVIDER_SITE_OTHER): Payer: Medicaid Other

## 2018-02-16 ENCOUNTER — Encounter: Payer: Medicaid Other | Admitting: Obstetrics & Gynecology

## 2018-02-16 VITALS — BP 122/78 | Wt 305.0 lb

## 2018-02-16 DIAGNOSIS — B181 Chronic viral hepatitis B without delta-agent: Secondary | ICD-10-CM

## 2018-02-16 DIAGNOSIS — O0990 Supervision of high risk pregnancy, unspecified, unspecified trimester: Secondary | ICD-10-CM

## 2018-02-16 DIAGNOSIS — O98419 Viral hepatitis complicating pregnancy, unspecified trimester: Secondary | ICD-10-CM

## 2018-02-16 DIAGNOSIS — O219 Vomiting of pregnancy, unspecified: Secondary | ICD-10-CM

## 2018-02-16 DIAGNOSIS — Z6841 Body Mass Index (BMI) 40.0 and over, adult: Secondary | ICD-10-CM

## 2018-02-16 DIAGNOSIS — IMO0002 Reserved for concepts with insufficient information to code with codable children: Secondary | ICD-10-CM

## 2018-02-16 DIAGNOSIS — Z0489 Encounter for examination and observation for other specified reasons: Secondary | ICD-10-CM | POA: Diagnosis not present

## 2018-02-16 DIAGNOSIS — O99212 Obesity complicating pregnancy, second trimester: Secondary | ICD-10-CM

## 2018-02-16 DIAGNOSIS — Z8619 Personal history of other infectious and parasitic diseases: Secondary | ICD-10-CM

## 2018-02-16 DIAGNOSIS — Z3A24 24 weeks gestation of pregnancy: Secondary | ICD-10-CM

## 2018-02-16 DIAGNOSIS — O10919 Unspecified pre-existing hypertension complicating pregnancy, unspecified trimester: Secondary | ICD-10-CM

## 2018-02-16 MED ORDER — ONDANSETRON 8 MG PO TBDP: 8.0000 mg | ORAL_TABLET | Freq: Three times a day (TID) | ORAL | 2 refills | Status: DC | PRN

## 2018-02-16 MED ORDER — RANITIDINE HCL 150 MG PO TABS: 150.0000 mg | ORAL_TABLET | Freq: Two times a day (BID) | ORAL | 2 refills | Status: DC

## 2018-02-16 NOTE — Progress Notes (Signed)
[facility]18 currently taking tylenol. Denies recent fever, mainly body aches.  Requests rx for additional phenergan and diclegis to try.

## 2018-02-17 LAB — RPR+RH+ABO+RUB AB+AB SCR+CB...
Antibody Screen: NEGATIVE
HIV Screen 4th Generation wRfx: NONREACTIVE
Hematocrit: 34.4 % (ref 34.0–46.6)
Hemoglobin: 11 g/dL — ABNORMAL LOW (ref 11.1–15.9)
MCH: 29.3 pg (ref 26.6–33.0)
MCHC: 32 g/dL (ref 31.5–35.7)
MCV: 92 fL (ref 79–97)
Platelets: 234 10*3/uL (ref 150–379)
RBC: 3.75 x10E6/uL — ABNORMAL LOW (ref 3.77–5.28)
RDW: 13.8 % (ref 12.3–15.4)
RPR Ser Ql: NONREACTIVE
Rh Factor: POSITIVE
Rubella Antibodies, IGG: 4.76 index (ref >0.99)
Varicella zoster IgG: 1896 index (ref >165)
WBC: 5.7 10*3/uL (ref 3.4–10.8)

## 2018-02-17 LAB — HEMOGLOBIN A1C
Est. average glucose Bld gHb Est-mCnc: 97 mg/dL
Hgb A1c MFr Bld: 5 % (ref 4.8–5.6)

## 2018-02-17 LAB — COMPREHENSIVE METABOLIC PANEL
ALT: 23 IU/L (ref 0–32)
AST: 25 IU/L (ref 0–40)
Albumin/Globulin Ratio: 1.1 — ABNORMAL LOW (ref 1.2–2.2)
Albumin: 3.5 g/dL (ref 3.5–5.5)
Alkaline Phosphatase: 82 IU/L (ref 39–117)
BUN/Creatinine Ratio: 10 (ref 9–23)
BUN: 5 mg/dL — ABNORMAL LOW (ref 6–20)
Bilirubin Total: 0.4 mg/dL (ref 0.0–1.2)
CO2: 22 mmol/L (ref 20–29)
Calcium: 8.8 mg/dL (ref 8.7–10.2)
Chloride: 104 mmol/L (ref 96–106)
Creatinine, Ser: 0.5 mg/dL — ABNORMAL LOW (ref 0.57–1.00)
GFR calc Af Amer: 151 mL/min/{1.73_m2} (ref >59)
GFR calc non Af Amer: 131 mL/min/{1.73_m2} (ref >59)
Globulin, Total: 3.3 g/dL (ref 1.5–4.5)
Glucose: 89 mg/dL (ref 65–99)
Potassium: 4.1 mmol/L (ref 3.5–5.2)
Sodium: 137 mmol/L (ref 134–144)
Total Protein: 6.8 g/dL (ref 6.0–8.5)

## 2018-02-17 LAB — HEMOGLOBINOPATHY EVALUATION
HGB C: 0 %
HGB S: 0 %
HGB VARIANT: 0 %
Hemoglobin A2 Quantitation: 2.9 % (ref 1.8–3.2)
Hemoglobin F Quantitation: 0.5 % (ref 0.0–2.0)
Hgb A: 96.6 % (ref 96.4–98.8)

## 2018-02-17 LAB — TSH: TSH: 2.85 u[IU]/mL (ref 0.450–4.500)

## 2018-02-17 LAB — HEPATITIS B SURFACE AG, CONFIRM: HBsAg Confirmation: POSITIVE — AB

## 2018-02-19 NOTE — Progress Notes (Signed)
Can you add this patient to there HROB list? Thank you

## 2018-02-19 NOTE — Progress Notes (Signed)
[facility]

## 2018-02-20 ENCOUNTER — Ambulatory Visit: Payer: Medicaid Other | Attending: Physician Assistant | Admitting: Physical Therapy

## 2018-02-20 ENCOUNTER — Ambulatory Visit: Payer: Medicaid Other | Admitting: Physical Therapy

## 2018-02-21 NOTE — Progress Notes (Signed)
This patient reported she was hep B positive at her initial prenatal, so she already knows.

## 2018-02-23 ENCOUNTER — Encounter: Payer: Self-pay | Admitting: Certified Nurse Midwife

## 2018-02-23 NOTE — Progress Notes (Addendum)
HROB at 24wk and FU anatomy scan. HROB for BMI>50, CHTN and Chronic hepatitis B Diagnosed with Influenza A on 02/12/2018 which worsened nausea and vomiting. Did take the Tamiflu. Lost 5# over the last month. Requests antiemetic (Diclegis not working well) and refill on ranitadine for heartburn/reflux  Zofran 8 mgm po q8hrs prn N/V and Ranitadine 150 mgm BID prn sent to pharmacy FU anatomy scan-completed and normal Needs NOB labs, CMP, hemoglobin A1C (early GTT not done) and TSH today.  Anesthesia consult for BMI>50. ordered RTO 4 weeks for US for growth, 28 week labs, HBeAG, Hept C antibody, and ROB Farrel Connersolleen Kenyata Napier, CNM

## 2018-02-27 ENCOUNTER — Telehealth: Payer: Self-pay | Admitting: Certified Nurse Midwife

## 2018-02-27 ENCOUNTER — Ambulatory Visit: Payer: Medicaid Other | Admitting: Physical Therapy

## 2018-02-27 ENCOUNTER — Ambulatory Visit: Payer: Medicaid Other | Admitting: Dietician

## 2018-02-27 NOTE — Telephone Encounter (Signed)
[facility] returned the call and [facility]

## 2018-02-27 NOTE — Telephone Encounter (Signed)
Per Morrie SheldonAshley @ Pre-admit Testing, she attempted to reach the patient and l/m but hasn't heard back. Patient needs to contact Al[place]ley @ [phone] to schedule the appointment. Lmtrc.

## 2018-03-06 ENCOUNTER — Encounter: Payer: Medicaid Other | Admitting: Physical Therapy

## 2018-03-11 ENCOUNTER — Inpatient Hospital Stay: Admission: RE | Admit: 2018-03-11 | Payer: Medicaid Other | Source: Ambulatory Visit

## 2018-03-16 ENCOUNTER — Ambulatory Visit (INDEPENDENT_AMBULATORY_CARE_PROVIDER_SITE_OTHER): Payer: Medicaid Other

## 2018-03-16 ENCOUNTER — Ambulatory Visit (INDEPENDENT_AMBULATORY_CARE_PROVIDER_SITE_OTHER): Payer: Medicaid Other | Admitting: Maternal Newborn

## 2018-03-16 ENCOUNTER — Encounter: Payer: Self-pay | Admitting: Maternal Newborn

## 2018-03-16 ENCOUNTER — Other Ambulatory Visit: Payer: Medicaid Other

## 2018-03-16 ENCOUNTER — Other Ambulatory Visit: Payer: Self-pay | Admitting: Maternal Newborn

## 2018-03-16 VITALS — BP 132/80 | Wt 304.0 lb

## 2018-03-16 DIAGNOSIS — O0990 Supervision of high risk pregnancy, unspecified, unspecified trimester: Secondary | ICD-10-CM | POA: Diagnosis not present

## 2018-03-16 DIAGNOSIS — O99212 Obesity complicating pregnancy, second trimester: Secondary | ICD-10-CM

## 2018-03-16 DIAGNOSIS — O26893 Other specified pregnancy related conditions, third trimester: Secondary | ICD-10-CM

## 2018-03-16 DIAGNOSIS — R51 Headache: Secondary | ICD-10-CM

## 2018-03-16 DIAGNOSIS — Z6841 Body Mass Index (BMI) 40.0 and over, adult: Secondary | ICD-10-CM

## 2018-03-16 DIAGNOSIS — B181 Chronic viral hepatitis B without delta-agent: Secondary | ICD-10-CM

## 2018-03-16 DIAGNOSIS — O98419 Viral hepatitis complicating pregnancy, unspecified trimester: Principal | ICD-10-CM

## 2018-03-16 DIAGNOSIS — Z3A28 28 weeks gestation of pregnancy: Secondary | ICD-10-CM

## 2018-03-16 MED ORDER — BUTALBITAL-APAP-CAFFEINE 50-325-40 MG PO CAPS: 1.0000 | ORAL_CAPSULE | Freq: Four times a day (QID) | ORAL | 0 refills | Status: DC | PRN

## 2018-03-16 NOTE — Patient Instructions (Signed)
Third Trimester of Pregnancy The third trimester is from week 28 through week 40 (months 7 through 9). The third trimester is a time when the unborn baby (fetus) is growing rapidly. At the end of the ninth month, the fetus is about 20 inches in length and weighs 6-10 pounds. Body changes during your third trimester Your body will continue to go through many changes during pregnancy. The changes vary from woman to woman. During the third trimester:  Your weight will continue to increase. You can expect to gain 25-35 pounds (11-16 kg) by the end of the pregnancy.  You may begin to get stretch marks on your hips, abdomen, and breasts.  You may urinate more often because the fetus is moving lower into your pelvis and pressing on your bladder.  You may develop or continue to have heartburn. This is caused by increased hormones that slow down muscles in the digestive tract.  You may develop or continue to have constipation because increased hormones slow digestion and cause the muscles that push waste through your intestines to relax.  You may develop hemorrhoids. These are swollen veins (varicose veins) in the rectum that can itch or be painful.  You may develop swollen, bulging veins (varicose veins) in your legs.  You may have increased body aches in the pelvis, back, or thighs. This is due to weight gain and increased hormones that are relaxing your joints.  You may have changes in your hair. These can include thickening of your hair, rapid growth, and changes in texture. Some women also have hair loss during or after pregnancy, or hair that feels dry or thin. Your hair will most likely return to normal after your baby is born.  Your breasts will continue to grow and they will continue to become tender. A yellow fluid (colostrum) may leak from your breasts. This is the first milk you are producing for your baby.  Your belly button may stick out.  You may notice more swelling in your hands,  face, or ankles.  You may have increased tingling or numbness in your hands, arms, and legs. The skin on your belly may also feel numb.  You may feel short of breath because of your expanding uterus.  You may have more problems sleeping. This can be caused by the size of your belly, increased need to urinate, and an increase in your body's metabolism.  You may notice the fetus "dropping," or moving lower in your abdomen (lightening).  You may have increased vaginal discharge.  You may notice your joints feel loose and you may have pain around your pelvic bone.  What to expect at prenatal visits You will have prenatal exams every 2 weeks until week 36. Then you will have weekly prenatal exams. During a routine prenatal visit:  You will be weighed to make sure you and the baby are growing normally.  Your blood pressure will be taken.  Your abdomen will be measured to track your baby's growth.  The fetal heartbeat will be listened to.  Any test results from the previous visit will be discussed.  You may have a cervical check near your due date to see if your cervix has softened or thinned (effaced).  You will be tested for Group B streptococcus. This happens between 35 and 37 weeks.  Your health care provider may ask you:  What your birth plan is.  How you are feeling.  If you are feeling the baby move.  If you have had   any abnormal symptoms, such as leaking fluid, bleeding, severe headaches, or abdominal cramping.  If you are using any tobacco products, including cigarettes, chewing tobacco, and electronic cigarettes.  If you have any questions.  Other tests or screenings that may be performed during your third trimester include:  Blood tests that check for low iron levels (anemia).  Fetal testing to check the health, activity level, and growth of the fetus. Testing is done if you have certain medical conditions or if there are problems during the  pregnancy.  Nonstress test (NST). This test checks the health of your baby to make sure there are no signs of problems, such as the baby not getting enough oxygen. During this test, a belt is placed around your belly. The baby is made to move, and its heart rate is monitored during movement.  What is false labor? False labor is a condition in which you feel small, irregular tightenings of the muscles in the womb (contractions) that usually go away with rest, changing position, or drinking water. These are called Braxton Hicks contractions. Contractions may last for hours, days, or even weeks before true labor sets in. If contractions come at regular intervals, become more frequent, increase in intensity, or become painful, you should see your health care provider. What are the signs of labor?  Abdominal cramps.  Regular contractions that start at 10 minutes apart and become stronger and more frequent with time.  Contractions that start on the top of the uterus and spread down to the lower abdomen and back.  Increased pelvic pressure and dull back pain.  A watery or bloody mucus discharge that comes from the vagina.  Leaking of amniotic fluid. This is also known as your "water breaking." It could be a slow trickle or a gush. Let your health care provider know if it has a color or strange odor. If you have any of these signs, call your health care provider right away, even if it is before your due date. Follow these instructions at home: Medicines  Follow your health care provider's instructions regarding medicine use. Specific medicines may be either safe or unsafe to take during pregnancy.  Take a prenatal vitamin that contains at least 600 micrograms (mcg) of folic acid.  If you develop constipation, try taking a stool softener if your health care provider approves. Eating and drinking  Eat a balanced diet that includes fresh fruits and vegetables, whole grains, good sources of protein  such as meat, eggs, or tofu, and low-fat dairy. Your health care provider will help you determine the amount of weight gain that is right for you.  Avoid raw meat and uncooked cheese. These carry germs that can cause birth defects in the baby.  If you have low calcium intake from food, talk to your health care provider about whether you should take a daily calcium supplement.  Eat four or five small meals rather than three large meals a day.  Limit foods that are high in fat and processed sugars, such as fried and sweet foods.  To prevent constipation: ? Drink enough fluid to keep your urine clear or pale yellow. ? Eat foods that are high in fiber, such as fresh fruits and vegetables, whole grains, and beans. Activity  Exercise only as directed by your health care provider. Most women can continue their usual exercise routine during pregnancy. Try to exercise for 30 minutes at least 5 days a week. Stop exercising if you experience uterine contractions.  Avoid heavy   lifting.  Do not exercise in extreme heat or humidity, or at high altitudes.  Wear low-heel, comfortable shoes.  Practice good posture.  You may continue to have sex unless your health care provider tells you otherwise. Relieving pain and discomfort  Take frequent breaks and rest with your legs elevated if you have leg cramps or low back pain.  Take warm sitz baths to soothe any pain or discomfort caused by hemorrhoids. Use hemorrhoid cream if your health care provider approves.  Wear a good support bra to prevent discomfort from breast tenderness.  If you develop varicose veins: ? Wear support pantyhose or compression stockings as told by your healthcare provider. ? Elevate your feet for 15 minutes, 3-4 times a day. Prenatal care  Write down your questions. Take them to your prenatal visits.  Keep all your prenatal visits as told by your health care provider. This is important. Safety  Wear your seat belt at  all times when driving.  Make a list of emergency phone numbers, including numbers for family, friends, the hospital, and police and fire departments. General instructions  Avoid cat litter boxes and soil used by cats. These carry germs that can cause birth defects in the baby. If you have a cat, ask someone to clean the litter box for you.  Do not travel far distances unless it is absolutely necessary and only with the approval of your health care provider.  Do not use hot tubs, steam rooms, or saunas.  Do not drink alcohol.  Do not use any products that contain nicotine or tobacco, such as cigarettes and e-cigarettes. If you need help quitting, ask your health care provider.  Do not use any medicinal herbs or unprescribed drugs. These chemicals affect the formation and growth of the baby.  Do not douche or use tampons or scented sanitary pads.  Do not cross your legs for long periods of time.  To prepare for the arrival of your baby: ? Take prenatal classes to understand, practice, and ask questions about labor and delivery. ? Make a trial run to the hospital. ? Visit the hospital and tour the maternity area. ? Arrange for maternity or paternity leave through employers. ? Arrange for family and friends to take care of pets while you are in the hospital. ? Purchase a rear-facing car seat and make sure you know how to install it in your car. ? Pack your hospital bag. ? Prepare the baby's nursery. Make sure to remove all pillows and stuffed animals from the baby's crib to prevent suffocation.  Visit your dentist if you have not gone during your pregnancy. Use a soft toothbrush to brush your teeth and be gentle when you floss. Contact a health care provider if:  You are unsure if you are in labor or if your water has broken.  You become dizzy.  You have mild pelvic cramps, pelvic pressure, or nagging pain in your abdominal area.  You have lower back pain.  You have persistent  nausea, vomiting, or diarrhea.  You have an unusual or bad smelling vaginal discharge.  You have pain when you urinate. Get help right away if:  Your water breaks before 37 weeks.  You have regular contractions less than 5 minutes apart before 37 weeks.  You have a fever.  You are leaking fluid from your vagina.  You have spotting or bleeding from your vagina.  You have severe abdominal pain or cramping.  You have rapid weight loss or weight gain.    You have shortness of breath with chest pain.  You notice sudden or extreme swelling of your face, hands, ankles, feet, or legs.  Your baby makes fewer than 10 movements in 2 hours.  You have severe headaches that do not go away when you take medicine.  You have vision changes. Summary  The third trimester is from week 28 through week 40, months 7 through 9. The third trimester is a time when the unborn baby (fetus) is growing rapidly.  During the third trimester, your discomfort may increase as you and your baby continue to gain weight. You may have abdominal, leg, and back pain, sleeping problems, and an increased need to urinate.  During the third trimester your breasts will keep growing and they will continue to become tender. A yellow fluid (colostrum) may leak from your breasts. This is the first milk you are producing for your baby.  False labor is a condition in which you feel small, irregular tightenings of the muscles in the womb (contractions) that eventually go away. These are called Braxton Hicks contractions. Contractions may last for hours, days, or even weeks before true labor sets in.  Signs of labor can include: abdominal cramps; regular contractions that start at 10 minutes apart and become stronger and more frequent with time; watery or bloody mucus discharge that comes from the vagina; increased pelvic pressure and dull back pain; and leaking of amniotic fluid. This information is not intended to replace advice  given to you by your health care provider. Make sure you discuss any questions you have with your health care provider. Document Released: 11/19/2001 Document Revised: 05/02/2016 Document Reviewed: 01/26/2013 Elsevier Interactive Patient Education  2017 [employer]  

## 2018-03-16 NOTE — Progress Notes (Signed)
Routine Prenatal Care Visit  Subjective  Regina Chandler is a 27 y.o. [phone] at [redacted]w[redacted]d being seen today for ongoing prenatal care.  She is currently monitored for the following issues for this high-risk pregnancy and has Nausea and vomiting of pregnancy, antepartum; Obesity affecting pregnancy in second trimester; Headache in pregnancy, antepartum, third trimester; Supervision of high risk pregnancy, antepartum; Back pain affecting pregnancy in second trimester; Chronic hepatitis B affecting antepartum care of mother [facility]); BMI 45.0-49.9, adult (HCC); Obesity in pregnancy, antepartum; Chronic hypertension during pregnancy, antepartum; and Influenza A on their problem list.  ----------------------------------------------------------------------------------- Patient reports backache, headache and round ligament and leg pain. Lower extremity edema when standing/working.   Contractions: Not present. Vag. Bleeding: None.  Movement: Present. Denies leaking of fluid.  ----------------------------------------------------------------------------------- The following portions of the patient's history were reviewed and updated as appropriate: allergies, current medications, past family history, past medical history, past social history, past surgical history and problem list. Problem list updated.   Objective  Blood pressure 132/80, weight (!) 304 lb (137.9 kg), last menstrual period 09/01/2017. Pregravid weight 307 lb (139.3 kg) Total Weight Gain -3 lb (-1.361 kg) Urinalysis: Urine Protein: Negative Urine Glucose: Negative  Fetal Status: Fetal Heart Rate (bpm): 134 Fundal Height: 32 cm Movement: Present     General:  Alert, oriented and cooperative. Patient is in no acute distress.  Skin: Skin is warm and dry. No rash noted.   Cardiovascular: Normal heart rate noted  Respiratory: Normal respiratory effort, no problems with respiration noted  Abdomen: Soft, gravid, appropriate for gestational age.  Pain/Pressure: Present     Pelvic:  Cervical exam deferred        Extremities: Normal range of motion.     Mental Status: Normal mood and affect. Normal behavior. Normal judgment and thought content.     Assessment   27 y.o. [id number] at [redacted]w[redacted]d, EDD 06/08/2018 by Last Menstrual Period presenting for routine prenatal visit.  Plan   pregnancy #6 Problems (from 12/19/17 to present)    Problem Noted Resolved   Obesity in pregnancy, antepartum 12/19/2017 by Natale Milch, MD No   Overview Addendum 12/19/2017  4:55 PM by Natale Milch, MD    BMI >=40 [ ]  early 1h gtt -  [ ]  u/s for dating [ ]   Arly.Keller ] nutritional goals Arly.Keller ] folic acid 1mg  Arly.Keller ] bASA (>12 weeks) Arly.Keller ] consider nutrition consult [ ]  consider maternal EKG 1st trimester [ ]  Growth u/s 28 [ ] , 32 [ ] , 36 weeks [ ]  [ ]  NST/AFI weekly 36+ weeks (36[] , 37[] , 38[] , 39[] , 40[] ) [ ]  IOL by 41 weeks (scheduled, prn [] )       Chronic hypertension during pregnancy, antepartum 12/19/2017 by Natale Milch, MD No   Overview Signed 12/19/2017  4:52 PM by Natale Milch, MD    Arly.Keller ] Aspirin 81 mg daily after 12 weeks; discontinue after 36 weeks [ ]  baseline labs with CBC, CMP, urine protein/creatinine ratio [ ]  no BP meds unless BPs become elevated [ ]  ultrasound for growth at 28, 32, 36 weeks [ ]  Aspirin 81 mg daily after 12 weeks; discontinue after 36 weeks [ ]  Baseline EKG   Current antihypertensives:  None   Baseline and surveillance labs (pulled in from [facility], refresh links as needed)  Lab Results  Component Value Date   PLT 201 12/03/2017   CREATININE 0.47 12/03/2017   AST 18 12/03/2017   ALT 19 12/03/2017  PROTCRRATIO 0.13 11/24/2016    Antenatal Testing CHTN - O10.919  Group I  BP < 140/90, no preeclampsia, AGA,  nml AFV, +/- meds    Group II BP > 140/90, on meds, no preeclampsia, AGA, nml AFV  20-28-34-38  20-24-28-32-35-38  32//2 x wk  28//BPP wkly then 32//2 x wk  40 no meds; 39  meds  PRN or 37  Pre-eclampsia  GHTN - O13.9/Preeclampsia without severe features  - O14.00   Preeclampsia with severe features - O14.10  Q 3-4wks  Q 2 wks  28//BPP wkly then 32//2 x wk  Inpatient  37  PRN or 34         Obesity affecting pregnancy in second trimester 10/18/2016 by Nadara MustardHarris, Robert P, MD No      Ultrasound today shows growth at 43rd% tile.  Discussed comfort measures for backache and edema: pregnancy support band and compression stockings. Rx for Fioricet for frequent headaches.  Gestational age appropriate obstetric precautions were reviewed with the patient. Please refer to After Visit Summary for other counseling recommendations.   Return in about 2 weeks (around 03/30/2018) for ROB.  Marcelyn BruinsJacelyn Tsuyako Jolley, CNM 03/16/2018  10:31 AM

## 2018-03-17 LAB — 28 WEEK RH+PANEL
Basophils Absolute: 0 10*3/uL (ref 0.0–0.2)
Basos: 0 %
EOS (ABSOLUTE): 0 10*3/uL (ref 0.0–0.4)
Eos: 1 %
Gestational Diabetes Screen: 99 mg/dL (ref 65–139)
HIV Screen 4th Generation wRfx: NONREACTIVE
Hematocrit: 30.8 % — ABNORMAL LOW (ref 34.0–46.6)
Hemoglobin: 10.1 g/dL — ABNORMAL LOW (ref 11.1–15.9)
Immature Grans (Abs): 0 10*3/uL (ref 0.0–0.1)
Immature Granulocytes: 0 %
Lymphocytes Absolute: 1.3 10*3/uL (ref 0.7–3.1)
Lymphs: 22 %
MCH: 28.9 pg (ref 26.6–33.0)
MCHC: 32.8 g/dL (ref 31.5–35.7)
MCV: 88 fL (ref 79–97)
Monocytes Absolute: 0.4 10*3/uL (ref 0.1–0.9)
Monocytes: 8 %
Neutrophils Absolute: 3.9 10*3/uL (ref 1.4–7.0)
Neutrophils: 69 %
Platelets: 211 10*3/uL (ref 150–379)
RBC: 3.49 x10E6/uL — ABNORMAL LOW (ref 3.77–5.28)
RDW: 14.2 % (ref 12.3–15.4)
RPR Ser Ql: NONREACTIVE
WBC: 5.7 10*3/uL (ref 3.4–10.8)

## 2018-03-17 LAB — HEPATITIS C ANTIBODY (REFLEX): HCV Ab: <0.1 s/co ratio (ref 0.0–0.9)

## 2018-03-17 LAB — HEPATITIS B E ANTIGEN: Hep B E Ag: NEGATIVE

## 2018-03-17 LAB — HCV COMMENT:

## 2018-03-18 ENCOUNTER — Encounter: Payer: Medicaid Other | Admitting: Physical Therapy

## 2018-03-19 ENCOUNTER — Inpatient Hospital Stay: Admission: RE | Admit: 2018-03-19 | Payer: Medicaid Other | Source: Ambulatory Visit

## 2018-03-20 ENCOUNTER — Encounter: Payer: Medicaid Other | Admitting: Physical Therapy

## 2018-03-20 ENCOUNTER — Encounter
Admission: RE | Admit: 2018-03-20 | Discharge: 2018-03-20 | Disposition: A | Discharge status: Home / Self Care | Payer: Medicaid Other | Source: Ambulatory Visit | Attending: Certified Nurse Midwife | Admitting: Certified Nurse Midwife

## 2018-03-20 ENCOUNTER — Encounter: Payer: Self-pay | Admitting: Anesthesiology

## 2018-03-20 NOTE — Anesthesia Pain Management Evaluation Note (Signed)
  Anesthesia Pain Consult Note  Patient: Regina FowlerAnastasia J Gettel, 27 y.o., female  Consult Requested by: Farrel ConnersGutierrez, Colleen, CNM  Reason for Consult: BMI consult  Level of Consciousness: alert  Pain: none   Last Vitals: There were no vitals filed for this visit.  Plan: Epidural infusion for pain control.  Risks of wet tap, epidural hematoma and spinal cord injury explained to:   Consent:  Allergies  Allergen Reactions  . Penicillins Swelling    Has patient had a PCN reaction causing immediate rash, facial/tongue/throat swelling, SOB or lightheadedness with hypotension: No Has patient had a PCN reaction causing severe rash involving mucus membranes or skin necrosis: No Has patient had a PCN reaction that required hospitalization No Has patient had a PCN reaction occurring within the last 10 years: No If all of the above answers are "NO", then may proceed with Cephalosporin use.    Physical exam: PULM normal  CARDIO Heart sounds are normal.  Regular rate and rhythm without murmur, gallop or rub.  OTHER Morbidly obese patient with a BMI of approximately 49.2.  The back is reasonable for an epidural and indeed the patient did have an epidural on her previous exam.   I have reviewed the patient's medications listed below. . docusate sodium  100 mg Oral BID      Past Medical History:  Diagnosis Date  . Anemia   . Anxiety   . Bipolar 1 disorder (HCC)   . Depression   . GERD (gastroesophageal reflux disease)   . Headache   . Hepatitis B affecting pregnancy 2007   No current treatment required, titers show up negative   Past Surgical History:  Procedure Laterality Date  . NO PAST SURGERIES    . NO PAST SURGERIES      reports that she has quit smoking. She has never used smokeless tobacco. She reports that she does not drink alcohol or use drugs.    The patient is borderline for her BMI at 49.2 and was instructed that she must adhere to a reasonable diet.  She understands  that she if she bypasses the 50 BMI threshold, that she may very well be sent to a major medical center for high risk care.  Currently, the patient is 29 weeks per patient and she is to see the nurse midwife next week.  I have reached out to Farrel Connersolleen Gutierrez and advised her to monitor the patient's weight closely and if she breeches the BMI threshold and is appearing to still be gaining weight then the patient should be transferred to a high risk center [facility],Clanton Emanuelson 03/20/2018

## 2018-03-30 ENCOUNTER — Encounter: Payer: Medicaid Other | Admitting: Certified Nurse Midwife

## 2018-04-01 ENCOUNTER — Encounter: Payer: Medicaid Other | Admitting: Advanced Practice Midwife

## 2018-04-03 ENCOUNTER — Observation Stay
Admission: EM | Admit: 2018-04-03 | Discharge: 2018-04-04 | Disposition: A | Discharge status: Home / Self Care | Payer: Medicaid Other | Attending: Obstetrics and Gynecology | Admitting: Obstetrics and Gynecology

## 2018-04-03 ENCOUNTER — Ambulatory Visit (INDEPENDENT_AMBULATORY_CARE_PROVIDER_SITE_OTHER): Payer: 59 | Admitting: Advanced Practice Midwife

## 2018-04-03 ENCOUNTER — Other Ambulatory Visit: Payer: Self-pay

## 2018-04-03 ENCOUNTER — Encounter: Payer: Self-pay | Admitting: Advanced Practice Midwife

## 2018-04-03 ENCOUNTER — Encounter: Payer: Self-pay | Admitting: *Deleted

## 2018-04-03 VITALS — BP 142/98 | Wt 312.0 lb

## 2018-04-03 DIAGNOSIS — O99212 Obesity complicating pregnancy, second trimester: Secondary | ICD-10-CM

## 2018-04-03 DIAGNOSIS — K219 Gastro-esophageal reflux disease without esophagitis: Secondary | ICD-10-CM | POA: Diagnosis not present

## 2018-04-03 DIAGNOSIS — Z3A30 30 weeks gestation of pregnancy: Secondary | ICD-10-CM

## 2018-04-03 DIAGNOSIS — O99613 Diseases of the digestive system complicating pregnancy, third trimester: Secondary | ICD-10-CM | POA: Diagnosis not present

## 2018-04-03 DIAGNOSIS — O99213 Obesity complicating pregnancy, third trimester: Secondary | ICD-10-CM | POA: Insufficient documentation

## 2018-04-03 DIAGNOSIS — R51 Headache: Secondary | ICD-10-CM | POA: Diagnosis not present

## 2018-04-03 DIAGNOSIS — Z23 Encounter for immunization: Secondary | ICD-10-CM | POA: Diagnosis not present

## 2018-04-03 DIAGNOSIS — O0990 Supervision of high risk pregnancy, unspecified, unspecified trimester: Secondary | ICD-10-CM

## 2018-04-03 DIAGNOSIS — O26893 Other specified pregnancy related conditions, third trimester: Secondary | ICD-10-CM | POA: Diagnosis not present

## 2018-04-03 DIAGNOSIS — O99210 Obesity complicating pregnancy, unspecified trimester: Secondary | ICD-10-CM

## 2018-04-03 DIAGNOSIS — Z3A34 34 weeks gestation of pregnancy: Secondary | ICD-10-CM | POA: Insufficient documentation

## 2018-04-03 DIAGNOSIS — Z87891 Personal history of nicotine dependence: Secondary | ICD-10-CM | POA: Insufficient documentation

## 2018-04-03 DIAGNOSIS — O10919 Unspecified pre-existing hypertension complicating pregnancy, unspecified trimester: Secondary | ICD-10-CM

## 2018-04-03 DIAGNOSIS — Z79899 Other long term (current) drug therapy: Secondary | ICD-10-CM | POA: Diagnosis not present

## 2018-04-03 DIAGNOSIS — O163 Unspecified maternal hypertension, third trimester: Principal | ICD-10-CM | POA: Diagnosis present

## 2018-04-03 DIAGNOSIS — Z6841 Body Mass Index (BMI) 40.0 and over, adult: Secondary | ICD-10-CM

## 2018-04-03 LAB — COMPREHENSIVE METABOLIC PANEL
ALT: 20 U/L (ref 14–54)
AST: 21 U/L (ref 15–41)
Albumin: 3 g/dL — ABNORMAL LOW (ref 3.5–5.0)
Alkaline Phosphatase: 80 U/L (ref 38–126)
Anion gap: 6 (ref 5–15)
BUN: 7 mg/dL (ref 6–20)
CO2: 24 mmol/L (ref 22–32)
Calcium: 8.1 mg/dL — ABNORMAL LOW (ref 8.9–10.3)
Chloride: 105 mmol/L (ref 101–111)
Creatinine, Ser: 0.47 mg/dL (ref 0.44–1.00)
GFR calc Af Amer: >60 mL/min (ref >60)
GFR calc non Af Amer: >60 mL/min (ref >60)
Glucose, Bld: 103 mg/dL — ABNORMAL HIGH (ref 65–99)
Potassium: 3.4 mmol/L — ABNORMAL LOW (ref 3.5–5.1)
Sodium: 135 mmol/L (ref 135–145)
Total Bilirubin: 0.6 mg/dL (ref 0.3–1.2)
Total Protein: 6.8 g/dL (ref 6.5–8.1)

## 2018-04-03 LAB — PROTEIN / CREATININE RATIO, URINE
Creatinine, Urine: 112 mg/dL
Protein Creatinine Ratio: 0.13 mg/mg{Cre} (ref 0.00–0.15)
Total Protein, Urine: 14 mg/dL

## 2018-04-03 LAB — CBC
HCT: 29.4 % — ABNORMAL LOW (ref 35.0–47.0)
Hemoglobin: 9.8 g/dL — ABNORMAL LOW (ref 12.0–16.0)
MCH: 29.8 pg (ref 26.0–34.0)
MCHC: 33.5 g/dL (ref 32.0–36.0)
MCV: 89 fL (ref 80.0–100.0)
Platelets: 202 10*3/uL (ref 150–440)
RBC: 3.3 MIL/uL — ABNORMAL LOW (ref 3.80–5.20)
RDW: 14.3 % (ref 11.5–14.5)
WBC: 6.8 10*3/uL (ref 3.6–11.0)

## 2018-04-03 LAB — TYPE AND SCREEN
ABO/RH(D): O POS
Antibody Screen: NEGATIVE

## 2018-04-03 MED ORDER — PROCHLORPERAZINE MALEATE 10 MG PO TABS
10.0000 mg | ORAL_TABLET | ORAL | Status: AC
  Administered 2018-04-03: 10 mg via ORAL
  Filled 2018-04-03: qty 1

## 2018-04-03 MED ORDER — DIPHENHYDRAMINE HCL 25 MG PO CAPS
50.0000 mg | ORAL_CAPSULE | ORAL | Status: AC
  Administered 2018-04-03: 50 mg via ORAL
  Filled 2018-04-03: qty 2

## 2018-04-03 MED ORDER — BUTALBITAL-APAP-CAFFEINE 50-325-40 MG PO TABS
1.0000 | ORAL_TABLET | Freq: Four times a day (QID) | ORAL | Status: DC | PRN
  Administered 2018-04-03: 1 via ORAL
  Filled 2018-04-03: qty 1

## 2018-04-03 MED ORDER — HYDROCODONE-ACETAMINOPHEN 5-325 MG PO TABS
1.0000 | ORAL_TABLET | Freq: Once | ORAL | Status: AC
  Administered 2018-04-03: 1 via ORAL
  Filled 2018-04-03: qty 1

## 2018-04-03 NOTE — Patient Instructions (Signed)
Third Trimester of Pregnancy The third trimester is from week 28 through week 40 (months 7 through 9). The third trimester is a time when the unborn baby (fetus) is growing rapidly. At the end of the ninth month, the fetus is about 20 inches in length and weighs 6-10 pounds. Body changes during your third trimester Your body will continue to go through many changes during pregnancy. The changes vary from woman to woman. During the third trimester:  Your weight will continue to increase. You can expect to gain 25-35 pounds (11-16 kg) by the end of the pregnancy.  You may begin to get stretch marks on your hips, abdomen, and breasts.  You may urinate more often because the fetus is moving lower into your pelvis and pressing on your bladder.  You may develop or continue to have heartburn. This is caused by increased hormones that slow down muscles in the digestive tract.  You may develop or continue to have constipation because increased hormones slow digestion and cause the muscles that push waste through your intestines to relax.  You may develop hemorrhoids. These are swollen veins (varicose veins) in the rectum that can itch or be painful.  You may develop swollen, bulging veins (varicose veins) in your legs.  You may have increased body aches in the pelvis, back, or thighs. This is due to weight gain and increased hormones that are relaxing your joints.  You may have changes in your hair. These can include thickening of your hair, rapid growth, and changes in texture. Some women also have hair loss during or after pregnancy, or hair that feels dry or thin. Your hair will most likely return to normal after your baby is born.  Your breasts will continue to grow and they will continue to become tender. A yellow fluid (colostrum) may leak from your breasts. This is the first milk you are producing for your baby.  Your belly button may stick out.  You may notice more swelling in your hands,  face, or ankles.  You may have increased tingling or numbness in your hands, arms, and legs. The skin on your belly may also feel numb.  You may feel short of breath because of your expanding uterus.  You may have more problems sleeping. This can be caused by the size of your belly, increased need to urinate, and an increase in your body's metabolism.  You may notice the fetus "dropping," or moving lower in your abdomen (lightening).  You may have increased vaginal discharge.  You may notice your joints feel loose and you may have pain around your pelvic bone.  What to expect at prenatal visits You will have prenatal exams every 2 weeks until week 36. Then you will have weekly prenatal exams. During a routine prenatal visit:  You will be weighed to make sure you and the baby are growing normally.  Your blood pressure will be taken.  Your abdomen will be measured to track your baby's growth.  The fetal heartbeat will be listened to.  Any test results from the previous visit will be discussed.  You may have a cervical check near your due date to see if your cervix has softened or thinned (effaced).  You will be tested for Group B streptococcus. This happens between 35 and 37 weeks.  Your health care provider may ask you:  What your birth plan is.  How you are feeling.  If you are feeling the baby move.  If you have had   any abnormal symptoms, such as leaking fluid, bleeding, severe headaches, or abdominal cramping.  If you are using any tobacco products, including cigarettes, chewing tobacco, and electronic cigarettes.  If you have any questions.  Other tests or screenings that may be performed during your third trimester include:  Blood tests that check for low iron levels (anemia).  Fetal testing to check the health, activity level, and growth of the fetus. Testing is done if you have certain medical conditions or if there are problems during the  pregnancy.  Nonstress test (NST). This test checks the health of your baby to make sure there are no signs of problems, such as the baby not getting enough oxygen. During this test, a belt is placed around your belly. The baby is made to move, and its heart rate is monitored during movement.  What is false labor? False labor is a condition in which you feel small, irregular tightenings of the muscles in the womb (contractions) that usually go away with rest, changing position, or drinking water. These are called Braxton Hicks contractions. Contractions may last for hours, days, or even weeks before true labor sets in. If contractions come at regular intervals, become more frequent, increase in intensity, or become painful, you should see your health care provider. What are the signs of labor?  Abdominal cramps.  Regular contractions that start at 10 minutes apart and become stronger and more frequent with time.  Contractions that start on the top of the uterus and spread down to the lower abdomen and back.  Increased pelvic pressure and dull back pain.  A watery or bloody mucus discharge that comes from the vagina.  Leaking of amniotic fluid. This is also known as your "water breaking." It could be a slow trickle or a gush. Let your health care provider know if it has a color or strange odor. If you have any of these signs, call your health care provider right away, even if it is before your due date. Follow these instructions at home: Medicines  Follow your health care provider's instructions regarding medicine use. Specific medicines may be either safe or unsafe to take during pregnancy.  Take a prenatal vitamin that contains at least 600 micrograms (mcg) of folic acid.  If you develop constipation, try taking a stool softener if your health care provider approves. Eating and drinking  Eat a balanced diet that includes fresh fruits and vegetables, whole grains, good sources of protein  such as meat, eggs, or tofu, and low-fat dairy. Your health care provider will help you determine the amount of weight gain that is right for you.  Avoid raw meat and uncooked cheese. These carry germs that can cause birth defects in the baby.  If you have low calcium intake from food, talk to your health care provider about whether you should take a daily calcium supplement.  Eat four or five small meals rather than three large meals a day.  Limit foods that are high in fat and processed sugars, such as fried and sweet foods.  To prevent constipation: ? Drink enough fluid to keep your urine clear or pale yellow. ? Eat foods that are high in fiber, such as fresh fruits and vegetables, whole grains, and beans. Activity  Exercise only as directed by your health care provider. Most women can continue their usual exercise routine during pregnancy. Try to exercise for 30 minutes at least 5 days a week. Stop exercising if you experience uterine contractions.  Avoid heavy   lifting.  Do not exercise in extreme heat or humidity, or at high altitudes.  Wear low-heel, comfortable shoes.  Practice good posture.  You may continue to have sex unless your health care provider tells you otherwise. Relieving pain and discomfort  Take frequent breaks and rest with your legs elevated if you have leg cramps or low back pain.  Take warm sitz baths to soothe any pain or discomfort caused by hemorrhoids. Use hemorrhoid cream if your health care provider approves.  Wear a good support bra to prevent discomfort from breast tenderness.  If you develop varicose veins: ? Wear support pantyhose or compression stockings as told by your healthcare provider. ? Elevate your feet for 15 minutes, 3-4 times a day. Prenatal care  Write down your questions. Take them to your prenatal visits.  Keep all your prenatal visits as told by your health care provider. This is important. Safety  Wear your seat belt at  all times when driving.  Make a list of emergency phone numbers, including numbers for family, friends, the hospital, and police and fire departments. General instructions  Avoid cat litter boxes and soil used by cats. These carry germs that can cause birth defects in the baby. If you have a cat, ask someone to clean the litter box for you.  Do not travel far distances unless it is absolutely necessary and only with the approval of your health care provider.  Do not use hot tubs, steam rooms, or saunas.  Do not drink alcohol.  Do not use any products that contain nicotine or tobacco, such as cigarettes and e-cigarettes. If you need help quitting, ask your health care provider.  Do not use any medicinal herbs or unprescribed drugs. These chemicals affect the formation and growth of the baby.  Do not douche or use tampons or scented sanitary pads.  Do not cross your legs for long periods of time.  To prepare for the arrival of your baby: ? Take prenatal classes to understand, practice, and ask questions about labor and delivery. ? Make a trial run to the hospital. ? Visit the hospital and tour the maternity area. ? Arrange for maternity or paternity leave through employers. ? Arrange for family and friends to take care of pets while you are in the hospital. ? Purchase a rear-facing car seat and make sure you know how to install it in your car. ? Pack your hospital bag. ? Prepare the baby's nursery. Make sure to remove all pillows and stuffed animals from the baby's crib to prevent suffocation.  Visit your dentist if you have not gone during your pregnancy. Use a soft toothbrush to brush your teeth and be gentle when you floss. Contact a health care provider if:  You are unsure if you are in labor or if your water has broken.  You become dizzy.  You have mild pelvic cramps, pelvic pressure, or nagging pain in your abdominal area.  You have lower back pain.  You have persistent  nausea, vomiting, or diarrhea.  You have an unusual or bad smelling vaginal discharge.  You have pain when you urinate. Get help right away if:  Your water breaks before 37 weeks.  You have regular contractions less than 5 minutes apart before 37 weeks.  You have a fever.  You are leaking fluid from your vagina.  You have spotting or bleeding from your vagina.  You have severe abdominal pain or cramping.  You have rapid weight loss or weight gain.    You have shortness of breath with chest pain.  You notice sudden or extreme swelling of your face, hands, ankles, feet, or legs.  Your baby makes fewer than 10 movements in 2 hours.  You have severe headaches that do not go away when you take medicine.  You have vision changes. Summary  The third trimester is from week 28 through week 40, months 7 through 9. The third trimester is a time when the unborn baby (fetus) is growing rapidly.  During the third trimester, your discomfort may increase as you and your baby continue to gain weight. You may have abdominal, leg, and back pain, sleeping problems, and an increased need to urinate.  During the third trimester your breasts will keep growing and they will continue to become tender. A yellow fluid (colostrum) may leak from your breasts. This is the first milk you are producing for your baby.  False labor is a condition in which you feel small, irregular tightenings of the muscles in the womb (contractions) that eventually go away. These are called Braxton Hicks contractions. Contractions may last for hours, days, or even weeks before true labor sets in.  Signs of labor can include: abdominal cramps; regular contractions that start at 10 minutes apart and become stronger and more frequent with time; watery or bloody mucus discharge that comes from the vagina; increased pelvic pressure and dull back pain; and leaking of amniotic fluid. This information is not intended to replace advice  given to you by your health care provider. Make sure you discuss any questions you have with your health care provider. Document Released: 11/19/2001 Document Revised: 05/02/2016 Document Reviewed: 01/26/2013 Elsevier Interactive Patient Education  2017 [employer]  

## 2018-04-03 NOTE — Progress Notes (Signed)
Routine Prenatal Care Visit  Subjective  Regina Chandler is a 27 y.o. [phone] at 3732w4d being seen today for ongoing prenatal care.  She is currently monitored for the following issues for this high-risk pregnancy and has Nausea and vomiting of pregnancy, antepartum; Obesity affecting pregnancy in second trimester; Headache in pregnancy, antepartum, third trimester; Supervision of high risk pregnancy, antepartum; Back pain affecting pregnancy in second trimester; Chronic hepatitis B affecting antepartum care of mother [facility](HCC); BMI 45.0-49.9, adult (HCC); Obesity in pregnancy, antepartum; Chronic hypertension during pregnancy, antepartum; and Influenza A on their problem list.  ----------------------------------------------------------------------------------- Patient reports dizziness and ongoing vomiting that she thinks is pregnancy related. She denies headache, visual changes or epigastric pain.   Contractions: Not present. Vag. Bleeding: None.  Movement: Present. Denies leaking of fluid.  ----------------------------------------------------------------------------------- The following portions of the patient's history were reviewed and updated as appropriate: allergies, current medications, past family history, past medical history, past social history, past surgical history and problem list. Problem list updated.   Objective  Blood pressure (!) 142/98, weight (!) 312 lb (141.5 kg), last menstrual period 09/01/2017 Pregravid weight 307 lb (139.3 kg) Total Weight Gain 5 lb (2.268 kg)  Repeat blood pressure: 156/98  Urinalysis: Urine Protein: Negative Urine Glucose: Negative  Fetal Status: Fetal Heart Rate (bpm): 128   Movement: Present     General:  Alert, oriented and cooperative. Patient is in no acute distress.  Skin: Skin is warm and dry. No rash noted.   Cardiovascular: Normal heart rate noted  Respiratory: Normal respiratory effort, no problems with respiration noted  Abdomen: Soft,  gravid, appropriate for gestational age. Pain/Pressure: Absent     Pelvic:  Cervical exam deferred        Extremities: Normal range of motion.  Edema: Trace  Mental Status: Normal mood and affect. Normal behavior. Normal judgment and thought content.   Assessment   27 y.o. [id number] at 2132w4d by  06/08/2018, by Last Menstrual Period presenting for routine prenatal visit  Plan   pregnancy #6 Problems (from 12/19/17 to present)    Problem Noted Resolved   Obesity in pregnancy, antepartum 12/19/2017 by Natale MilchSchuman, Christanna R, MD No   Overview Addendum 12/19/2017  4:55 PM by Natale MilchSchuman, Christanna R, MD    BMI >=40 [ ]  early 1h gtt -  [ ]  u/s for dating [ ]   Arly.Keller[X ] nutritional goals Arly.Keller[X ] folic acid 1mg  Arly.Keller[X ] bASA (>12 weeks) Arly.Keller[X ] consider nutrition consult [ ]  consider maternal EKG 1st trimester [ ]  Growth u/s 28 [ ] , 32 [ ] , 36 weeks [ ]  [ ]  NST/AFI weekly 36+ weeks (36[] , 37[] , 38[] , 39[] , 40[] ) [ ]  IOL by 41 weeks (scheduled, prn [] )       Chronic hypertension during pregnancy, antepartum 12/19/2017 by Natale MilchSchuman, Christanna R, MD No   Overview Signed 12/19/2017  4:52 PM by Natale MilchSchuman, Christanna R, MD    Arly.Keller[X ] Aspirin 81 mg daily after 12 weeks; discontinue after 36 weeks [ ]  baseline labs with CBC, CMP, urine protein/creatinine ratio [ ]  no BP meds unless BPs become elevated [ ]  ultrasound for growth at 28, 32, 36 weeks [ ]  Aspirin 81 mg daily after 12 weeks; discontinue after 36 weeks [ ]  Baseline EKG   Current antihypertensives:  None   Baseline and surveillance [facility], refresh links as needed)  Lab Results  Component Value Date   PLT 201 12/03/2017   CREATININE 0.47 12/03/2017   AST 18 12/03/2017  ALT 19 12/03/2017   PROTCRRATIO 0.13 11/24/2016    Antenatal Testing CHTN - O10.919  Group I  BP < 140/90, no preeclampsia, AGA,  nml AFV, +/- meds    Group II BP > 140/90, on meds, no preeclampsia, AGA, nml AFV  20-28-34-38  20-24-28-32-35-38  32//2 x  wk  28//BPP wkly then 32//2 x wk  40 no meds; 39 meds  PRN or 37  Pre-eclampsia  GHTN - O13.9/Preeclampsia without severe features  - O14.00   Preeclampsia with severe features - O14.10  Q 3-4wks  Q 2 wks  28//BPP wkly then 32//2 x wk  Inpatient  37  PRN or 34         Obesity affecting pregnancy in second trimester 10/18/2016 by Nadara Mustard, MD No       Preterm labor symptoms and general obstetric precautions including but not limited to vaginal bleeding, contractions, leaking of fluid and fetal movement were reviewed in detail with the patient. Please refer to After Visit Summary for other counseling recommendations.   Return in about 2 weeks (around 04/17/2018) for growth/afi and rob.  Tresea Mall, CNM 04/03/2018 11:51 AM

## 2018-04-03 NOTE — H&P (Signed)
OB History & Physical   History of Present Illness:  Chief Complaint: sent over from clinic for elevated blood pressure  HPI:  Regina Chandler is a 27 y.o. [id number] female at 3367w4d dated by LMP consistent with 13 week ultrasound.  Her pregnancy has been complicated by obesity with initial BMI 49.5, Chronic hepatitis B, headache in pregnancy (as early as 11/2017), history of gestational hypertension in prior pregnancies.  She also had chlamydia on 12/19/16 that was treated.   She denies contractions.   She denies leakage of fluid.   She denies vaginal bleeding.   She reports fetal movement.   She has her typical headache that started this morning.  It is all across the frontal region.  She denies visual changes and right upper quadrant pain.  Her last growth ultrasound was on 03/16/18 and was 43rd percentile.  Her chart indicates that she has chronic hypertension. She has one blood pressure noted in the ER on 10/14/2017 that was 122/106.  She had another elevated blood pressure in the ER on 02/12/17 that was 145/83.  She denies a history of elevated blood pressure while not pregnant. She has not had any elevated blood pressures in her clinic visits for her obstetrical care until today.  She states her headaches have begun in the past two weeks. She did have one mention of a headache on 03/16/18 in a clinic visit.  She has not had a test of cure.   Maternal Medical History:   Past Medical History:  Diagnosis Date  . Anemia   . Anxiety   . Bipolar 1 disorder (HCC)   . Depression   . GERD (gastroesophageal reflux disease)   . Headache   . Hepatitis B affecting pregnancy 2007   No current treatment required, titers show up negative    Past Surgical History:  Procedure Laterality Date  . NO PAST SURGERIES    . NO PAST SURGERIES      Allergies  Allergen Reactions  . Penicillins Swelling    Has patient had a PCN reaction causing immediate rash, facial/tongue/throat swelling, SOB or  lightheadedness with hypotension: No Has patient had a PCN reaction causing severe rash involving mucus membranes or skin necrosis: No Has patient had a PCN reaction that required hospitalization No Has patient had a PCN reaction occurring within the last 10 years: No If all of the above answers are "NO", then may proceed with Cephalosporin use.    Prior to Admission medications   Medication Sig Start Date End Date Taking? Authorizing Provider  Butalbital-APAP-Caffeine 50-325-40 MG capsule Take 1-2 capsules by mouth every 6 (six) hours as needed for headache. 03/16/18  Yes Oswaldo ConroySchmid, Jacelyn Y, CNM  doxylamine, Sleep, (UNISOM) 25 MG tablet Take 1 tablet (25 mg total) by mouth at bedtime. 12/19/17  Yes Schuman, Jaquelyn Bitterhristanna R, MD  folic acid (FOLVITE) 1 MG tablet Take 1 tablet (1 mg total) by mouth daily. 12/19/17  Yes Schuman, Christanna R, MD  ondansetron (ZOFRAN-ODT) 8 MG disintegrating tablet Take 1 tablet (8 mg total) by mouth every 8 (eight) hours as needed for nausea or vomiting. 02/16/18  Yes Farrel ConnersGutierrez, Colleen, CNM  Prenatal Vit-Fe Fumarate-FA (PRENATAL MULTIVITAMIN) TABS tablet Take 1 tablet by mouth daily at 12 noon.   Yes [provider]  ranitidine (ZANTAC) 150 MG tablet Take 1 tablet (150 mg total) by mouth 2 (two) times daily. 02/16/18  Yes Farrel ConnersGutierrez, Colleen, CNM  promethazine (PHENERGAN) 25 MG tablet Take 1 tablet (25 mg total) by  mouth every 6 (six) hours as needed for nausea or vomiting. Patient not taking: Reported on 04/03/2018 12/19/17   Natale Milch, MD  pyridOXINE (VITAMIN B-6) 25 MG tablet Take 1 tablet (25 mg total) by mouth QID. Patient not taking: Reported on 02/16/2018 12/19/17   Natale Milch, MD    OB History  Gravida Para Term Preterm AB Living  6 3 3  0 2 3  SAB TAB Ectopic Multiple Live Births  2     0 3    # Outcome Date GA Lbr Len/2nd Weight Sex Delivery Anes PTL Lv  6 Current           5 Term 11/25/16 [redacted]w[redacted]d 08:09 / 00:08 6 lb 14.8 oz (3.14  kg) F Vag-Spont EPI  LIV  4 Term 03/08/12 [redacted]w[redacted]d  6 lb (2.722 kg) F Vag-Spont  N LIV  3 Term 01/10/11 [redacted]w[redacted]d  7 lb 10 oz (3.459 kg) M Vag-Spont   LIV  2 SAB 2010          1 SAB 2006            Obstetric Comments  IOL with G3 and G5    Prenatal care site: [facility]  Social History: She  reports that she has quit smoking. She has never used smokeless tobacco. She reports that she does not drink alcohol or use drugs.  Family History:  She denies a family of gynecologic cancers  Review of Systems:  Review of Systems  Constitutional: Negative.   HENT: Negative.   Eyes: Negative for blurred vision, double vision, photophobia, pain, discharge and redness.  Respiratory: Negative.   Cardiovascular: Negative.   Gastrointestinal: Negative.   Genitourinary: Negative.   Musculoskeletal: Negative.   Skin: Negative.   Neurological: Positive for dizziness and headaches. Negative for tingling, tremors, sensory change, speech change, focal weakness, seizures and weakness.  Endo/Heme/Allergies: Negative.   Psychiatric/Behavioral: Negative.       Physical Exam:  Vital Signs: BP 131/74   Pulse 80   Temp 98.3 F (36.8 C) (Oral)   Resp 18   Ht 5\' 6"  (1.676 m)   Wt (!) 312 lb (141.5 kg)   LMP 09/01/2017 (Exact Date)   BMI 50.36 kg/m   BP range: 127-143/70-86  Constitutional: Well nourished, well developed female in no acute distress.  HEENT: normal Skin: Warm and dry.  Cardiovascular: Regular rate and rhythm.   Extremity: trace - 1+ pitting edema  Respiratory: Clear to auscultation bilateral. Normal respiratory effort Abdomen: FHT present and gravid/NT, obese Back: no CVAT Neuro: DTRs 2+, Cranial nerves grossly intact Psych: Alert and Oriented x3. No memory deficits. Normal mood and affect.  MS: normal gait, normal bilateral lower extremity ROM/strength/stability.   Pertinent Results:  Prenatal Labs: Blood type/Rh O positive  Antibody screen negative  Rubella Immune    Varicella Immune    RPR NR  HBsAg positive  HIV negative  GC negative  Chlamydia positive  Genetic screening Not done  1 hour GTT 99  3 hour GTT n/a  GBS unknown   Baseline FHR: 130 beats/min   Variability: moderate   Accelerations: present   Decelerations: absent Contractions: absent Overall assessment: cat 1 (limited evaluation due to inability to maintain consistent tracing)  Lab Results  Component Value Date   WBC 6.8 04/03/2018   HGB 9.8 (L) 04/03/2018   HCT 29.4 (L) 04/03/2018   PLT 202 04/03/2018   CREATININE 0.47 04/03/2018   ALT 20 04/03/2018   AST 21  04/03/2018   PROTCRRATIO 0.13 04/03/2018     Assessment:  Regina Chandler is a 27 y.o. [id number] female at [redacted]w[redacted]d with elevated blood pressures.  It is unclear whether the patient has a history of hypertension.  There is little evidence to suggest elevated blood pressures outside pregnancy and she has had normal BPs in all clinic visits.   Plan:  1. Admit to Labor & Delivery  2. CBC, T&S, Clrs, IVF 3. GHTN vs preeclampsia:  Will try to treat headaches. If able to alleviate, will be able to initially follow patient closely as an outpatient.  If unable to remedy her headache, may need to consider inpatient observation for preeclampsia with severe features.  If this is the case, may need to consider transfer to tertiary care center for management prior to 34 weeks. 4. GBS unknown. Will need to collect, if patient to stay inpatient.   5. Fetwal well-being: reassuring overall   Thomasene Mohair, MD 04/03/2018 2:21 PM

## 2018-04-03 NOTE — Progress Notes (Signed)
ROB TDAP given today  C/O back pain, pressure and feeling dizzy and vomiting

## 2018-04-04 DIAGNOSIS — O26893 Other specified pregnancy related conditions, third trimester: Secondary | ICD-10-CM | POA: Diagnosis not present

## 2018-04-04 DIAGNOSIS — O163 Unspecified maternal hypertension, third trimester: Secondary | ICD-10-CM | POA: Diagnosis not present

## 2018-04-04 DIAGNOSIS — Z3A34 34 weeks gestation of pregnancy: Secondary | ICD-10-CM | POA: Diagnosis not present

## 2018-04-04 DIAGNOSIS — R51 Headache: Secondary | ICD-10-CM | POA: Diagnosis not present

## 2018-04-04 MED ORDER — PROCHLORPERAZINE MALEATE 10 MG PO TABS: 10.0000 mg | ORAL_TABLET | Freq: Three times a day (TID) | ORAL | 1 refills | Status: DC | PRN

## 2018-04-04 NOTE — Discharge Summary (Signed)
Physician Discharge Summary  Patient ID: Regina Chandler MRN: [id number] DOB/AGE: 1991/07/13 27 y.o.  Admit date: 04/03/2018 Admitting provider: Conard Novak, MD Discharge date: 04/04/2018   Admission Diagnoses:  1) intrauterine pregnancy at [redacted]w[redacted]d 2) elevated blood pressure in pregnancy, third trimester 3) headache in pregnancy   Discharge Diagnoses:  1) intrauterine pregnancy at [redacted]w[redacted]d 2) elevated blood pressure in pregnancy, third trimester 3) headache in pregnancy  History of Present Illness: The patient is a 27 y.o. female [phone] at [redacted]w[redacted]d who presents for evaluation of headache and elevated blood pressure. She was seen in the office for the same. The patient has a pregnancy history significant for headaches in pregnancy and preeclampsia late in pregnancy. She states that it is typical for her to develop elevated blood pressures at about this point in pregnancy. However, she has gone on to deliver at term in all of her pregnancies.  Her headache is reportedly worse in the past two weeks. She has had other headache episodes earlier in pregnancy, but none this bad. She states that while she normally gets headaches in pregnancy, they are normally not this bad and only occur with this severity in late pregnancy.  Her headache is described as frontal and bilateral.  The pain level is 8/10.  She denies visual changes and any other neurological symptoms.  She denies right upper quadrant pain. She notes +FM, no LOF, and no vaginal bleeding.  Hospital course: the patient was admitted for observation of the above. Her initial blood pressures were elevated in the mild range (>140/90 and < 160/110).  However, after her first couple of BPs, her blood pressures normalized.  Her headache persisted. She was given fioricet, which lowered her pain score a small amount (from 8 to 5).  Norco was then attempted, with another small decrease in pain, but this pain returned to her baseline.  She states that  she normally sleeps her headaches away. So, she was given a combination of compazine and benadryl. She slept overnight and on the morning of discharge her headache was much improved.  As she was normotensive the entire rest of her observation period and her headache had improved, she was considered stable for discharge with a prescription for compazine to be taken with benadryl 25-50 mg.  She had no new and/or concerning symptoms and all of her preeclampsia labs were normal.   Past Medical History:  Diagnosis Date  . Anemia   . Anxiety   . Bipolar 1 disorder (HCC)   . Depression   . GERD (gastroesophageal reflux disease)   . Headache   . Hepatitis B affecting pregnancy 2007   No current treatment required, titers show up negative    Past Surgical History:  Procedure Laterality Date  . NO PAST SURGERIES    . NO PAST SURGERIES      No current facility-administered medications on file prior to encounter.    Current Outpatient Medications on File Prior to Encounter  Medication Sig Dispense Refill  . Butalbital-APAP-Caffeine 50-325-40 MG capsule Take 1-2 capsules by mouth every 6 (six) hours as needed for headache. 30 capsule 0  . doxylamine, Sleep, (UNISOM) 25 MG tablet Take 1 tablet (25 mg total) by mouth at bedtime. 30 tablet 3  . folic acid (FOLVITE) 1 MG tablet Take 1 tablet (1 mg total) by mouth daily. 30 tablet 10  . ondansetron (ZOFRAN-ODT) 8 MG disintegrating tablet Take 1 tablet (8 mg total) by mouth every 8 (eight) hours as needed for  nausea or vomiting. 30 tablet 2  . Prenatal Vit-Fe Fumarate-FA (PRENATAL MULTIVITAMIN) TABS tablet Take 1 tablet by mouth daily at 12 noon.    . ranitidine (ZANTAC) 150 MG tablet Take 1 tablet (150 mg total) by mouth 2 (two) times daily. 60 tablet 2  . promethazine (PHENERGAN) 25 MG tablet Take 1 tablet (25 mg total) by mouth every 6 (six) hours as needed for nausea or vomiting. (Patient not taking: Reported on 04/03/2018) 60 tablet 3  . pyridOXINE  (VITAMIN B-6) 25 MG tablet Take 1 tablet (25 mg total) by mouth QID. (Patient not taking: Reported on 02/16/2018) 120 tablet 3    Allergies  Allergen Reactions  . Penicillins Swelling    Has patient had a PCN reaction causing immediate rash, facial/tongue/throat swelling, SOB or lightheadedness with hypotension: No Has patient had a PCN reaction causing severe rash involving mucus membranes or skin necrosis: No Has patient had a PCN reaction that required hospitalization No Has patient had a PCN reaction occurring within the last 10 years: No If all of the above answers are "NO", then may proceed with Cephalosporin use.    Social History   Socioeconomic History  . Marital status: Single    Spouse name: Not on file  . Number of children: Not on file  . Years of education: Not on file  . Highest education level: Not on file  Occupational History  . Not on file  Social Needs  . Financial resource strain: Not on file  . Food insecurity:    Worry: Not on file    Inability: Not on file  . Transportation needs:    Medical: Not on file    Non-medical: Not on file  Tobacco Use  . Smoking status: Former Games developer  . Smokeless tobacco: Never Used  Substance and Sexual Activity  . Alcohol use: No  . Drug use: No  . Sexual activity: Yes    Birth control/protection: Surgical  Lifestyle  . Physical activity:    Days per week: Not on file    Minutes per session: Not on file  . Stress: Not on file  Relationships  . Social connections:    Talks on phone: Not on file    Gets together: Not on file    Attends religious service: Not on file    Active member of club or organization: Not on file    Attends meetings of clubs or organizations: Not on file    Relationship status: Not on file  . Intimate partner violence:    Fear of current or ex partner: Not on file    Emotionally abused: Not on file    Physically abused: Not on file    Forced sexual activity: Not on file  Other Topics  Concern  . Not on file  Social History Narrative  . Not on file   Family History  Problem Relation Age of Onset  . Cancer Neg Hx   . Diabetes Neg Hx   . Heart disease Neg Hx   . Stroke Neg Hx     Review of Systems  Constitutional: Negative.   HENT: Negative.   Eyes: Negative.   Respiratory: Negative.   Cardiovascular: Negative.   Gastrointestinal: Negative.   Genitourinary: Negative.   Musculoskeletal: Negative.   Skin: Negative.   Neurological: Negative.   Psychiatric/Behavioral: Negative.      Physical Exam: BP 114/64   Pulse 83   Temp 97.7 F (36.5 C) (Oral)   Resp 18  Ht  (1.676 m)   Wt (!) 312 lb (141.5 kg)   LMP 09/01/2017 (Exact Date)   BMI 50.36 kg/m   Physical Exam  Constitutional: She is oriented to person, place, and time. She appears well-developed and well-nourished. No distress.  HENT:  Head: Normocephalic and atraumatic.  Eyes: Conjunctivae are normal.  Neck: Normal range of motion. Neck supple. No thyromegaly present.  Cardiovascular: Normal rate, regular rhythm and normal heart sounds. Exam reveals no gallop and no friction rub.  No murmur heard. Pulmonary/Chest: Effort normal and breath sounds normal. She has no wheezes.  Abdominal: Soft. She exhibits no distension. There is no tenderness. There is no rebound and no guarding. No hernia.  gravid  Genitourinary: Pelvic exam was performed with patient supine.  Musculoskeletal: Normal range of motion.  Lymphadenopathy:       Right: No inguinal adenopathy present.       Left: No inguinal adenopathy present.  Neurological: She is alert and oriented to person, place, and time.  Skin: Skin is warm and dry. No rash noted.  Psychiatric: She has a normal mood and affect. Her behavior is normal. Judgment normal.     Consults: None  Significant Findings/ Diagnostic Studies:  Lab Results  Component Value Date   WBC 6.8 04/03/2018   HGB 9.8 (L) 04/03/2018   HCT 29.4 (L) 04/03/2018   PLT 202  04/03/2018   CREATININE 0.47 04/03/2018   ALT 20 04/03/2018   AST 21 04/03/2018   PROTCRRATIO 0.13 04/03/2018    Procedures:  NST: Baseline FHR: 130 beats/min Variability: moderate Accelerations: present Decelerations: absent Tocometry: quiet  Interpretation:  INDICATIONS: headache in pregnancy RESULTS:  A NST procedure was performed with FHR monitoring and a normal baseline established, appropriate time of 20-40 minutes of evaluation, and accels >2 seen w 15x15 characteristics.  Results show a REACTIVE NST.    Discharge Condition: stable  Disposition: Discharge disposition: 01-Home or Self Care       Diet: Regular diet  Discharge Activity: Activity as tolerated   Allergies as of 04/04/2018      Reactions   Penicillins Swelling   Has patient had a PCN reaction causing immediate rash, facial/tongue/throat swelling, SOB or lightheadedness with hypotension: No Has patient had a PCN reaction causing severe rash involving mucus membranes or skin necrosis: No Has patient had a PCN reaction that required hospitalization No Has patient had a PCN reaction occurring within the last 10 years: No If all of the above answers are "NO", then may proceed with Cephalosporin use.      Medication List    STOP taking these medications   doxylamine (Sleep) 25 MG tablet Commonly known as:  UNISOM   promethazine 25 MG tablet Commonly known as:  PHENERGAN   pyridOXINE 25 MG tablet Commonly known as:  VITAMIN B-6     TAKE these medications   Butalbital-APAP-Caffeine 50-325-40 MG capsule Take 1-2 capsules by mouth every 6 (six) hours as needed for headache.   folic acid 1 MG tablet Commonly known as:  FOLVITE Take 1 tablet (1 mg total) by mouth daily.   ondansetron 8 MG disintegrating tablet Commonly known as:  ZOFRAN-ODT Take 1 tablet (8 mg total) by mouth every 8 (eight) hours as needed for nausea or vomiting.   prenatal multivitamin Tabs tablet Take 1 tablet by mouth daily  at 12 noon.   prochlorperazine 10 MG tablet Commonly known as:  COMPAZINE Take 1 tablet (10 mg total) by mouth every 8 (  eight) hours as needed for nausea or vomiting (or headache).   ranitidine 150 MG tablet Commonly known as:  ZANTAC Take 1 tablet (150 mg total) by mouth 2 (two) times daily.      Follow-up Information    [facility]. Call on 04/06/2018.   Specialty:  Obstetrics and Gynecology Why:  Call to schedule a blood pressure check and headache follow up for early next week Contact information: [address] [place] [address] [phone]          Total time spent taking care of this patient: 45 minutes  Signed: Thomasene Mohair, MD  04/04/2018, 9:14 AM

## 2018-04-08 ENCOUNTER — Ambulatory Visit (INDEPENDENT_AMBULATORY_CARE_PROVIDER_SITE_OTHER): Payer: Medicaid Other | Admitting: Advanced Practice Midwife

## 2018-04-08 ENCOUNTER — Encounter: Payer: Self-pay | Admitting: Advanced Practice Midwife

## 2018-04-08 VITALS — BP 122/88 | Wt 303.0 lb

## 2018-04-08 DIAGNOSIS — Z3A31 31 weeks gestation of pregnancy: Secondary | ICD-10-CM

## 2018-04-08 NOTE — Progress Notes (Signed)
Routine Prenatal Care Visit  Subjective  Regina Chandler is a 27 y.o. [phone] at [redacted]w[redacted]d being seen today for ongoing prenatal care.  She is currently monitored for the following issues for this high-risk pregnancy and has Nausea and vomiting of pregnancy, antepartum; Obesity affecting pregnancy in second trimester; Headache in pregnancy, antepartum, third trimester; Supervision of high risk pregnancy, antepartum; Back pain affecting pregnancy in second trimester; Chronic hepatitis B affecting antepartum care of mother [facility]); BMI 45.0-49.9, adult (HCC); Obesity in pregnancy, antepartum; Chronic hypertension during pregnancy, antepartum; Influenza A; and Elevated blood pressure affecting pregnancy in third trimester, antepartum on their problem list.  ----------------------------------------------------------------------------------- Patient reports still has headache. She has taken fioricet which helps some.   Contractions: Not present. Vag. Bleeding: None.  Movement: Present. Denies leaking of fluid.  ----------------------------------------------------------------------------------- The following portions of the patient's history were reviewed and updated as appropriate: allergies, current medications, past family history, past medical history, past social history, past surgical history and problem list. Problem list updated.   Objective  Blood pressure 122/88, weight (!) 303 lb (137.4 kg), last menstrual period 09/01/2017, unknown if currently breastfeeding. Pregravid weight 307 lb (139.3 kg) Total Weight Gain -4 lb (-1.814 kg) Urinalysis: Urine Protein: Negative Urine Glucose: Negative  Fetal Status: Fetal Heart Rate (bpm): 123   Movement: Present     General:  Alert, oriented and cooperative. Patient is in no acute distress.  Skin: Skin is warm and dry. No rash noted.   Cardiovascular: Normal heart rate noted  Respiratory: Normal respiratory effort, no problems with respiration noted    Abdomen: Soft, gravid, appropriate for gestational age. Pain/Pressure: Present     Pelvic:  Cervical exam deferred        Extremities: Normal range of motion.  Edema: Trace  Mental Status: Normal mood and affect. Normal behavior. Normal judgment and thought content.   Assessment   27 y.o. [id number] at [redacted]w[redacted]d by  06/08/2018, by Last Menstrual Period presenting for Blood Pressure check prenatal visit  Plan   pregnancy #6 Problems (from 12/19/17 to present)    Problem Noted Resolved   Obesity in pregnancy, antepartum 12/19/2017 by Natale Milch, MD No   Overview Addendum 12/19/2017  4:55 PM by Natale Milch, MD    BMI >=40  early 1h gtt -   u/s for dating   Arly.Keller ] nutritional goals Arly.Keller ] folic acid  Arly.Keller ] bASA (>12 weeks) Arly.Keller ] consider nutrition consult  consider maternal EKG 1st trimester  Growth u/s 28 , 32 , 36 weeks   NST/AFI weekly 36+ weeks (36[] , 37[] , 38[] , 39[] , 40[] )  IOL by 41 weeks (scheduled, prn )       Chronic hypertension during pregnancy, antepartum 12/19/2017 by Natale Milch, MD No   Overview Signed 12/19/2017  4:52 PM by Natale Milch, MD    Arly.Keller ] Aspirin 81 mg daily after 12 weeks; discontinue after 36 weeks  baseline labs with CBC, CMP, urine protein/creatinine ratio  no BP meds unless BPs become elevated  ultrasound for growth at 28, 32, 36 weeks  Aspirin 81 mg daily after 12 weeks; discontinue after 36 weeks  Baseline EKG   Current antihypertensives:  None   Baseline and surveillance labs (pulled in from [facility], refresh links as needed)  Lab Results  Component Value Date   PLT 201 12/03/2017   CREATININE 0.47 12/03/2017   AST 18  12/03/2017   ALT 19 12/03/2017   PROTCRRATIO 0.13 11/24/2016    Antenatal Testing CHTN - O10.919  Group I  BP < 140/90, no preeclampsia, AGA,  nml AFV, +/- meds    Group II BP > 140/90, on meds, no preeclampsia, AGA, nml AFV   20-28-34-38  20-24-28-32-35-38  32//2 x wk  28//BPP wkly then 32//2 x wk  40 no meds; 39 meds  PRN or 37  Pre-eclampsia  GHTN - O13.9/Preeclampsia without severe features  - O14.00   Preeclampsia with severe features - O14.10  Q 3-4wks  Q 2 wks  28//BPP wkly then 32//2 x wk  Inpatient  37  PRN or 34         Obesity affecting pregnancy in second trimester 10/18/2016 by Nadara Mustard, MD No       Preterm labor symptoms and general obstetric precautions including but not limited to vaginal bleeding, contractions, leaking of fluid and fetal movement were reviewed in detail with the patient. Please refer to After Visit Summary for other counseling recommendations.   Next appointment is already scheduled  Tresea Mall, CNM 04/08/2018 9:32 AM

## 2018-04-17 ENCOUNTER — Ambulatory Visit (INDEPENDENT_AMBULATORY_CARE_PROVIDER_SITE_OTHER): Payer: Medicaid Other | Admitting: Certified Nurse Midwife

## 2018-04-17 ENCOUNTER — Ambulatory Visit (INDEPENDENT_AMBULATORY_CARE_PROVIDER_SITE_OTHER): Payer: Medicaid Other

## 2018-04-17 VITALS — BP 122/82 | Wt 303.0 lb

## 2018-04-17 DIAGNOSIS — O0990 Supervision of high risk pregnancy, unspecified, unspecified trimester: Secondary | ICD-10-CM

## 2018-04-17 DIAGNOSIS — Z6841 Body Mass Index (BMI) 40.0 and over, adult: Secondary | ICD-10-CM | POA: Diagnosis not present

## 2018-04-17 DIAGNOSIS — O2203 Varicose veins of lower extremity in pregnancy, third trimester: Secondary | ICD-10-CM

## 2018-04-17 DIAGNOSIS — A749 Chlamydial infection, unspecified: Secondary | ICD-10-CM

## 2018-04-17 DIAGNOSIS — Z3A32 32 weeks gestation of pregnancy: Secondary | ICD-10-CM

## 2018-04-17 DIAGNOSIS — O98819 Other maternal infectious and parasitic diseases complicating pregnancy, unspecified trimester: Secondary | ICD-10-CM

## 2018-04-17 NOTE — Progress Notes (Signed)
Pt c/o feeling tired and pressure in pelvic area, cramps in hips and legs and pain in lower back. Growth u/s today.

## 2018-04-18 NOTE — Progress Notes (Signed)
HROB at 32wk4d/ growth scan: Complains of hip, groin, and leg pain as well as difficulty walking. Having pelvic pressure and mucous discharge. Some vulvar itching, Complains of varicose veins and swelling in lower legs. Baby active. No vaginal bleeding or leakage of water.  Desires BTL and 30 day papers signed today. Discussed that the BTL may be done as an interval tubal.  BMI today 48.9 kg/m2 Growth scan: 4#9oz (51st%) with AFI 16.62 cm.  Exam: Vulva: no inflammation or lesions Vagina: white discharge Wet prep: negative hyphae, Trich, clue cells Cervix: FT/THick/OOP EXtremities: +1 to +2  Edema of LE with tortuous varicose veins up to popliteal area bilaterally A: normal growth on ultrasound Extensive varicose veins P: RX: Compression hose (not sure if she will be able to get size she needs/ elevate legs above level of heart BID Breat and bottle TOC Aptima done Farrel Conners, CNM

## 2018-04-23 ENCOUNTER — Encounter: Payer: Self-pay | Admitting: Certified Nurse Midwife

## 2018-04-23 ENCOUNTER — Other Ambulatory Visit: Payer: Self-pay | Admitting: Certified Nurse Midwife

## 2018-04-23 DIAGNOSIS — O98819 Other maternal infectious and parasitic diseases complicating pregnancy, unspecified trimester: Secondary | ICD-10-CM

## 2018-04-23 DIAGNOSIS — A749 Chlamydial infection, unspecified: Secondary | ICD-10-CM | POA: Insufficient documentation

## 2018-04-23 LAB — CHLAMYDIA/GONOCOCCUS/TRICHOMONAS, NAA
Chlamydia by NAA: POSITIVE — AB
Gonococcus by NAA: NEGATIVE
Trich vag by NAA: NEGATIVE

## 2018-04-23 MED ORDER — AZITHROMYCIN 500 MG PO TABS: ORAL_TABLET | ORAL | 0 refills | Status: DC

## 2018-04-23 NOTE — Progress Notes (Signed)
Called patient and advised that TOC for Chlamydia was positive. RX for Azithromycin 1 GM called in for her and her partner. Advised no intercourse until 1 week after both have been treated. [employer]

## 2018-05-01 ENCOUNTER — Ambulatory Visit (INDEPENDENT_AMBULATORY_CARE_PROVIDER_SITE_OTHER): Payer: 59 | Admitting: Advanced Practice Midwife

## 2018-05-01 ENCOUNTER — Encounter: Payer: Self-pay | Admitting: Advanced Practice Midwife

## 2018-05-01 VITALS — BP 130/74 | Wt 304.0 lb

## 2018-05-01 DIAGNOSIS — O99213 Obesity complicating pregnancy, third trimester: Secondary | ICD-10-CM

## 2018-05-01 DIAGNOSIS — O99210 Obesity complicating pregnancy, unspecified trimester: Secondary | ICD-10-CM

## 2018-05-01 DIAGNOSIS — O0990 Supervision of high risk pregnancy, unspecified, unspecified trimester: Secondary | ICD-10-CM

## 2018-05-01 DIAGNOSIS — O0993 Supervision of high risk pregnancy, unspecified, third trimester: Secondary | ICD-10-CM

## 2018-05-01 DIAGNOSIS — Z3A34 34 weeks gestation of pregnancy: Secondary | ICD-10-CM

## 2018-05-01 NOTE — Progress Notes (Signed)
34 wk Instructions [facility]'s given. C/o pelvic pain/pressure since last visit. Denies LOF/vag bleeding.

## 2018-05-01 NOTE — Progress Notes (Signed)
Routine Prenatal Care Visit  Subjective  Regina Chandler is a 27 y.o. [id number] at [redacted]w[redacted]d being seen today for ongoing prenatal care.  She is currently monitored for the following issues for this high-risk pregnancy and has Nausea and vomiting of pregnancy, antepartum; Obesity affecting pregnancy in second trimester; Headache in pregnancy, antepartum, third trimester; Supervision of high risk pregnancy, antepartum; Back pain affecting pregnancy in second trimester; Chronic hepatitis B affecting antepartum care of mother [facility]); BMI 45.0-49.9, adult (HCC); Obesity in pregnancy, antepartum; Chronic hypertension during pregnancy, antepartum; Influenza A; Elevated blood pressure affecting pregnancy in third trimester, antepartum; Varicose veins of legs in pregnancy, third trimester; and Chlamydia infection affecting pregnancy on their problem list.  ----------------------------------------------------------------------------------- Patient reports backache and pelvic pressure and irregular contractions especially while at work and when laying down.   Contractions: Irregular. Vag. Bleeding: None.  Movement: Present. Denies leaking of fluid.  ----------------------------------------------------------------------------------- The following portions of the patient's history were reviewed and updated as appropriate: allergies, current medications, past family history, past medical history, past social history, past surgical history and problem list. Problem list updated.   Objective  Blood pressure 130/74, weight (!) 304 lb (137.9 kg), last menstrual period 09/01/2017 Pregravid weight 307 lb (139.3 kg) Total Weight Gain -3 lb (-1.361 kg) Urinalysis: Urine Protein: Negative Urine Glucose: Negative  Fetal Status: Fetal Heart Rate (bpm): 127   Movement: Present     General:  Alert, oriented and cooperative. Patient is in no acute distress.  Skin: Skin is warm and dry. No rash noted.   Cardiovascular: Normal  heart rate noted  Respiratory: Normal respiratory effort, no problems with respiration noted  Abdomen: Soft, gravid, appropriate for gestational age. Pain/Pressure: Present     Pelvic:  Cervical exam deferred        Extremities: Normal range of motion.     Mental Status: Normal mood and affect. Normal behavior. Normal judgment and thought content.   Assessment   27 y.o. [id number] at [redacted]w[redacted]d by  06/08/2018, by Last Menstrual Period presenting for routine prenatal visit  Plan   pregnancy #6 Problems (from 12/19/17 to present)    Problem Noted Resolved   Varicose veins of legs in pregnancy, third trimester 04/17/2018 by Farrel Conners, CNM No   Obesity in pregnancy, antepartum 12/19/2017 by Natale Milch, MD No   Overview Addendum 12/19/2017  4:55 PM by Natale Milch, MD    BMI >=40  early 1h gtt -   u/s for dating   Arly.Keller ] nutritional goals Arly.Keller ] folic acid  Arly.Keller ] bASA (>12 weeks) Arly.Keller ] consider nutrition consult  consider maternal EKG 1st trimester  Growth u/s 28 , 32 , 36 weeks   NST/AFI weekly 36+ weeks (36[] , 37[] , 38[] , 39[] , 40[] )  IOL by 41 weeks (scheduled, prn )       Chronic hypertension during pregnancy, antepartum 12/19/2017 by Natale Milch, MD No   Overview Signed 12/19/2017  4:52 PM by Natale Milch, MD    Arly.Keller ] Aspirin 81 mg daily after 12 weeks; discontinue after 36 weeks  baseline labs with CBC, CMP, urine protein/creatinine ratio  no BP meds unless BPs become elevated  ultrasound for growth at 28, 32, 36 weeks  Aspirin 81 mg daily after 12 weeks; discontinue after 36 weeks  Baseline EKG   Current antihypertensives:  None   Baseline and surveillance labs (pulled in from EPIC,  refresh links as needed)  Lab Results  Component Value Date   PLT 201 12/03/2017   CREATININE 0.47 12/03/2017   AST 18 12/03/2017   ALT 19 12/03/2017   PROTCRRATIO 0.13 11/24/2016    Antenatal Testing CHTN -  O10.919  Group I  BP < 140/90, no preeclampsia, AGA,  nml AFV, +/- meds    Group II BP > 140/90, on meds, no preeclampsia, AGA, nml AFV  20-28-34-38  20-24-28-32-35-38  32//2 x wk  28//BPP wkly then 32//2 x wk  40 no meds; 39 meds  PRN or 37  Pre-eclampsia  GHTN - O13.9/Preeclampsia without severe features  - O14.00   Preeclampsia with severe features - O14.10  Q 3-4wks  Q 2 wks  28//BPP wkly then 32//2 x wk  Inpatient  37  PRN or 34         Obesity affecting pregnancy in second trimester 10/18/2016 by Nadara Mustard, MD No       Preterm labor symptoms and general obstetric precautions including but not limited to vaginal bleeding, contractions, leaking of fluid and fetal movement were reviewed in detail with the patient. Encouraged to increase hydration to decrease cramping/headaches  Return in about 2 weeks (around 05/15/2018) for growth/afi/nst/rob.  Tresea Mall, CNM 05/01/2018 9:49 AM

## 2018-05-15 ENCOUNTER — Other Ambulatory Visit: Payer: Self-pay | Admitting: Advanced Practice Midwife

## 2018-05-15 ENCOUNTER — Ambulatory Visit (INDEPENDENT_AMBULATORY_CARE_PROVIDER_SITE_OTHER): Payer: 59 | Admitting: Advanced Practice Midwife

## 2018-05-15 ENCOUNTER — Encounter: Payer: Self-pay | Admitting: Advanced Practice Midwife

## 2018-05-15 ENCOUNTER — Ambulatory Visit (INDEPENDENT_AMBULATORY_CARE_PROVIDER_SITE_OTHER): Payer: 59

## 2018-05-15 VITALS — BP 130/80 | Wt 307.0 lb

## 2018-05-15 DIAGNOSIS — O0993 Supervision of high risk pregnancy, unspecified, third trimester: Secondary | ICD-10-CM

## 2018-05-15 DIAGNOSIS — Z113 Encounter for screening for infections with a predominantly sexual mode of transmission: Secondary | ICD-10-CM

## 2018-05-15 DIAGNOSIS — O99213 Obesity complicating pregnancy, third trimester: Secondary | ICD-10-CM | POA: Diagnosis not present

## 2018-05-15 DIAGNOSIS — O0990 Supervision of high risk pregnancy, unspecified, unspecified trimester: Secondary | ICD-10-CM

## 2018-05-15 DIAGNOSIS — Z3A36 36 weeks gestation of pregnancy: Secondary | ICD-10-CM

## 2018-05-15 DIAGNOSIS — O99210 Obesity complicating pregnancy, unspecified trimester: Secondary | ICD-10-CM

## 2018-05-15 DIAGNOSIS — Z6841 Body Mass Index (BMI) 40.0 and over, adult: Secondary | ICD-10-CM

## 2018-05-15 DIAGNOSIS — Z3685 Encounter for antenatal screening for Streptococcus B: Secondary | ICD-10-CM

## 2018-05-15 NOTE — Progress Notes (Signed)
Having back pain, more pressure, getting harder to walk.

## 2018-05-15 NOTE — Addendum Note (Signed)
Addended by: Tresea MallGLEDHILL, Samnang Shugars on: 05/15/2018 11:05 AM   Modules accepted: Orders

## 2018-05-15 NOTE — Patient Instructions (Signed)
Vaginal Delivery Vaginal delivery means that you will give birth by pushing your baby out of your birth canal (vagina). A team of health care providers will help you before, during, and after vaginal delivery. Birth experiences are unique for every woman and every pregnancy, and birth experiences vary depending on where you choose to give birth. What should I do to prepare for my baby's birth? Before your baby is born, it is important to talk with your health care provider about:  Your labor and delivery preferences. These may include: ? Medicines that you may be given. ? How you will manage your pain. This might include non-medical pain relief techniques or injectable pain relief such as epidural analgesia. ? How you and your baby will be monitored during labor and delivery. ? Who may be in the labor and delivery room with you. ? Your feelings about surgical delivery of your baby (cesarean delivery, or C-section) if this becomes necessary. ? Your feelings about receiving donated blood through an IV tube (blood transfusion) if this becomes necessary.  Whether you are able: ? To take pictures or videos of the birth. ? To eat during labor and delivery. ? To move around, walk, or change positions during labor and delivery.  What to expect after your baby is born, such as: ? Whether delayed umbilical cord clamping and cutting is offered. ? Who will care for your baby right after birth. ? Medicines or tests that may be recommended for your baby. ? Whether breastfeeding is supported in your hospital or birth center. ? How long you will be in the hospital or birth center.  How any medical conditions you have may affect your baby or your labor and delivery experience.  To prepare for your baby's birth, you should also:  Attend all of your health care visits before delivery (prenatal visits) as recommended by your health care provider. This is important.  Prepare your home for your baby's  arrival. Make sure that you have: ? Diapers. ? Baby clothing. ? Feeding equipment. ? Safe sleeping arrangements for you and your baby.  Install a car seat in your vehicle. Have your car seat checked by a certified car seat installer to make sure that it is installed safely.  Think about who will help you with your new baby at home for at least the first several weeks after delivery.  What can I expect when I arrive at the birth center or hospital? Once you are in labor and have been admitted into the hospital or birth center, your health care provider may:  Review your pregnancy history and any concerns you have.  Insert an IV tube into one of your veins. This is used to give you fluids and medicines.  Check your blood pressure, pulse, temperature, and heart rate (vital signs).  Check whether your bag of water (amniotic sac) has broken (ruptured).  Talk with you about your birth plan and discuss pain control options.  Monitoring Your health care provider may monitor your contractions (uterine monitoring) and your baby's heart rate (fetal monitoring). You may need to be monitored:  Often, but not continuously (intermittently).  All the time or for long periods at a time (continuously). Continuous monitoring may be needed if: ? You are taking certain medicines, such as medicine to relieve pain or make your contractions stronger. ? You have pregnancy or labor complications.  Monitoring may be done by:  Placing a special stethoscope or a handheld monitoring device on your abdomen to   check your baby's heartbeat, and feeling your abdomen for contractions. This method of monitoring does not continuously record your baby's heartbeat or your contractions.  Placing monitors on your abdomen (external monitors) to record your baby's heartbeat and the frequency and length of contractions. You may not have to wear external monitors all the time.  Placing monitors inside of your uterus  (internal monitors) to record your baby's heartbeat and the frequency, length, and strength of your contractions. ? Your health care provider may use internal monitors if he or she needs more information about the strength of your contractions or your baby's heart rate. ? Internal monitors are put in place by passing a thin, flexible wire through your vagina and into your uterus. Depending on the type of monitor, it may remain in your uterus or on your baby's head until birth. ? Your health care provider will discuss the benefits and risks of internal monitoring with you and will ask for your permission before inserting the monitors.  Telemetry. This is a type of continuous monitoring that can be done with external or internal monitors. Instead of having to stay in bed, you are able to move around during telemetry. Ask your health care provider if telemetry is an option for you.  Physical exam Your health care provider may perform a physical exam. This may include:  Checking whether your baby is positioned: ? With the head toward your vagina (head-down). This is most common. ? With the head toward the top of your uterus (head-up or breech). If your baby is in a breech position, your health care provider may try to turn your baby to a head-down position so you can deliver vaginally. If it does not seem that your baby can be born vaginally, your provider may recommend surgery to deliver your baby. In rare cases, you may be able to deliver vaginally if your baby is head-up (breech delivery). ? Lying sideways (transverse). Babies that are lying sideways cannot be delivered vaginally.  Checking your cervix to determine: ? Whether it is thinning out (effacing). ? Whether it is opening up (dilating). ? How low your baby has moved into your birth canal.  What are the three stages of labor and delivery?  Normal labor and delivery is divided into the following three stages: Stage 1  Stage 1 is the  longest stage of labor, and it can last for hours or days. Stage 1 includes: ? Early labor. This is when contractions may be irregular, or regular and mild. Generally, early labor contractions are more than 10 minutes apart. ? Active labor. This is when contractions get longer, more regular, more frequent, and more intense. ? The transition phase. This is when contractions happen very close together, are very intense, and may last longer than during any other part of labor.  Contractions generally feel mild, infrequent, and irregular at first. They get stronger, more frequent (about every 2-3 minutes), and more regular as you progress from early labor through active labor and transition.  Many women progress through stage 1 naturally, but you may need help to continue making progress. If this happens, your health care provider may talk with you about: ? Rupturing your amniotic sac if it has not ruptured yet. ? Giving you medicine to help make your contractions stronger and more frequent.  Stage 1 ends when your cervix is completely dilated to 4 inches (10 cm) and completely effaced. This happens at the end of the transition phase. Stage 2  Once   your cervix is completely effaced and dilated to 4 inches (10 cm), you may start to feel an urge to push. It is common for the body to naturally take a rest before feeling the urge to push, especially if you received an epidural or certain other pain medicines. This rest period may last for up to 1-2 hours, depending on your unique labor experience.  During stage 2, contractions are generally less painful, because pushing helps relieve contraction pain. Instead of contraction pain, you may feel stretching and burning pain, especially when the widest part of your baby's head passes through the vaginal opening (crowning).  Your health care provider will closely monitor your pushing progress and your baby's progress through the vagina during stage 2.  Your  health care provider may massage the area of skin between your vaginal opening and anus (perineum) or apply warm compresses to your perineum. This helps it stretch as the baby's head starts to crown, which can help prevent perineal tearing. ? In some cases, an incision may be made in your perineum (episiotomy) to allow the baby to pass through the vaginal opening. An episiotomy helps to make the opening of the vagina larger to allow more room for the baby to fit through.  It is very important to breathe and focus so your health care provider can control the delivery of your baby's head. Your health care provider may have you decrease the intensity of your pushing, to help prevent perineal tearing.  After delivery of your baby's head, the shoulders and the rest of the body generally deliver very quickly and without difficulty.  Once your baby is delivered, the umbilical cord may be cut right away, or this may be delayed for 1-2 minutes, depending on your baby's health. This may vary among health care providers, hospitals, and birth centers.  If you and your baby are healthy enough, your baby may be placed on your chest or abdomen to help maintain the baby's temperature and to help you bond with each other. Some mothers and babies start breastfeeding at this time. Your health care team will dry your baby and help keep your baby warm during this time.  Your baby may need immediate care if he or she: ? Showed signs of distress during labor. ? Has a medical condition. ? Was born too early (prematurely). ? Had a bowel movement before birth (meconium). ? Shows signs of difficulty transitioning from being inside the uterus to being outside of the uterus. If you are planning to breastfeed, your health care team will help you begin a feeding. Stage 3  The third stage of labor starts immediately after the birth of your baby and ends after you deliver the placenta. The placenta is an organ that develops  during pregnancy to provide oxygen and nutrients to your baby in the womb.  Delivering the placenta may require some pushing, and you may have mild contractions. Breastfeeding can stimulate contractions to help you deliver the placenta.  After the placenta is delivered, your uterus should tighten (contract) and become firm. This helps to stop bleeding in your uterus. To help your uterus contract and to control bleeding, your health care provider may: ? Give you medicine by injection, through an IV tube, by mouth, or through your rectum (rectally). ? Massage your abdomen or perform a vaginal exam to remove any blood clots that are left in your uterus. ? Empty your bladder by placing a thin, flexible tube (catheter) into your bladder. ? Encourage   you to breastfeed your baby. After labor is over, you and your baby will be monitored closely to ensure that you are both healthy until you are ready to go home. Your health care team will teach you how to care for yourself and your baby. This information is not intended to replace advice given to you by your health care provider. Make sure you discuss any questions you have with your health care provider. Document Released: 09/03/2008 Document Revised: 06/14/2016 Document Reviewed: 12/10/2015 Elsevier Interactive Patient Education  2018 [employer]  

## 2018-05-15 NOTE — Progress Notes (Addendum)
Routine Prenatal Care Visit  Subjective  Regina Chandler is a 27 y.o. [phone] at [redacted]w[redacted]d being seen today for ongoing prenatal care.  She is currently monitored for the following issues for this high-risk pregnancy and has Nausea and vomiting of pregnancy, antepartum; Headache in pregnancy, antepartum, third trimester; Supervision of high risk pregnancy, antepartum; Back pain affecting pregnancy in second trimester; Chronic hepatitis B affecting antepartum care of mother [facility]); BMI 45.0-49.9, adult (HCC); Obesity in pregnancy, antepartum; Chronic hypertension during pregnancy, antepartum; Influenza A; Elevated blood pressure affecting pregnancy in third trimester, antepartum; Varicose veins of legs in pregnancy, third trimester; and Chlamydia infection affecting pregnancy on their problem list.  ----------------------------------------------------------------------------------- Patient reports backache and headache.  Relief with tylenol and rest/sleep. Contractions: Not present. Vag. Bleeding: None.  Movement: Present. Denies leaking of fluid.  ----------------------------------------------------------------------------------- The following portions of the patient's history were reviewed and updated as appropriate: allergies, current medications, past family history, past medical history, past social history, past surgical history and problem list. Problem list updated.   Objective  Blood pressure 130/80, weight (!) 307 lb (139.3 kg), last menstrual period 09/01/2017 Pregravid weight 307 lb (139.3 kg) Total Weight Gain 0 lb (0 kg) Urinalysis: Urine Protein: Negative Urine Glucose: Negative  Fetal Status: Fetal Heart Rate (bpm): 130   Movement: Present  Presentation: Vertex  NST reactive 130 bpm, moderate variability, +accelerations, -decelerations/ tracing is not continuously connected in short areas but able to hear fetal heart tones throughout without decelerations. Growth 36%, 6 pounds 3  ounces, AFI 17.5  General:  Alert, oriented and cooperative. Patient is in no acute distress.  Skin: Skin is warm and dry. No rash noted.   Cardiovascular: Normal heart rate noted  Respiratory: Normal respiratory effort, no problems with respiration noted  Abdomen: Soft, gravid, appropriate for gestational age. Pain/Pressure: Present     Pelvic:  GBS/aptima specimens collected        Extremities: Normal range of motion.     Mental Status: Normal mood and affect. Normal behavior. Normal judgment and thought content.   Assessment   27 y.o. [id number] at [redacted]w[redacted]d by  06/08/2018, by Last Menstrual Period presenting for routine prenatal visit  Plan   pregnancy #6 Problems (from 12/19/17 to present)    Problem Noted Resolved   Varicose veins of legs in pregnancy, third trimester 04/17/2018 by Farrel Conners, CNM No   Obesity in pregnancy, antepartum 12/19/2017 by Natale Milch, MD No   Overview Addendum 12/19/2017  4:55 PM by Natale Milch, MD    BMI >=40 [ ]  early 1h gtt -  [ ]  u/s for dating [ ]   Arly.Keller ] nutritional goals Arly.Keller ] folic acid 1mg  Arly.Keller ] bASA (>12 weeks) Arly.Keller ] consider nutrition consult [ ]  consider maternal EKG 1st trimester [ ]  Growth u/s 28 [ ] , 32 [ ] , 36 weeks [ ]  [ ]  NST/AFI weekly 36+ weeks (36[] , 37[] , 38[] , 39[] , 40[] ) [ ]  IOL by 41 weeks (scheduled, prn [] )       Chronic hypertension during pregnancy, antepartum 12/19/2017 by Natale Milch, MD No   Overview Signed 12/19/2017  4:52 PM by Natale Milch, MD    Arly.Keller ] Aspirin 81 mg daily after 12 weeks; discontinue after 36 weeks [ ]  baseline labs with CBC, CMP, urine protein/creatinine ratio [ ]  no BP meds unless BPs become elevated [ ]  ultrasound for growth at 28, 32, 36 weeks [ ]  Aspirin 81 mg daily after 12 weeks; discontinue after  36 weeks [ ]  Baseline EKG   Current antihypertensives:  None   Baseline and surveillance labs ([facility]ed in from [facility], refresh links as needed)  Lab Results    Component Value Date   PLT 201 12/03/2017   CREATININE 0.47 12/03/2017   AST 18 12/03/2017   ALT 19 12/03/2017   PROTCRRATIO 0.13 11/24/2016    Antenatal Testing CHTN - O10.919  Group I  BP < 140/90, no preeclampsia, AGA,  nml AFV, +/- meds    Group II BP > 140/90, on meds, no preeclampsia, AGA, nml AFV  20-28-34-38  20-24-28-32-35-38  32//2 x wk  28//BPP wkly then 32//2 x wk  40 no meds; 39 meds  PRN or 37  Pre-eclampsia  GHTN - O13.9/Preeclampsia without severe features  - O14.00   Preeclampsia with severe features - O14.10  Q 3-4wks  Q 2 wks  28//BPP wkly then 32//2 x wk  Inpatient  37  PRN or 34         Obesity affecting pregnancy in second trimester 10/18/2016 by Nadara MustardHarris, Robert P, MD 05/15/2018 by Tresea MallGledhill, Bennett Ram, CNM       Preterm labor symptoms and general obstetric precautions including but not limited to vaginal bleeding, contractions, leaking of fluid and fetal movement were reviewed in detail with the patient. Please refer to After Visit Summary for other counseling recommendations.   Return in about 1 week (around 05/22/2018) for afi/nst/rob.  Tresea MallJane Desirey Keahey, CNM 05/15/2018 8:52 AM

## 2018-05-17 ENCOUNTER — Observation Stay
Admission: EM | Admit: 2018-05-17 | Discharge: 2018-05-17 | Disposition: A | Discharge status: Home / Self Care | Payer: Managed Care, Other (non HMO) | Attending: Obstetrics and Gynecology | Admitting: Obstetrics and Gynecology

## 2018-05-17 ENCOUNTER — Encounter: Payer: Self-pay | Admitting: *Deleted

## 2018-05-17 ENCOUNTER — Other Ambulatory Visit: Payer: Self-pay

## 2018-05-17 DIAGNOSIS — O163 Unspecified maternal hypertension, third trimester: Secondary | ICD-10-CM | POA: Diagnosis not present

## 2018-05-17 DIAGNOSIS — O99210 Obesity complicating pregnancy, unspecified trimester: Secondary | ICD-10-CM

## 2018-05-17 DIAGNOSIS — Z79899 Other long term (current) drug therapy: Secondary | ICD-10-CM | POA: Insufficient documentation

## 2018-05-17 DIAGNOSIS — Z8619 Personal history of other infectious and parasitic diseases: Secondary | ICD-10-CM | POA: Insufficient documentation

## 2018-05-17 DIAGNOSIS — Z3A36 36 weeks gestation of pregnancy: Secondary | ICD-10-CM | POA: Insufficient documentation

## 2018-05-17 DIAGNOSIS — Z87891 Personal history of nicotine dependence: Secondary | ICD-10-CM | POA: Insufficient documentation

## 2018-05-17 DIAGNOSIS — O2203 Varicose veins of lower extremity in pregnancy, third trimester: Secondary | ICD-10-CM

## 2018-05-17 DIAGNOSIS — O99613 Diseases of the digestive system complicating pregnancy, third trimester: Secondary | ICD-10-CM | POA: Diagnosis not present

## 2018-05-17 DIAGNOSIS — R51 Headache: Secondary | ICD-10-CM

## 2018-05-17 DIAGNOSIS — F319 Bipolar disorder, unspecified: Secondary | ICD-10-CM | POA: Insufficient documentation

## 2018-05-17 DIAGNOSIS — F419 Anxiety disorder, unspecified: Secondary | ICD-10-CM | POA: Insufficient documentation

## 2018-05-17 DIAGNOSIS — Z880 Allergy status to penicillin: Secondary | ICD-10-CM | POA: Insufficient documentation

## 2018-05-17 DIAGNOSIS — O26893 Other specified pregnancy related conditions, third trimester: Secondary | ICD-10-CM

## 2018-05-17 DIAGNOSIS — O99343 Other mental disorders complicating pregnancy, third trimester: Secondary | ICD-10-CM | POA: Insufficient documentation

## 2018-05-17 DIAGNOSIS — O9989 Other specified diseases and conditions complicating pregnancy, childbirth and the puerperium: Secondary | ICD-10-CM

## 2018-05-17 DIAGNOSIS — O99891 Other specified diseases and conditions complicating pregnancy: Secondary | ICD-10-CM

## 2018-05-17 DIAGNOSIS — R519 Headache, unspecified: Secondary | ICD-10-CM | POA: Diagnosis present

## 2018-05-17 DIAGNOSIS — K219 Gastro-esophageal reflux disease without esophagitis: Secondary | ICD-10-CM | POA: Diagnosis not present

## 2018-05-17 DIAGNOSIS — M549 Dorsalgia, unspecified: Secondary | ICD-10-CM | POA: Diagnosis not present

## 2018-05-17 DIAGNOSIS — O10919 Unspecified pre-existing hypertension complicating pregnancy, unspecified trimester: Secondary | ICD-10-CM

## 2018-05-17 HISTORY — DX: Essential (primary) hypertension: I10

## 2018-05-17 LAB — URINALYSIS, COMPLETE (UACMP) WITH MICROSCOPIC
Bilirubin Urine: NEGATIVE
Glucose, UA: NEGATIVE mg/dL
Hgb urine dipstick: NEGATIVE
Ketones, ur: NEGATIVE mg/dL
Leukocytes, UA: NEGATIVE
Nitrite: NEGATIVE
Protein, ur: NEGATIVE mg/dL
Specific Gravity, Urine: 1.014 (ref 1.005–1.030)
pH: 7 (ref 5.0–8.0)

## 2018-05-17 LAB — CHLAMYDIA/GONOCOCCUS/TRICHOMONAS, NAA
Chlamydia by NAA: NEGATIVE
Gonococcus by NAA: NEGATIVE
Trich vag by NAA: NEGATIVE

## 2018-05-17 MED ORDER — DIPHENHYDRAMINE HCL 25 MG PO CAPS: 50.0000 mg | ORAL_CAPSULE | ORAL | Status: DC

## 2018-05-17 MED ORDER — PROCHLORPERAZINE MALEATE 10 MG PO TABS
10.0000 mg | ORAL_TABLET | ORAL | Status: DC
  Filled 2018-05-17: qty 1

## 2018-05-17 MED ORDER — DIPHENHYDRAMINE HCL 50 MG PO CAPS: 50.0000 mg | ORAL_CAPSULE | Freq: Four times a day (QID) | ORAL | 0 refills | Status: DC | PRN

## 2018-05-17 MED ORDER — PROCHLORPERAZINE MALEATE 10 MG PO TABS: 10.0000 mg | ORAL_TABLET | Freq: Three times a day (TID) | ORAL | 1 refills | Status: DC | PRN

## 2018-05-17 NOTE — Discharge Summary (Signed)
See Final Progress note 

## 2018-05-17 NOTE — OB Triage Note (Signed)
CO back pain since "earlier this week" worsening over the past couple of days. Denies urinary complications. Urine noted to be concentrated. Pain worse with walking/standing. Pain causing difficulty sleeping.  CO HA ongoing. Frontal. Reports floaters at times. Denies blurred vision or epigastric pain.  Denies vaginal bleeding, LOF. Reports good fetal movement. Regina Chandler, Regina Chandler

## 2018-05-17 NOTE — Discharge Summary (Signed)
Physician Final Progress Note  Patient ID: Regina Chandler MRN: [id number] DOB/AGE: [birth year] 27 y.o.  Admit date: 05/17/2018 Admitting provider: Conard NovakStephen D Leonarda Leis, MD Discharge date: 05/17/2018   Admission Diagnoses:  1) intrauterine pregnancy at 2520w6d  2) headache in pregnancy, third trimester 3) back pain in pregnancy, third trimester  Discharge Diagnoses:  1) intrauterine pregnancy at 420w6d  2) headache in pregnancy, third trimester 3) back pain in pregnancy, third trimester  History of Present Illness: The patient is a 27 y.o. female [phone] at 7620w6d who presents for headache and back pain. She has a long history of headache this pregnancy, which does not respond to really any medication. She has tried Tylenol, fioricet, norco, and a combination of compazine and benadryl.  She states that mostly sleep makes her headache better. Her headache has been more persistent this week. So, she has tried sleeping more.  This has made her have more back pain. She has tried a heating pad, but this has not helped.  She denies visual changes, and RUQ pain. She notes +FM, no LOF, no vaginal bleeding, and no contractions.  She denies any focal weakness or numbness, or any other neurologic deficits.     Hospital Course: Patient admitted to observation on L&D.  Her vitals were normal. Her fetal tracing was normal. Her exam was non-focal for her headache. Her back pain is along the iliac crest.  Her headache is essentially unchanged in her pregnancy. Her back pain is newer this week. She has been in bed more this week due to her headache and her trying to sleep more for her headache.   She was given some pregnancy-safe exercises to perform.  Discussed PT. However, she is so close to delivery that waiting may be best.  She will likely need a neurology consult postpartum, if her headache continues. She has no focal signs today.  Her blood pressure is normal.  Continue to monitor and will treat with the same  medication she has been using.   Past Medical History:  Diagnosis Date  . Anemia   . Anxiety   . Bipolar 1 disorder (HCC)   . Depression   . GERD (gastroesophageal reflux disease)   . Headache   . Hepatitis B affecting pregnancy 2007   No current treatment required, titers show up negative  . Hypertension     Past Surgical History:  Procedure Laterality Date  . NO PAST SURGERIES    . NO PAST SURGERIES      No current facility-administered medications on file prior to encounter.    Current Outpatient Medications on File Prior to Encounter  Medication Sig Dispense Refill  . acetaminophen (TYLENOL) 500 MG tablet Take 1,000 mg by mouth every 6 (six) hours as needed for headache.    . Butalbital-APAP-Caffeine 50-325-40 MG capsule Take 1-2 capsules by mouth every 6 (six) hours as needed for headache. 30 capsule 0  . ondansetron (ZOFRAN-ODT) 8 MG disintegrating tablet Take 1 tablet (8 mg total) by mouth every 8 (eight) hours as needed for nausea or vomiting. 30 tablet 2  . Prenatal Vit-Fe Fumarate-FA (PRENATAL MULTIVITAMIN) TABS tablet Take 1 tablet by mouth daily at 12 noon.    . ranitidine (ZANTAC) 150 MG tablet Take 1 tablet (150 mg total) by mouth 2 (two) times daily. 60 tablet 2  . azithromycin (ZITHROMAX) 500 MG tablet Take 2 tablets (1000 mg) x 1 dose, may repeat if necessary (Patient not taking: Reported on 05/17/2018) 4 tablet 0  .  folic acid (FOLVITE) 1 MG tablet Take 1 tablet (1 mg total) by mouth daily. (Patient not taking: Reported on 05/17/2018) 30 tablet 10    Allergies  Allergen Reactions  . Penicillins Swelling    Has patient had a PCN reaction causing immediate rash, facial/tongue/throat swelling, SOB or lightheadedness with hypotension: No Has patient had a PCN reaction causing severe rash involving mucus membranes or skin necrosis: No Has patient had a PCN reaction that required hospitalization No Has patient had a PCN reaction occurring within the last 10 years:  No If all of the above answers are "NO", then may proceed with Cephalosporin use.    Social History   Socioeconomic History  . Marital status: Single    Spouse name: Not on file  . Number of children: Not on file  . Years of education: Not on file  . Highest education level: Not on file  Occupational History  . Not on file  Social Needs  . Financial resource strain: Not on file  . Food insecurity:    Worry: Not on file    Inability: Not on file  . Transportation needs:    Medical: Not on file    Non-medical: Not on file  Tobacco Use  . Smoking status: Former Games developer  . Smokeless tobacco: Never Used  Substance and Sexual Activity  . Alcohol use: No  . Drug use: No  . Sexual activity: Yes    Birth control/protection: Surgical  Lifestyle  . Physical activity:    Days per week: Not on file    Minutes per session: Not on file  . Stress: Not on file  Relationships  . Social connections:    Talks on phone: Not on file    Gets together: Not on file    Attends religious service: Not on file    Active member of club or organization: Not on file    Attends meetings of clubs or organizations: Not on file    Relationship status: Not on file  . Intimate partner violence:    Fear of current or ex partner: Not on file    Emotionally abused: Not on file    Physically abused: Not on file    Forced sexual activity: Not on file  Other Topics Concern  . Not on file  Social History Narrative  . Not on file    Physical Exam: BP 127/70 (BP Location: Left Arm)   Pulse 95   Temp 98.5 F (36.9 C) (Oral)   Resp 16   Ht 5\' 6"  (1.676 m)   Wt (!) 307 lb (139.3 kg)   LMP 09/01/2017 (Exact Date)   BMI 49.55 kg/m   Physical Exam  Constitutional: She is oriented to person, place, and time. She appears well-developed and well-nourished. She does not appear ill.  HENT:  Head: Normocephalic and atraumatic.  Eyes: EOM are normal. No scleral icterus. Left eye exhibits no nystagmus. Pupils  are equal.  Neck: Normal range of motion. Neck supple. No neck rigidity. No tracheal deviation present.  Cardiovascular: Normal rate and regular rhythm.  Pulmonary/Chest: Effort normal and breath sounds normal. No respiratory distress.  Abdominal: Bowel sounds are normal. There is no tenderness.  Gravid, NT Mild ttp along iliac crest bilaterally. No CVAT  Musculoskeletal: Normal range of motion. She exhibits no edema.  Neurological: She is oriented to person, place, and time. She has normal strength. She is not disoriented. She displays normal reflexes. No cranial nerve deficit. Coordination and gait normal.  Skin: Skin is warm and dry.  Psychiatric: She has a normal mood and affect. Her behavior is normal. Her mood appears not anxious. She is not agitated.     Consults: None  Significant Findings/ Diagnostic Studies:  Lab Results  Component Value Date   APPEARANCEUR CLEAR (A) 05/17/2018   GLUCOSEU NEGATIVE 05/17/2018   BILIRUBINUR NEGATIVE 05/17/2018   KETONESUR NEGATIVE 05/17/2018   LABSPEC 1.014 05/17/2018   HGBUR NEGATIVE 05/17/2018   PHURINE 7.0 05/17/2018   NITRITE NEGATIVE 05/17/2018   LEUKOCYTESUR NEGATIVE 05/17/2018   RBCU 0-5 05/17/2018   WBCU 0-5 05/17/2018   BACTERIA RARE (A) 05/17/2018   EPIU 6-10 05/17/2018   MUCOUSUACOMP PRESENT 06/27/2012    Procedures:  NST Baseline FHR: 130 beats/min Variability: moderate Accelerations: present Decelerations: absent Tocometry: rare contraction  Interpretation:  INDICATIONS: back pain, headache RESULTS:  A NST procedure was performed with FHR monitoring and a normal baseline established, appropriate time of 20-40 minutes of evaluation, and accels >2 seen w 15x15 characteristics.  Results show a REACTIVE NST.    Discharge Condition: stable  Disposition: Discharge disposition: 01-Home or Self Care       Diet: Regular diet  Discharge Activity: Activity as tolerated   Allergies as of 05/17/2018      Reactions    Penicillins Swelling   Has patient had a PCN reaction causing immediate rash, facial/tongue/throat swelling, SOB or lightheadedness with hypotension: No Has patient had a PCN reaction causing severe rash involving mucus membranes or skin necrosis: No Has patient had a PCN reaction that required hospitalization No Has patient had a PCN reaction occurring within the last 10 years: No If all of the above answers are "NO", then may proceed with Cephalosporin use.      Medication List    STOP taking these medications   azithromycin 500 MG tablet Commonly known as:  ZITHROMAX   folic acid 1 MG tablet Commonly known as:  FOLVITE     TAKE these medications   acetaminophen 500 MG tablet Commonly known as:  TYLENOL Take 1,000 mg by mouth every 6 (six) hours as needed for headache.   Butalbital-APAP-Caffeine 50-325-40 MG capsule Take 1-2 capsules by mouth every 6 (six) hours as needed for headache.   diphenhydrAMINE 50 MG capsule Commonly known as:  BENADRYL Take 1 capsule (50 mg total) by mouth every 6 (six) hours as needed (headache).   ondansetron 8 MG disintegrating tablet Commonly known as:  ZOFRAN-ODT Take 1 tablet (8 mg total) by mouth every 8 (eight) hours as needed for nausea or vomiting.   prenatal multivitamin Tabs tablet Take 1 tablet by mouth daily at 12 noon.   prochlorperazine 10 MG tablet Commonly known as:  COMPAZINE Take 1 tablet (10 mg total) by mouth every 8 (eight) hours as needed for nausea or vomiting (or headache).   ranitidine 150 MG tablet Commonly known as:  ZANTAC Take 1 tablet (150 mg total) by mouth 2 (two) times daily.        Total time spent taking care of this patient: 30 minutes  Signed: Thomasene Mohair, MD  05/17/2018, 4:05 PM

## 2018-05-19 ENCOUNTER — Telehealth: Payer: Self-pay

## 2018-05-19 LAB — STREP GP B CULTURE+RFLX: Strep Gp B Culture+Rflx: NEGATIVE

## 2018-05-19 NOTE — Telephone Encounter (Signed)
FMLA/DISABILITY form for [employer] filled out, signature obtained, and given to [place] for processing. 

## 2018-05-22 ENCOUNTER — Ambulatory Visit (INDEPENDENT_AMBULATORY_CARE_PROVIDER_SITE_OTHER): Payer: 59

## 2018-05-22 ENCOUNTER — Ambulatory Visit (INDEPENDENT_AMBULATORY_CARE_PROVIDER_SITE_OTHER): Payer: 59 | Admitting: Obstetrics & Gynecology

## 2018-05-22 VITALS — BP 138/82 | Wt 307.0 lb

## 2018-05-22 DIAGNOSIS — O163 Unspecified maternal hypertension, third trimester: Secondary | ICD-10-CM

## 2018-05-22 DIAGNOSIS — Z6841 Body Mass Index (BMI) 40.0 and over, adult: Secondary | ICD-10-CM

## 2018-05-22 DIAGNOSIS — O99213 Obesity complicating pregnancy, third trimester: Secondary | ICD-10-CM | POA: Diagnosis not present

## 2018-05-22 DIAGNOSIS — O0990 Supervision of high risk pregnancy, unspecified, unspecified trimester: Secondary | ICD-10-CM

## 2018-05-22 DIAGNOSIS — O0993 Supervision of high risk pregnancy, unspecified, third trimester: Secondary | ICD-10-CM

## 2018-05-22 DIAGNOSIS — Z3A37 37 weeks gestation of pregnancy: Secondary | ICD-10-CM

## 2018-05-22 DIAGNOSIS — O99210 Obesity complicating pregnancy, unspecified trimester: Secondary | ICD-10-CM

## 2018-05-22 DIAGNOSIS — O10913 Unspecified pre-existing hypertension complicating pregnancy, third trimester: Secondary | ICD-10-CM | POA: Diagnosis not present

## 2018-05-22 DIAGNOSIS — O10919 Unspecified pre-existing hypertension complicating pregnancy, unspecified trimester: Secondary | ICD-10-CM

## 2018-05-22 LAB — FETAL NONSTRESS TEST

## 2018-05-22 NOTE — Progress Notes (Signed)
  Subjective  Fetal Movement? yes Contractions? no Leaking Fluid? no Vaginal Bleeding? no Some pressure reported Objective  BP 138/82   Wt (!) 307 lb (139.3 kg)   LMP 09/01/2017 (Exact Date)   BMI 49.55 kg/m  General: NAD Pumonary: no increased work of breathing Abdomen: gravid, non-tender Extremities: no edema Psychiatric: mood appropriate, affect full Cx 2/50/-3 Assessment  27 y.o. [id number] at 9041w4d by  06/08/2018, by Last Menstrual Period presenting for routine prenatal visit  Plan   Problem List Items Addressed This Visit      Cardiovascular and Mediastinum   Chronic hypertension during pregnancy, antepartum     Other   Supervision of high risk pregnancy, antepartum   BMI 45.0-49.9, adult (HCC)   Obesity in pregnancy, antepartum   Elevated blood pressure affecting pregnancy in third trimester, antepartum    Other Visit Diagnoses    [redacted] weeks gestation of pregnancy    -  Primary    A NST procedure was performed with FHR monitoring and a normal baseline established, appropriate time of 20-40 minutes of evaluation, and accels >2 seen w 15x15 characteristics.  Results show a REACTIVE NST.   Review of ULTRASOUND.    I have personally reviewed images and [facility]    Plan of management to be discussed with patient.  1. APT weekly 2.IOL 40 weeks 3. Monitor for BP sx's and s/sx preeclampsia 4. Labor precautions discussed 5. PNV. ASA. FMC. 6. IOL sch for July 1 at 0500  Annamarie MajorPaul Jasminne Mealy, MD, Merlinda FrederickFACOG [facility], [facility] 05/22/2018  3:07 PM

## 2018-05-29 ENCOUNTER — Other Ambulatory Visit: Payer: Self-pay | Admitting: Obstetrics and Gynecology

## 2018-05-29 ENCOUNTER — Ambulatory Visit (INDEPENDENT_AMBULATORY_CARE_PROVIDER_SITE_OTHER): Payer: 59 | Admitting: Obstetrics and Gynecology

## 2018-05-29 ENCOUNTER — Ambulatory Visit: Payer: 59

## 2018-05-29 ENCOUNTER — Encounter: Payer: Self-pay | Admitting: Obstetrics and Gynecology

## 2018-05-29 VITALS — BP 124/82 | Wt 308.0 lb

## 2018-05-29 DIAGNOSIS — Z6841 Body Mass Index (BMI) 40.0 and over, adult: Secondary | ICD-10-CM

## 2018-05-29 DIAGNOSIS — O98413 Viral hepatitis complicating pregnancy, third trimester: Secondary | ICD-10-CM

## 2018-05-29 DIAGNOSIS — O98419 Viral hepatitis complicating pregnancy, unspecified trimester: Secondary | ICD-10-CM

## 2018-05-29 DIAGNOSIS — A749 Chlamydial infection, unspecified: Secondary | ICD-10-CM | POA: Diagnosis not present

## 2018-05-29 DIAGNOSIS — O99213 Obesity complicating pregnancy, third trimester: Secondary | ICD-10-CM | POA: Diagnosis not present

## 2018-05-29 DIAGNOSIS — O10913 Unspecified pre-existing hypertension complicating pregnancy, third trimester: Secondary | ICD-10-CM | POA: Diagnosis not present

## 2018-05-29 DIAGNOSIS — O0993 Supervision of high risk pregnancy, unspecified, third trimester: Secondary | ICD-10-CM | POA: Diagnosis not present

## 2018-05-29 DIAGNOSIS — R51 Headache: Secondary | ICD-10-CM | POA: Diagnosis not present

## 2018-05-29 DIAGNOSIS — Z3493 Encounter for supervision of normal pregnancy, unspecified, third trimester: Secondary | ICD-10-CM

## 2018-05-29 DIAGNOSIS — O163 Unspecified maternal hypertension, third trimester: Secondary | ICD-10-CM | POA: Diagnosis not present

## 2018-05-29 DIAGNOSIS — R519 Headache, unspecified: Secondary | ICD-10-CM

## 2018-05-29 DIAGNOSIS — B181 Chronic viral hepatitis B without delta-agent: Secondary | ICD-10-CM | POA: Diagnosis not present

## 2018-05-29 DIAGNOSIS — Z3A38 38 weeks gestation of pregnancy: Secondary | ICD-10-CM | POA: Diagnosis not present

## 2018-05-29 DIAGNOSIS — O10919 Unspecified pre-existing hypertension complicating pregnancy, unspecified trimester: Secondary | ICD-10-CM

## 2018-05-29 DIAGNOSIS — O98813 Other maternal infectious and parasitic diseases complicating pregnancy, third trimester: Secondary | ICD-10-CM

## 2018-05-29 DIAGNOSIS — O0990 Supervision of high risk pregnancy, unspecified, unspecified trimester: Secondary | ICD-10-CM

## 2018-05-29 DIAGNOSIS — O26893 Other specified pregnancy related conditions, third trimester: Secondary | ICD-10-CM

## 2018-05-29 DIAGNOSIS — O99210 Obesity complicating pregnancy, unspecified trimester: Secondary | ICD-10-CM

## 2018-05-29 NOTE — Progress Notes (Addendum)
Routine Prenatal Care Visit  Subjective  Regina Chandler is a 27 y.o. [id number] at [redacted]w[redacted]d being seen today for ongoing prenatal care.  She is currently monitored for the following issues for this high-risk pregnancy and has Nausea and vomiting of pregnancy, antepartum; Headache in pregnancy, antepartum, third trimester; Supervision of high risk pregnancy, antepartum; Back pain affecting pregnancy in second trimester; Chronic hepatitis B affecting antepartum care of mother [facility]); BMI 45.0-49.9, adult (HCC); Obesity in pregnancy, antepartum; Chronic hypertension during pregnancy, antepartum; Influenza A; Elevated blood pressure affecting pregnancy in third trimester, antepartum; Varicose veins of legs in pregnancy, third trimester; Chlamydia infection affecting pregnancy; and Labor and delivery, indication for care on their problem list.  ----------------------------------------------------------------------------------- Patient reports backache.   Contractions: Irregular. Vag. Bleeding: None.  Movement: Present. Denies leaking of fluid.  AFI 14.8 cm today, cephalic presentation ----------------------------------------------------------------------------------- The following portions of the patient's history were reviewed and updated as appropriate: allergies, current medications, past family history, past medical history, past social history, past surgical history and problem list. Problem list updated.   Objective  Blood pressure 124/82, weight (!) 308 lb (139.7 kg), last menstrual period 09/01/2017, unknown if currently breastfeeding. Pregravid weight 307 lb (139.3 kg) Total Weight Gain 1 lb (0.454 kg) Urinalysis: Urine Protein: Negative Urine Glucose: Negative  Fetal Status: Fetal Heart Rate (bpm): 135   Movement: Present  Presentation: Vertex  General:  Alert, oriented and cooperative. Patient is in no acute distress.  Skin: Skin is warm and dry. No rash noted.   Cardiovascular: Normal  heart rate noted  Respiratory: Normal respiratory effort, no problems with respiration noted  Abdomen: Soft, gravid, appropriate for gestational age. Pain/Pressure: Present     Pelvic:  Cervical exam performed Dilation: 2 Effacement (%): 50 Station: -3  Extremities: Normal range of motion.     Mental Status: Normal mood and affect. Normal behavior. Normal judgment and thought content.   NST: Baseline FHR: 135 beats/min Variability: moderate Accelerations: present Decelerations: absent Tocometry: not done  Interpretation:  INDICATIONS: obesity in pregnancy, possible history of elevated blood pressure vs hypertension RESULTS:  A NST procedure was performed with FHR monitoring and a normal baseline established, appropriate time of 20-40 minutes of evaluation, and accels >2 seen w 15x15 characteristics.  Results show a REACTIVE NST.    Assessment   27 y.o. A5W0981 at [redacted]w[redacted]d by  06/08/2018, by Last Menstrual Period presenting for routine prenatal visit  Plan   pregnancy #6 Problems (from 12/19/17 to present)    Problem Noted Resolved   Varicose veins of legs in pregnancy, third trimester 04/17/2018 by Farrel Conners, CNM No   Obesity in pregnancy, antepartum 12/19/2017 by Natale Milch, MD No   Overview Addendum 12/19/2017  4:55 PM by Natale Milch, MD    BMI >=40 [ ]  early 1h gtt -  [ ]  u/s for dating [ ]   Arly.Keller ] nutritional goals Arly.Keller ] folic acid 1mg  Arly.Keller ] bASA (>12 weeks) Arly.Keller ] consider nutrition consult [ ]  consider maternal EKG 1st trimester [ ]  Growth u/s 28 [ ] , 32 [ ] , 36 weeks [ ]  [ ]  NST/AFI weekly 36+ weeks (36[] , 37[] , 38[] , 39[] , 40[] ) [ ]  IOL by 41 weeks (scheduled, prn [] )       Chronic hypertension during pregnancy, antepartum 12/19/2017 by Natale Milch, MD No   Overview Signed 12/19/2017  4:52 PM by Natale Milch, MD    Arly.Keller ] Aspirin 81 mg daily after 12 weeks; discontinue after  36 weeks [ ]  baseline labs with CBC, CMP, urine  protein/creatinine ratio [ ]  no BP meds unless BPs become elevated [ ]  ultrasound for growth at 28, 32, 36 weeks [ ]  Aspirin 81 mg daily after 12 weeks; discontinue after 36 weeks [ ]  Baseline EKG   Current antihypertensives:  None   Baseline and surveillance labs ([facility], refresh links as needed)  Lab Results  Component Value Date   PLT 201 12/03/2017   CREATININE 0.47 12/03/2017   AST 18 12/03/2017   ALT 19 12/03/2017   PROTCRRATIO 0.13 11/24/2016    Antenatal Testing CHTN - O10.919  Group I  BP < 140/90, no preeclampsia, AGA,  nml AFV, +/- meds    Group II BP > 140/90, on meds, no preeclampsia, AGA, nml AFV  20-28-34-38  20-24-28-32-35-38  32//2 x wk  28//BPP wkly then 32//2 x wk  40 no meds; 39 meds  PRN or 37  Pre-eclampsia  GHTN - O13.9/Preeclampsia without severe features  - O14.00   Preeclampsia with severe features - O14.10  Q 3-4wks  Q 2 wks  28//BPP wkly then 32//2 x wk  Inpatient  37  PRN or 34         Obesity affecting pregnancy in second trimester 10/18/2016 by Nadara MustardHarris, Robert P, MD 05/15/2018 by Tresea MallGledhill, Jane, CNM       Term labor symptoms and general obstetric precautions including but not limited to vaginal bleeding, contractions, leaking of fluid and fetal movement were reviewed in detail with the patient. Please refer to After Visit Summary for other counseling recommendations.   - discussed IOL on 7/1 at 0500 (already scheduled). She voiced understanding. - routine labor precautions and fetal movement precautions.   Return in about 3 days (around 06/01/2018) for ROB/NST, in 1 week u/s for AFI and ROB/NST.  Thomasene MohairStephen Korin Hartwell, MD, Merlinda FrederickFACOG [facility], [facility] 05/29/2018 2:18 PM

## 2018-05-29 NOTE — Progress Notes (Signed)
NST today. No complaints. 

## 2018-05-29 NOTE — Progress Notes (Incomplete)
Routine Prenatal Care Visit  Subjective  Regina Chandler is a 27 y.o. Z6X0960 at [redacted]w[redacted]d being seen today for ongoing prenatal care.  She is currently monitored for the following issues for this {Blank single:19197::"high-risk","low-risk"} pregnancy and has Nausea and vomiting of pregnancy, antepartum; Headache in pregnancy, antepartum, third trimester; Supervision of high risk pregnancy, antepartum; Back pain affecting pregnancy in second trimester; Chronic hepatitis B affecting antepartum care of mother [facility]); BMI 45.0-49.9, adult (HCC); Obesity in pregnancy, antepartum; Chronic hypertension during pregnancy, antepartum; Influenza A; Elevated blood pressure affecting pregnancy in third trimester, antepartum; Varicose veins of legs in pregnancy, third trimester; Chlamydia infection affecting pregnancy; and Labor and delivery, indication for care on their problem list.  ----------------------------------------------------------------------------------- Patient reports {sx:[id number]}.   Contractions: Irregular. Vag. Bleeding: None.  Movement: Present. Denies leaking of fluid.  ----------------------------------------------------------------------------------- The following portions of the patient's history were reviewed and updated as appropriate: allergies, current medications, past family history, past medical history, past social history, past surgical history and problem list. Problem list updated.   Objective  Blood pressure 124/82, weight (!) 308 lb (139.7 kg), last menstrual period 09/01/2017, unknown if currently breastfeeding. Pregravid weight 307 lb (139.3 kg) Total Weight Gain 1 lb (0.454 kg) Urinalysis: Urine Protein: Negative Urine Glucose: Negative  Fetal Status: Fetal Heart Rate (bpm): 135   Movement: Present  Presentation: Vertex  General:  Alert, oriented and cooperative. Patient is in no acute distress.  Skin: Skin is warm and dry. No rash noted.   Cardiovascular: Normal heart  rate noted  Respiratory: Normal respiratory effort, no problems with respiration noted  Abdomen: Soft, gravid, appropriate for gestational age. Pain/Pressure: Present     Pelvic:  {Blank single:19197::"Cervical exam performed","Cervical exam deferred"} Dilation: 2 Effacement (%): 50 Station: -3  Extremities: Normal range of motion.     Mental Status: Normal mood and affect. Normal behavior. Normal judgment and thought content.   Assessment   27 y.o. A5W0981 at [redacted]w[redacted]d by  06/08/2018, by Last Menstrual Period presenting for {Blank single:19197::"routine","work-in"} prenatal visit  Plan   pregnancy #6 Problems (from 12/19/17 to present)    Problem Noted Resolved   Varicose veins of legs in pregnancy, third trimester 04/17/2018 by Farrel Conners, CNM No   Obesity in pregnancy, antepartum 12/19/2017 by Natale Milch, MD No   Overview Addendum 12/19/2017  4:55 PM by Natale Milch, MD    BMI >=40 [ ]  early 1h gtt -  [ ]  u/s for dating [ ]   Arly.Keller ] nutritional goals Arly.Keller ] folic acid 1mg  Arly.Keller ] bASA (>12 weeks) Arly.Keller ] consider nutrition consult [ ]  consider maternal EKG 1st trimester [ ]  Growth u/s 28 [ ] , 32 [ ] , 36 weeks [ ]  [ ]  NST/AFI weekly 36+ weeks (36[] , 37[] , 38[] , 39[] , 40[] ) [ ]  IOL by 41 weeks (scheduled, prn [] )       Chronic hypertension during pregnancy, antepartum 12/19/2017 by Natale Milch, MD No   Overview Signed 12/19/2017  4:52 PM by Natale Milch, MD    Arly.Keller ] Aspirin 81 mg daily after 12 weeks; discontinue after 36 weeks [ ]  baseline labs with CBC, CMP, urine protein/creatinine ratio [ ]  no BP meds unless BPs become elevated [ ]  ultrasound for growth at 28, 32, 36 weeks [ ]  Aspirin 81 mg daily after 12 weeks; discontinue after 36 weeks [ ]  Baseline EKG   Current antihypertensives:  None   Baseline and surveillance labs (pulled in from [facility] refresh links as needed)  Lab Results  Component Value Date   PLT 201 12/03/2017   CREATININE 0.47  12/03/2017   AST 18 12/03/2017   ALT 19 12/03/2017   PROTCRRATIO 0.13 11/24/2016    Antenatal Testing CHTN - O10.919  Group I  BP < 140/90, no preeclampsia, AGA,  nml AFV, +/- meds    Group II BP > 140/90, on meds, no preeclampsia, AGA, nml AFV  20-28-34-38  20-24-28-32-35-38  32//2 x wk  28//BPP wkly then 32//2 x wk  40 no meds; 39 meds  PRN or 37  Pre-eclampsia  GHTN - O13.9/Preeclampsia without severe features  - O14.00   Preeclampsia with severe features - O14.10  Q 3-4wks  Q 2 wks  28//BPP wkly then 32//2 x wk  Inpatient  37  PRN or 34         Obesity affecting pregnancy in second trimester 10/18/2016 by Nadara MustardHarris, Robert P, MD 05/15/2018 by Tresea MallGledhill, Jane, CNM       {Blank single:19197::"Term","Preterm"} labor symptoms and general obstetric precautions including but not limited to vaginal bleeding, contractions, leaking of fluid and fetal movement were reviewed in detail with the patient. Please refer to After Visit Summary for other counseling recommendations.   Return in about 3 days (around 06/01/2018) for ROB/NST, in 1 week u/s for AFI and ROB/NST.  Thomasene MohairStephen Jackson, MD, Merlinda FrederickFACOG [facility], [facility] 05/29/2018 2:18 PM

## 2018-05-31 ENCOUNTER — Inpatient Hospital Stay: Payer: Managed Care, Other (non HMO) | Admitting: Anesthesiology

## 2018-05-31 ENCOUNTER — Other Ambulatory Visit: Payer: Self-pay

## 2018-05-31 ENCOUNTER — Inpatient Hospital Stay
Admission: EM | Admit: 2018-05-31 | Discharge: 2018-06-01 | DRG: 807 | Disposition: A | Discharge status: Home / Self Care | Payer: Managed Care, Other (non HMO) | Attending: Obstetrics and Gynecology | Admitting: Obstetrics and Gynecology

## 2018-05-31 DIAGNOSIS — Z880 Allergy status to penicillin: Secondary | ICD-10-CM | POA: Diagnosis not present

## 2018-05-31 DIAGNOSIS — O99214 Obesity complicating childbirth: Secondary | ICD-10-CM | POA: Diagnosis present

## 2018-05-31 DIAGNOSIS — Z3A38 38 weeks gestation of pregnancy: Secondary | ICD-10-CM

## 2018-05-31 DIAGNOSIS — O2203 Varicose veins of lower extremity in pregnancy, third trimester: Secondary | ICD-10-CM

## 2018-05-31 DIAGNOSIS — Z87891 Personal history of nicotine dependence: Secondary | ICD-10-CM | POA: Diagnosis not present

## 2018-05-31 DIAGNOSIS — O99210 Obesity complicating pregnancy, unspecified trimester: Secondary | ICD-10-CM

## 2018-05-31 DIAGNOSIS — Z3483 Encounter for supervision of other normal pregnancy, third trimester: Secondary | ICD-10-CM | POA: Diagnosis present

## 2018-05-31 DIAGNOSIS — O1002 Pre-existing essential hypertension complicating childbirth: Secondary | ICD-10-CM | POA: Diagnosis present

## 2018-05-31 DIAGNOSIS — O10919 Unspecified pre-existing hypertension complicating pregnancy, unspecified trimester: Secondary | ICD-10-CM

## 2018-05-31 LAB — CBC
HCT: 31.6 % — ABNORMAL LOW (ref 35.0–47.0)
Hemoglobin: 10.6 g/dL — ABNORMAL LOW (ref 12.0–16.0)
MCH: 28.9 pg (ref 26.0–34.0)
MCHC: 33.4 g/dL (ref 32.0–36.0)
MCV: 86.5 fL (ref 80.0–100.0)
Platelets: 201 10*3/uL (ref 150–440)
RBC: 3.66 MIL/uL — ABNORMAL LOW (ref 3.80–5.20)
RDW: 14.8 % — ABNORMAL HIGH (ref 11.5–14.5)
WBC: 6.8 10*3/uL (ref 3.6–11.0)

## 2018-05-31 LAB — TYPE AND SCREEN
ABO/RH(D): O POS
Antibody Screen: NEGATIVE

## 2018-05-31 MED ORDER — OXYCODONE-ACETAMINOPHEN 5-325 MG PO TABS: 2.0000 | ORAL_TABLET | ORAL | Status: DC | PRN

## 2018-05-31 MED ORDER — ACETAMINOPHEN 325 MG PO TABS
650.0000 mg | ORAL_TABLET | ORAL | Status: DC | PRN
  Administered 2018-05-31: 650 mg via ORAL
  Filled 2018-05-31: qty 2

## 2018-05-31 MED ORDER — OXYTOCIN 40 UNITS IN LACTATED RINGERS INFUSION - SIMPLE MED
2.5000 [IU]/h | INTRAVENOUS | Status: DC
  Filled 2018-05-31: qty 1000

## 2018-05-31 MED ORDER — OXYTOCIN BOLUS FROM INFUSION
500.0000 mL | Freq: Once | INTRAVENOUS | Status: AC
  Administered 2018-05-31: 500 mL via INTRAVENOUS

## 2018-05-31 MED ORDER — ACETAMINOPHEN 325 MG PO TABS: 650.0000 mg | ORAL_TABLET | ORAL | Status: DC | PRN

## 2018-05-31 MED ORDER — EPHEDRINE 5 MG/ML INJ
10.0000 mg | INTRAVENOUS | Status: DC | PRN
  Filled 2018-05-31: qty 2

## 2018-05-31 MED ORDER — TETANUS-DIPHTH-ACELL PERTUSSIS 5-2.5-18.5 LF-MCG/0.5 IM SUSP: 0.5000 mL | Freq: Once | INTRAMUSCULAR | Status: DC

## 2018-05-31 MED ORDER — LACTATED RINGERS IV SOLN: 500.0000 mL | Freq: Once | INTRAVENOUS | Status: DC

## 2018-05-31 MED ORDER — ONDANSETRON HCL 4 MG PO TABS: 4.0000 mg | ORAL_TABLET | ORAL | Status: DC | PRN

## 2018-05-31 MED ORDER — IBUPROFEN 600 MG PO TABS
600.0000 mg | ORAL_TABLET | Freq: Four times a day (QID) | ORAL | Status: DC
  Administered 2018-05-31 – 2018-06-01 (×5): 600 mg via ORAL
  Filled 2018-05-31 (×5): qty 1

## 2018-05-31 MED ORDER — LACTATED RINGERS IV SOLN
INTRAVENOUS | Status: DC
  Administered 2018-05-31 (×2): via INTRAVENOUS

## 2018-05-31 MED ORDER — WITCH HAZEL-GLYCERIN EX PADS: 1.0000 "application " | MEDICATED_PAD | CUTANEOUS | Status: DC | PRN

## 2018-05-31 MED ORDER — SENNOSIDES-DOCUSATE SODIUM 8.6-50 MG PO TABS
2.0000 | ORAL_TABLET | ORAL | Status: DC
  Administered 2018-06-01: 2 via ORAL
  Filled 2018-05-31: qty 2

## 2018-05-31 MED ORDER — LIDOCAINE HCL (PF) 1 % IJ SOLN
INTRAMUSCULAR | Status: DC | PRN
  Administered 2018-05-31: 1 mL via INTRADERMAL

## 2018-05-31 MED ORDER — BENZOCAINE-MENTHOL 20-0.5 % EX AERO
1.0000 "application " | INHALATION_SPRAY | CUTANEOUS | Status: DC | PRN
  Filled 2018-05-31: qty 56

## 2018-05-31 MED ORDER — PHENYLEPHRINE 40 MCG/ML (10ML) SYRINGE FOR IV PUSH (FOR BLOOD PRESSURE SUPPORT)
80.0000 ug | PREFILLED_SYRINGE | INTRAVENOUS | Status: DC | PRN
  Filled 2018-05-31: qty 5

## 2018-05-31 MED ORDER — MISOPROSTOL 200 MCG PO TABS
ORAL_TABLET | ORAL | Status: AC
  Filled 2018-05-31: qty 4

## 2018-05-31 MED ORDER — DIBUCAINE 1 % RE OINT: 1.0000 "application " | TOPICAL_OINTMENT | RECTAL | Status: DC | PRN

## 2018-05-31 MED ORDER — DIPHENHYDRAMINE HCL 25 MG PO CAPS: 25.0000 mg | ORAL_CAPSULE | Freq: Four times a day (QID) | ORAL | Status: DC | PRN

## 2018-05-31 MED ORDER — ONDANSETRON HCL 4 MG/2ML IJ SOLN: 4.0000 mg | INTRAMUSCULAR | Status: DC | PRN

## 2018-05-31 MED ORDER — SODIUM CHLORIDE 0.9 % IV SOLN
INTRAVENOUS | Status: DC | PRN
  Administered 2018-05-31 (×2): 5 mL via EPIDURAL

## 2018-05-31 MED ORDER — FENTANYL 2.5 MCG/ML W/ROPIVACAINE 0.15% IN NS 100 ML EPIDURAL (ARMC)
EPIDURAL | Status: AC
  Filled 2018-05-31: qty 100

## 2018-05-31 MED ORDER — TERBUTALINE SULFATE 1 MG/ML IJ SOLN
0.2500 mg | Freq: Once | INTRAMUSCULAR | Status: AC | PRN
  Administered 2018-05-31: 0.25 mg via SUBCUTANEOUS
  Filled 2018-05-31: qty 1

## 2018-05-31 MED ORDER — OXYTOCIN 40 UNITS IN LACTATED RINGERS INFUSION - SIMPLE MED
1.0000 m[IU]/min | INTRAVENOUS | Status: DC
  Administered 2018-05-31: 1 m[IU]/min via INTRAVENOUS

## 2018-05-31 MED ORDER — SOD CITRATE-CITRIC ACID 500-334 MG/5ML PO SOLN: 30.0000 mL | ORAL | Status: DC | PRN

## 2018-05-31 MED ORDER — OXYCODONE-ACETAMINOPHEN 5-325 MG PO TABS: 1.0000 | ORAL_TABLET | ORAL | Status: DC | PRN

## 2018-05-31 MED ORDER — BUTORPHANOL TARTRATE 1 MG/ML IJ SOLN
1.0000 mg | INTRAMUSCULAR | Status: DC | PRN
  Administered 2018-05-31: 1 mg via INTRAVENOUS
  Filled 2018-05-31: qty 1

## 2018-05-31 MED ORDER — AMMONIA AROMATIC IN INHA
RESPIRATORY_TRACT | Status: AC
  Filled 2018-05-31: qty 10

## 2018-05-31 MED ORDER — ONDANSETRON HCL 4 MG/2ML IJ SOLN: 4.0000 mg | Freq: Four times a day (QID) | INTRAMUSCULAR | Status: DC | PRN

## 2018-05-31 MED ORDER — LIDOCAINE-EPINEPHRINE (PF) 1.5 %-1:200000 IJ SOLN
INTRAMUSCULAR | Status: DC | PRN
  Administered 2018-05-31: 3 mL via EPIDURAL

## 2018-05-31 MED ORDER — COCONUT OIL OIL: 1.0000 "application " | TOPICAL_OIL | Status: DC | PRN

## 2018-05-31 MED ORDER — LIDOCAINE HCL (PF) 1 % IJ SOLN
30.0000 mL | INTRAMUSCULAR | Status: DC | PRN
  Filled 2018-05-31: qty 30

## 2018-05-31 MED ORDER — FENTANYL 2.5 MCG/ML W/ROPIVACAINE 0.15% IN NS 100 ML EPIDURAL (ARMC)
12.0000 mL/h | EPIDURAL | Status: DC
  Administered 2018-05-31: 12 mL/h via EPIDURAL

## 2018-05-31 MED ORDER — SIMETHICONE 80 MG PO CHEW: 80.0000 mg | CHEWABLE_TABLET | ORAL | Status: DC | PRN

## 2018-05-31 MED ORDER — DIPHENHYDRAMINE HCL 50 MG/ML IJ SOLN: 12.5000 mg | INTRAMUSCULAR | Status: DC | PRN

## 2018-05-31 MED ORDER — OXYTOCIN 10 UNIT/ML IJ SOLN
INTRAMUSCULAR | Status: AC
  Filled 2018-05-31: qty 2

## 2018-05-31 MED ORDER — PRENATAL MULTIVITAMIN CH
1.0000 | ORAL_TABLET | Freq: Every day | ORAL | Status: DC
  Administered 2018-05-31 – 2018-06-01 (×2): 1 via ORAL
  Filled 2018-05-31 (×2): qty 1

## 2018-05-31 MED ORDER — LACTATED RINGERS IV SOLN
500.0000 mL | INTRAVENOUS | Status: DC | PRN
  Administered 2018-05-31: 500 mL via INTRAVENOUS

## 2018-05-31 NOTE — Progress Notes (Signed)
   Subjective:  Comfortable with epidural in plae  Objective:   Vitals: Blood pressure (!) 136/94, pulse 83, temperature 98.2 F (36.8 C), temperature source Oral, resp. rate 18, height 5\' 6"  (1.676 m), weight (!) 306 lb (138.8 kg), last menstrual period 09/01/2017, SpO2 99 %, unknown if currently breastfeeding. General: NAD Abdomen:gravid, non-tender, non-distended Cervical Exam:  Dilation: 10 Dilation Complete Date: 05/31/18 Dilation Complete Time: 0708 Effacement (%): 100 Station: -1, 0 Exam by:: A.Rashawd Laskaris, MD  FHT: 120, moderate, +accel, no decels Toco: q25min  Results for orders placed or performed during the hospital encounter of 05/31/18 (from the past 24 hour(s))  CBC     Status: Abnormal   Collection Time: 05/31/18  4:12 AM  Result Value Ref Range   WBC 6.8 3.6 - 11.0 K/uL   RBC 3.66 (L) 3.80 - 5.20 MIL/uL   Hemoglobin 10.6 (L) 12.0 - 16.0 g/dL   HCT 16.131.6 (L) 09.635.0 - 04.547.0 %   MCV 86.5 80.0 - 100.0 fL   MCH 28.9 26.0 - 34.0 pg   MCHC 33.4 32.0 - 36.0 g/dL   RDW 40.914.8 (H) 81.111.5 - 91.414.5 %   Platelets 201 150 - 440 K/uL  Type and screen [facility]     Status: None   Collection Time: 05/31/18  4:12 AM  Result Value Ref Range   ABO/RH(D) O POS    Antibody Screen NEG    Sample Expiration      06/03/2018 Performed at [facility], [address] [place], [place] [address]     Assessment:   27 y.o. [id number] 3038w6d   Plan:   1) Labor - AROM clear, had about 2 cm of cervix reform after AROM will allow to labor down some more and cervix to fully dilated again before pushing  2) Fetus - cat I tracing  Vena AustriaAndreas Slayter Moorhouse, MD, Merlinda FrederickFACOG [facility], [facility] 05/31/2018, 7:18 AM

## 2018-05-31 NOTE — OB Triage Note (Signed)
Pt seen in triage w/ c/o of ctx and vaginal bleeding. Pt states ctx started yesterday around 1500 and progressively gotten worst. Pt states her pain is a 10/10. Pt states pain is located in lower abdomen. Pt states her vaginal bleeding just started and blood is "bright red". Pt denies LOF and states positive fetal movement. Monitors applied and assessing.

## 2018-05-31 NOTE — Plan of Care (Signed)
Spontaneous vaginal delivery of viable female infant.  Patient vitals WNL, bonding well with infant.

## 2018-05-31 NOTE — H&P (Signed)
Obstetric H&P   Chief Complaint: Contractions  Prenatal Care Provider: [facility]  History of Present Illness: 27 y.o. [id number] [redacted]w[redacted]d by 06/08/2018, by Last Menstrual Period = 13 week [place] presenting to L&D with contractions.  Change from 3.5 to 5cm in initial hour of evaluation.  +FM, no LOF, some blood show.  Pregnancy notable for obesity, HBV+, and chlamydia.     Pregravid weight 307 lb (139.3 kg) Total Weight Gain -1 lb (-0.454 kg)   Pelvis tested to 7lbs 10oz, growth at 36 weeks 6lbs 3oz, 2794g c/w 36%ile  pregnancy #6 Problems (from 12/19/17 to present)    Problem Noted Resolved   Varicose veins of legs in pregnancy, third trimester 04/17/2018 by Farrel Conners, CNM No   Supervision of high risk pregnancy, antepartum 12/19/2017 by Natale Milch, MD No   Overview Addendum 05/31/2018  4:00 AM by Vena Austria, MD      Clinic [facility]  Dating  LMP = 13 week [place] Blood type: --/--/O POS (04/26 1328)   Genetic Screen Not obtained Antibody:NEG (04/26 1328)  Anatomic [place] Normal female Rubella: 4.76 (03/11 1203) Varicella:Immune  GTT 99  RPR: Non Reactive (04/08 0923)   Rhogam N/A HBsAg: Confirm. indicated (03/11 1203)   TDaP vaccine  04/03/18                 HIV: Non Reactive (04/08 0923)   Flu Shot  Declined                             AVW:[id number]  Contraception BTL, 30 day papers signed 5/10 NWG:[id number] need repeat   CBB   Chlamydia positive 12/19/2016  CS/VBAC N/A   Baby Food Br/Bt   Support Person             Obesity in pregnancy, antepartum 12/19/2017 by Natale Milch, MD No   Overview Addendum 12/19/2017  4:55 PM by Natale Milch, MD    BMI >=40 [ ]  early 1h gtt -  [ ]  u/s for dating [ ]   Arly.Keller ] nutritional goals Arly.Keller ] folic acid 1mg  Arly.Keller ] bASA (>12 weeks) Arly.Keller ] consider nutrition consult [ ]  consider maternal EKG 1st trimester [ ]  Growth u/s 28 [ ] , 32 [ ] , 36 weeks [ ]  [ ]  NST/AFI weekly 36+ weeks (36[] , 37[] , 38[] , 39[] , 40[] ) [ ]   IOL by 41 weeks (scheduled, prn [] )       Chronic hypertension during pregnancy, antepartum 12/19/2017 by Natale Milch, MD No   Overview Signed 12/19/2017  4:52 PM by Natale Milch, MD    Arly.Keller ] Aspirin 81 mg daily after 12 weeks; discontinue after 36 weeks [ ]  baseline labs with CBC, CMP, urine protein/creatinine ratio [ ]  no BP meds unless BPs become elevated [ ]  ultrasound for growth at 28, 32, 36 weeks [ ]  Aspirin 81 mg daily after 12 weeks; discontinue after 36 weeks [ ]  Baseline EKG   Current antihypertensives:  None   Baseline and surveillance labs (pulled in from [facility], refresh links as needed)  Lab Results  Component Value Date   PLT 201 12/03/2017   CREATININE 0.47 12/03/2017   AST 18 12/03/2017   ALT 19 12/03/2017   PROTCRRATIO 0.13 11/24/2016    Antenatal Testing CHTN - O10.919  Group I  BP < 140/90, no preeclampsia, AGA,  nml AFV, +/- meds    Group II BP > 140/90, on  meds, no preeclampsia, AGA, nml AFV  20-28-34-38  20-24-28-32-35-38  32//2 x wk  28//BPP wkly then 32//2 x wk  40 no meds; 39 meds  PRN or 37  Pre-eclampsia  GHTN - O13.9/Preeclampsia without severe features  - O14.00   Preeclampsia with severe features - O14.10  Q 3-4wks  Q 2 wks  28//BPP wkly then 32//2 x wk  Inpatient  37  PRN or 34         Obesity affecting pregnancy in second trimester 10/18/2016 by Nadara MustardHarris, Robert P, MD 05/15/2018 by Tresea MallGledhill, Jane, CNM        Review of Systems: 10 point review of systems negative unless otherwise noted in HPI  Past Medical History: Past Medical History:  Diagnosis Date  . Anemia   . Anxiety   . Bipolar 1 disorder (HCC)   . Depression   . GERD (gastroesophageal reflux disease)   . Headache   . Hepatitis B affecting pregnancy 2007   No current treatment required, titers show up negative  . Hypertension     Past Surgical History: Past Surgical History:  Procedure Laterality Date  . NO PAST SURGERIES    . NO PAST  SURGERIES      Past Obstetric History: #: 1, Date: 2006, Sex: None, Weight: None, GA: None, Delivery: None, Apgar1: None, Apgar5: None, Living: None, Birth Comments: None  #: 2, Date: 2010, Sex: None, Weight: None, GA: None, Delivery: None, Apgar1: None, Apgar5: None, Living: None, Birth Comments: None  #: 3, Date: 01/10/11, Sex: Female, Weight: 7 lb 10 oz (3.459 kg), GA: 4589w0d, Delivery: Vaginal, Spontaneous, Apgar1: None, Apgar5: None, Living: Living, Birth Comments: None  #: 4, Date: 03/08/12, Sex: Female, Weight: 6 lb (2.722 kg), GA: 3370w3d, Delivery: Vaginal, Spontaneous, Apgar1: None, Apgar5: None, Living: Living, Birth Comments: None  #: 5, Date: 11/25/16, Sex: Female, Weight: 6 lb 14.8 oz (3.14 kg), GA: 672w0d, Delivery: Vaginal, Spontaneous, Apgar1: 9, Apgar5: 9, Living: Living, Birth Comments: None  #: 6, Date: None, Sex: None, Weight: None, GA: None, Delivery: None, Apgar1: None, Apgar5: None, Living: None, Birth Comments: None   Past Gynecologic History:  Family History: Family History  Problem Relation Age of Onset  . Cancer Neg Hx   . Diabetes Neg Hx   . Heart disease Neg Hx   . Stroke Neg Hx     Social History: Social History   Socioeconomic History  . Marital status: Single    Spouse name: Not on file  . Number of children: Not on file  . Years of education: Not on file  . Highest education level: Not on file  Occupational History  . Not on file  Social Needs  . Financial resource strain: Not on file  . Food insecurity:    Worry: Not on file    Inability: Not on file  . Transportation needs:    Medical: Not on file    Non-medical: Not on file  Tobacco Use  . Smoking status: Former Games developermoker  . Smokeless tobacco: Never Used  Substance and Sexual Activity  . Alcohol use: No  . Drug use: No  . Sexual activity: Yes    Birth control/protection: Surgical    Comment: Tubal ligation  Lifestyle  . Physical activity:    Days per week: Not on file    Minutes  per session: Not on file  . Stress: Not on file  Relationships  . Social connections:    Talks on phone: Not on file  Gets together: Not on file    Attends religious service: Not on file    Active member of club or organization: Not on file    Attends meetings of clubs or organizations: Not on file    Relationship status: Not on file  . Intimate partner violence:    Fear of current or ex partner: Not on file    Emotionally abused: Not on file    Physically abused: Not on file    Forced sexual activity: Not on file  Other Topics Concern  . Not on file  Social History Narrative  . Not on file    Medications: Prior to Admission medications   Medication Sig Start Date End Date Taking? Authorizing Provider  Prenatal Vit-Fe Fumarate-FA (PRENATAL MULTIVITAMIN) TABS tablet Take 1 tablet by mouth daily at 12 noon.   Yes [provider]  acetaminophen (TYLENOL) 500 MG tablet Take 1,000 mg by mouth every 6 (six) hours as needed for headache.    [provider]  Butalbital-APAP-Caffeine [phone] MG capsule Take 1-2 capsules by mouth every 6 (six) hours as needed for headache. Patient not taking: Reported on 05/29/2018 03/16/18   Oswaldo Conroy, CNM  diphenhydrAMINE (BENADRYL) 50 MG capsule Take 1 capsule (50 mg total) by mouth every 6 (six) hours as needed (headache). Patient not taking: Reported on 05/29/2018 05/17/18   Conard Novak, MD  ondansetron (ZOFRAN-ODT) 8 MG disintegrating tablet Take 1 tablet (8 mg total) by mouth every 8 (eight) hours as needed for nausea or vomiting. Patient not taking: Reported on 05/29/2018 02/16/18   Farrel Conners, CNM  prochlorperazine (COMPAZINE) 10 MG tablet Take 1 tablet (10 mg total) by mouth every 8 (eight) hours as needed for nausea or vomiting (or headache). Patient not taking: Reported on 05/29/2018 05/17/18   Conard Novak, MD  ranitidine (ZANTAC) 150 MG tablet Take 1 tablet (150 mg total) by mouth 2 (two) times  daily. Patient not taking: Reported on 05/29/2018 02/16/18   Farrel Conners, CNM    Allergies: Allergies  Allergen Reactions  . Penicillins Swelling    Has patient had a PCN reaction causing immediate rash, facial/tongue/throat swelling, SOB or lightheadedness with hypotension: No Has patient had a PCN reaction causing severe rash involving mucus membranes or skin necrosis: No Has patient had a PCN reaction that required hospitalization No Has patient had a PCN reaction occurring within the last 10 years: No If all of the above answers are "NO", then may proceed with Cephalosporin use.    Physical Exam: Vitals: Blood pressure 129/86, pulse 78, temperature 98.4 F (36.9 C), temperature source Oral, resp. rate 20, height 5\' 6"  (1.676 m), weight (!) 306 lb (138.8 kg), last menstrual period 09/01/2017, unknown if currently breastfeeding.  FHT: 120, moderate, +accels, no decels Toco: q3-11min  General: NAD HEENT: normocephalic, anicteric Pulmonary: No increased work of breathing Cardiovascular: RRR, distal pulses 2+ Abdomen: Gravid, non-tender Genitourinary: Dilation: 5 Effacement (%): 70 Station: -3 Exam by:: D. Corse RN; Hurshel Party RN  Extremities: no edema, erythema, or tenderness Neurologic: Grossly intact Psychiatric: mood appropriate, affect full  Labs: No results found for this or any previous visit (from the past 24 hour(s)).  Assessment: 27 y.o. [id number] [redacted]w[redacted]d by 06/08/2018, by Last Menstrual Period = 13 week [place] presenting in term labor  Plan: 1) Labor - expectant management  2) Fetus - cat I tracing  3) PNL - Blood type --/--/O POS (04/26 1328) / Anti-bodyscreen NEG (04/26 1328) / Rubella 4.76 (03/11  1203) / Varicella Immune / RPR Non Reactive (04/08 0923) / HBsAg Confirm. indicated (03/11 1203) / HIV Non Reactive (04/08 0923) / 1-hr OGTT 99 / GBS negative  4) Immunization History -  Immunization History  Administered Date(s) Administered  . Tdap 04/03/2018     5) Disposition - pending delivery  Vena Austria, MD, [facility], [facility] 05/31/2018, 3:50 AM

## 2018-05-31 NOTE — Anesthesia Preprocedure Evaluation (Signed)
Anesthesia Evaluation  Patient identified by MRN, date of birth, ID band Patient awake    Reviewed: Allergy & Precautions, H&P , NPO status , Patient's Chart, lab work & pertinent test results  History of Anesthesia Complications (+) history of anesthetic complications (one sided epidural)  Airway Mallampati: III  TM Distance: >3 FB Neck ROM: full    Dental  (+) Chipped   Pulmonary former smoker,           Cardiovascular Exercise Tolerance: Good hypertension, + Peripheral Vascular Disease       Neuro/Psych  Headaches, PSYCHIATRIC DISORDERS Anxiety Depression Bipolar Disorder    GI/Hepatic GERD  Medicated and Controlled,(+) Hepatitis -, B  Endo/Other  Morbid obesity  Renal/GU      Musculoskeletal   Abdominal   Peds  Hematology negative hematology ROS (+)   Anesthesia Other Findings Past Medical History: No date: Anemia No date: Anxiety No date: Bipolar 1 disorder (HCC) No date: Depression No date: GERD (gastroesophageal reflux disease) No date: Headache 2007: Hepatitis B affecting pregnancy     Comment:  No current treatment required, titers show up negative No date: Hypertension  Past Surgical History: No date: NO PAST SURGERIES No date: NO PAST SURGERIES  BMI    Body Mass Index:  49.39 kg/m      Reproductive/Obstetrics (+) Pregnancy                             Anesthesia Physical Anesthesia Plan  ASA: III  Anesthesia Plan: Epidural   Post-op Pain Management:    Induction:   PONV Risk Score and Plan:   Airway Management Planned:   Additional Equipment:   Intra-op Plan:   Post-operative Plan:   Informed Consent: I have reviewed the patients History and Physical, chart, labs and discussed the procedure including the risks, benefits and alternatives for the proposed anesthesia with the patient or authorized representative who has indicated his/her understanding  and acceptance.     Plan Discussed with: Anesthesiologist  Anesthesia Plan Comments:         Anesthesia Quick Evaluation

## 2018-05-31 NOTE — Anesthesia Procedure Notes (Signed)
Epidural Patient location during procedure: OB Start time: 05/31/2018 5:12 AM End time: 05/31/2018 5:16 AM  Staffing Anesthesiologist: Kerry-Anne Mezo, Cleda MccreedyJoseph K, MD Performed: anesthesiologist   Preanesthetic Checklist Completed: patient identified, site marked, surgical consent, pre-op evaluation, timeout performed, IV checked, risks and benefits discussed and monitors and equipment checked  Epidural Patient position: sitting Prep: ChloraPrep Patient monitoring: heart rate, continuous pulse ox and blood pressure Approach: midline Location: L3-L4 Injection technique: LOR saline  Needle:  Needle type: Tuohy  Needle gauge: 17 G Needle length: 9 cm and 9 Needle insertion depth: 7 cm Catheter type: closed end flexible Catheter size: 19 Gauge Catheter at skin depth: 13 cm Test dose: negative and 1.5% lidocaine with Epi 1:200 K  Assessment Sensory level: T10 Events: blood not aspirated, injection not painful, no injection resistance, negative IV test and no paresthesia  Additional Notes 1 attempt Pt. Evaluated and documentation done after procedure finished. Patient identified. Risks/Benefits/Options discussed with patient including but not limited to bleeding, infection, nerve damage, paralysis, failed block, incomplete pain control, headache, blood pressure changes, nausea, vomiting, reactions to medication both or allergic, itching and postpartum back pain. Confirmed with bedside nurse the patient's most recent platelet count. Confirmed with patient that they are not currently taking any anticoagulation, have any bleeding history or any family history of bleeding disorders. Patient expressed understanding and wished to proceed. All questions were answered. Sterile technique was used throughout the entire procedure. Please see nursing notes for vital signs. Test dose was given through epidural catheter and negative prior to continuing to dose epidural or start infusion. Warning signs of  high block given to the patient including shortness of breath, tingling/numbness in hands, complete motor block, or any concerning symptoms with instructions to call for help. Patient was given instructions on fall risk and not to get out of bed. All questions and concerns addressed with instructions to call with any issues or inadequate analgesia.   Patient tolerated the insertion well without immediate complications.Reason for block:procedure for pain

## 2018-06-01 LAB — CBC
HCT: 29 % — ABNORMAL LOW (ref 35.0–47.0)
Hemoglobin: 9.7 g/dL — ABNORMAL LOW (ref 12.0–16.0)
MCH: 29 pg (ref 26.0–34.0)
MCHC: 33.4 g/dL (ref 32.0–36.0)
MCV: 86.9 fL (ref 80.0–100.0)
Platelets: 189 10*3/uL (ref 150–440)
RBC: 3.34 MIL/uL — ABNORMAL LOW (ref 3.80–5.20)
RDW: 14.8 % — ABNORMAL HIGH (ref 11.5–14.5)
WBC: 7.8 10*3/uL (ref 3.6–11.0)

## 2018-06-01 NOTE — Progress Notes (Signed)
Dc to home to car via staff

## 2018-06-01 NOTE — Progress Notes (Signed)
Dc inst reviewed with pt.  Verb u/o of f/u.

## 2018-06-01 NOTE — Discharge Summary (Signed)
OB Discharge Summary     Patient Name: Regina Chandler DOB: 1991-06-25 MRN: [id number]  Date of admission: 05/31/2018 Delivering Provider: Doreene Burke, CNM for Dr.Staebler (who was in the OR). Date of Delivery: 05/31/2018  Date of discharge: 06/01/2018  Admitting diagnosis: 39 wks preg contractions and bleeding Intrauterine pregnancy: [redacted]w[redacted]d     Secondary diagnosis: Chronic Hypertension     Discharge diagnosis: Term Pregnancy Delivered, No other diagnosis                         Hospital course:  Onset of Labor With Vaginal Delivery     27 y.o. yo [id number] at [redacted]w[redacted]d was admitted in Active Labor on 05/31/2018. Patient had an uncomplicated labor course as follows:  Membrane Rupture Time/Date: 7:11 AM ,05/31/2018   Intrapartum Procedures: Episiotomy: None [1]                                         Lacerations:  None [1]  Patient had a delivery of a Viable infant. 05/31/2018  Information for the patient's newborn:  Regina Chandler [[id number]]  Delivery Method: Vag-Spont    Pateint had an uncomplicated postpartum course.  She is ambulating, tolerating a regular diet, passing flatus, and urinating well. Patient is discharged home in stable condition on 06/01/18.                                                                  Post partum procedures: none  Complications: None  Physical exam on 06/01/2018: Vitals:   05/31/18 1925 05/31/18 2325 06/01/18 0341 06/01/18 0856  BP: 133/78 135/84 126/87 140/88  Pulse: 81 85 76 84  Resp: 20 18 16 18   Temp: 97.8 F (36.6 C) 98.3 F (36.8 C) 98.2 F (36.8 C) 98 F (36.7 C)  TempSrc: Oral Oral Oral Oral  SpO2: 100% 99% 100% 99%  Weight:      Height:       General: alert, cooperative and no distress Lochia: appropriate Uterine Fundus: firm Incision: N/A DVT Evaluation: No evidence of DVT seen on physical exam.  Labs: Lab Results  Component Value Date   WBC 7.8 06/01/2018   HGB 9.7 (L) 06/01/2018   HCT 29.0 (L) 06/01/2018    MCV 86.9 06/01/2018   PLT 189 06/01/2018   CMP Latest Ref Rng & Units 04/03/2018  Glucose 65 - 99 mg/dL 130(Q)  BUN 6 - 20 mg/dL 7  Creatinine 6.57 - 8.46 mg/dL 9.62  Sodium 952 - 841 mmol/L 135  Potassium 3.5 - 5.1 mmol/L 3.4(L)  Chloride 101 - 111 mmol/L 105  CO2 22 - 32 mmol/L 24  Calcium 8.9 - 10.3 mg/dL 8.1(L)  Total Protein 6.5 - 8.1 g/dL 6.8  Total Bilirubin 0.3 - 1.2 mg/dL 0.6  Alkaline Phos 38 - 126 U/L 80  AST 15 - 41 U/L 21  ALT 14 - 54 U/L 20    Discharge instruction: per After Visit Summary.  Medications:  Allergies as of 06/01/2018      Reactions   Penicillins Swelling   Has patient had a PCN reaction causing immediate rash, facial/tongue/throat swelling, SOB or lightheadedness  with hypotension: No Has patient had a PCN reaction causing severe rash involving mucus membranes or skin necrosis: No Has patient had a PCN reaction that required hospitalization No Has patient had a PCN reaction occurring within the last 10 years: No If all of the above answers are "NO", then may proceed with Cephalosporin use.      Medication List    STOP taking these medications   acetaminophen 500 MG tablet Commonly known as:  TYLENOL   Butalbital-APAP-Caffeine 50-325-40 MG capsule   diphenhydrAMINE 50 MG capsule Commonly known as:  BENADRYL   ondansetron 8 MG disintegrating tablet Commonly known as:  ZOFRAN-ODT   prochlorperazine 10 MG tablet Commonly known as:  COMPAZINE   ranitidine 150 MG tablet Commonly known as:  ZANTAC     TAKE these medications   prenatal multivitamin Tabs tablet Take 1 tablet by mouth daily at 12 noon.       Diet: routine diet  Activity: Advance as tolerated. Pelvic rest for 6 weeks.   Outpatient follow up: Follow-up Information    Vena AustriaStaebler, Andreas, MD. Schedule an appointment as soon as possible for a visit in 4 week(s).   Specialty:  Obstetrics and Gynecology Contact information: [address] [place]  [phone]            Postpartum contraception: Tubal Ligation, Interval Rhogam Given postpartum: no Rubella vaccine given postpartum: no Varicella vaccine given postpartum: no TDaP given antepartum or postpartum: Yes, 04/03/2018  Newborn Data: Live born female  Birth Weight: 6 lb 10.9 oz (3030 g) APGAR: 9, 9  Newborn Delivery   Birth date/time:  05/31/2018 10:18:00 Delivery type:  Vaginal, Spontaneous    Baby Feeding: Breast and formula  Disposition: home with mother  SIGNED:  Oswaldo ConroyJacelyn Y Chandler, CNM 06/01/2018 9:10 AM

## 2018-06-01 NOTE — Anesthesia Postprocedure Evaluation (Signed)
Anesthesia Post Note  Patient: Regina Chandler  Procedure(s) Performed: AN AD HOC LABOR EPIDURAL  Patient location during evaluation: Mother Baby Anesthesia Type: Epidural Level of consciousness: awake and awake and alert Pain management: pain level controlled Vital Signs Assessment: post-procedure vital signs reviewed and stable Respiratory status: spontaneous breathing Cardiovascular status: blood pressure returned to baseline Postop Assessment: no headache and no backache Anesthetic complications: no     Last Vitals:  Vitals:   05/31/18 2325 06/01/18 0341  BP: 135/84 126/87  Pulse: 85 76  Resp: 18 16  Temp: 36.8 C 36.8 C  SpO2: 99% 100%    Last Pain:  Vitals:   06/01/18 0615  TempSrc:   PainSc: 0-No pain                 Lynzee Lindquist Lawerance CruelStarr

## 2018-06-01 NOTE — Plan of Care (Signed)
VSS. Alert and oriented with pleasant affect. Color good, skin w&d. Fundus Firm at U/E. Lochia scant to small in amount. Has voided and showered. V/O of initial teaching, Consulting civil engineerafety and Security. Demonstrating aprop. Care of self.

## 2018-06-02 ENCOUNTER — Encounter: Payer: 59 | Admitting: Obstetrics and Gynecology

## 2018-06-02 LAB — RPR: RPR Ser Ql: NONREACTIVE

## 2018-06-05 ENCOUNTER — Other Ambulatory Visit: Payer: 59

## 2018-06-05 ENCOUNTER — Encounter: Payer: 59 | Admitting: Maternal Newborn

## 2018-06-30 ENCOUNTER — Telehealth: Payer: Self-pay

## 2018-06-30 NOTE — Telephone Encounter (Signed)
FMLA/DISABILITY form for [employer] filled out, signature obtained, and given to [place] for processing.

## 2018-07-03 ENCOUNTER — Telehealth: Payer: Self-pay

## 2018-07-03 NOTE — Telephone Encounter (Signed)
Additional information [facility] and given to [place] for processing.

## 2018-07-10 IMAGING — US US OB COMP LESS 14 WK
1 series · 14 of 28 positions shown · non-contrast
Comparison: None.

CLINICAL DATA: Cramping and vaginal spotting

EXAM:
OBSTETRIC <14 WK ULTRASOUND
TECHNIQUE: Transabdominal ultrasound was performed for evaluation of the
gestation as well as the maternal uterus and adnexal regions.

[Series 1: us ob comp less 14 wk · 0.20mm/px · 14 of 80 slices shown]
[im 3/80]
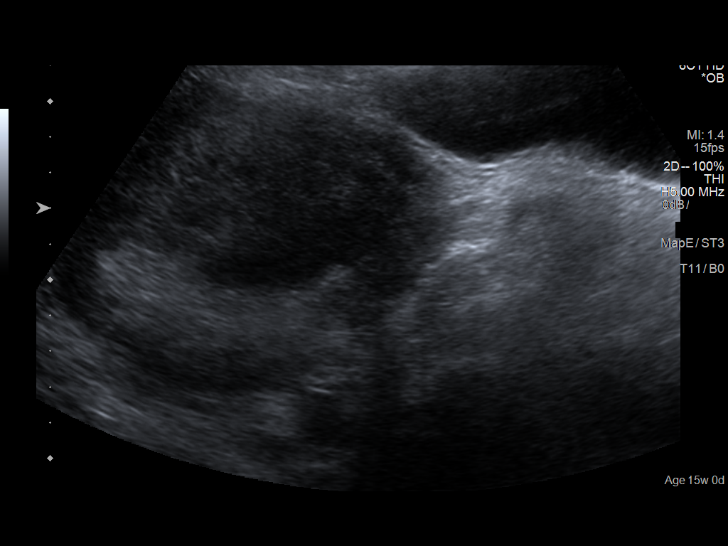
[im 9/80]
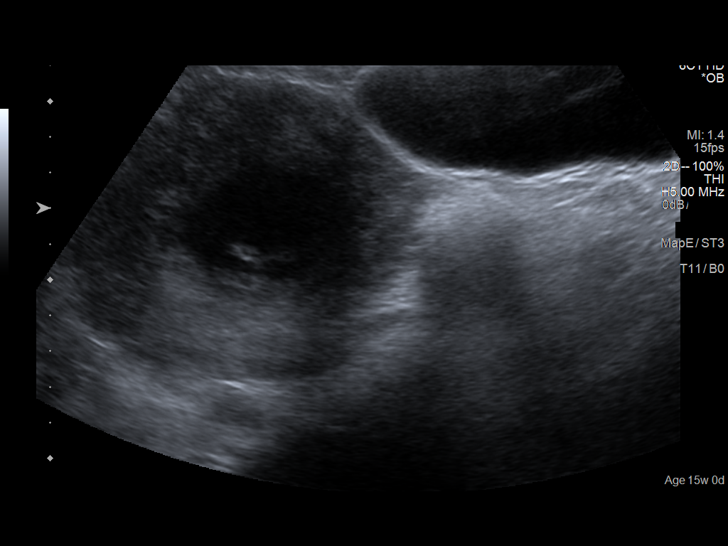
[im 15/80]
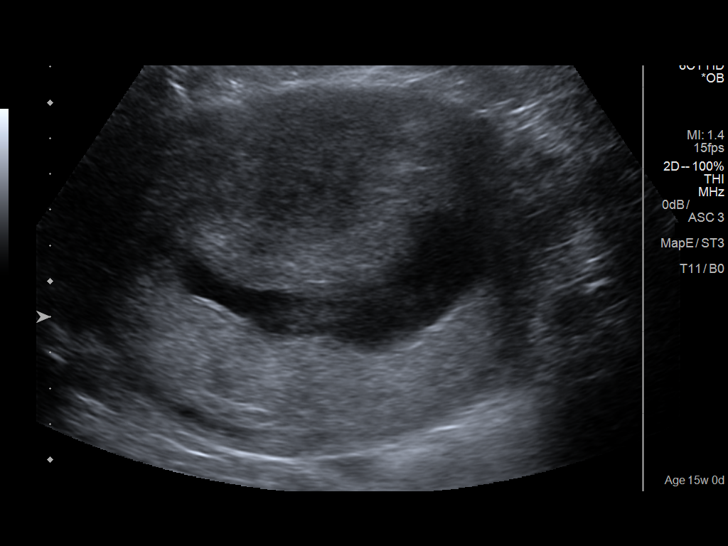
[im 21/80]
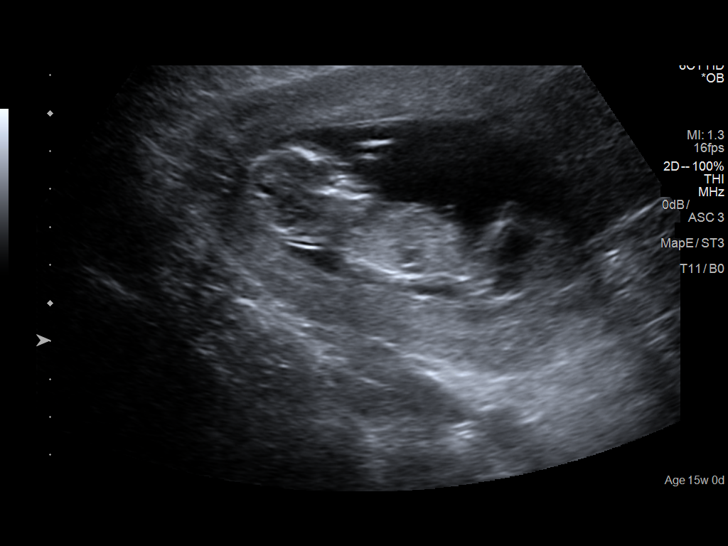
[im 27/80]
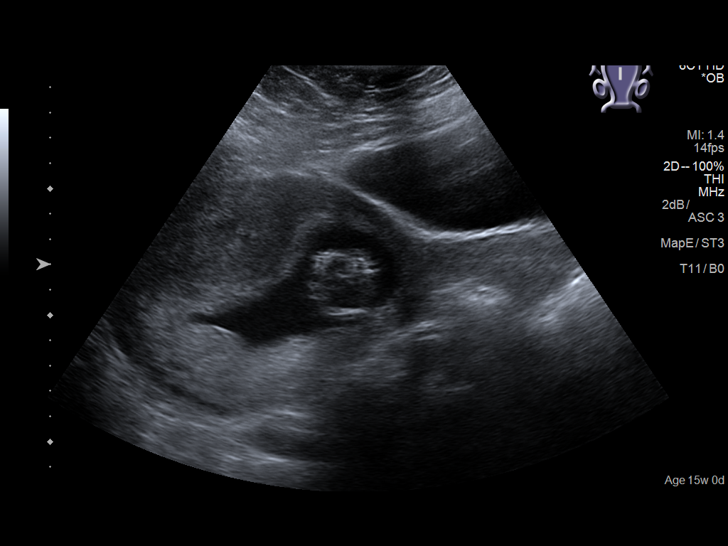
[im 33/80]
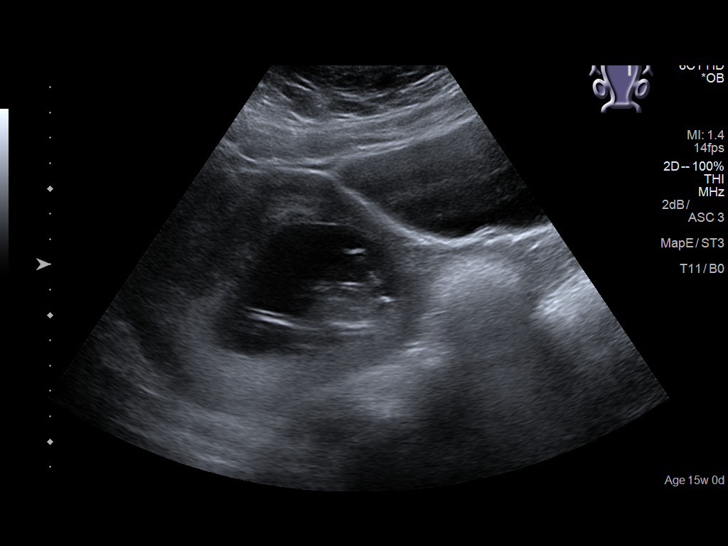
[im 39/80]
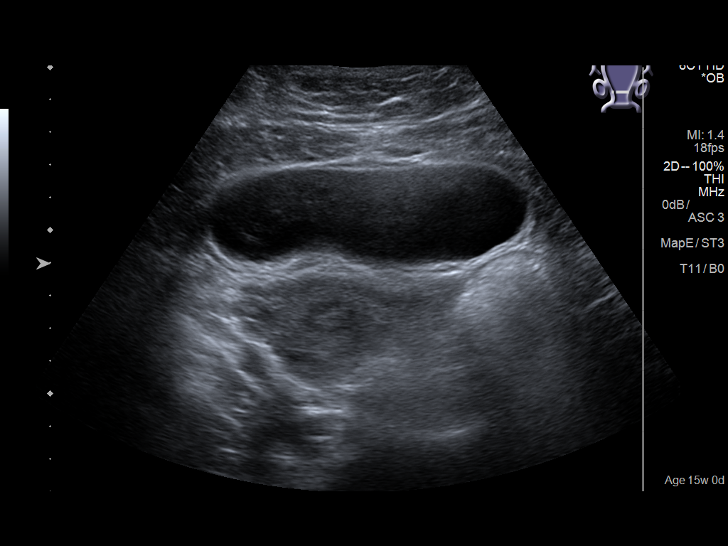
[im 44/80]
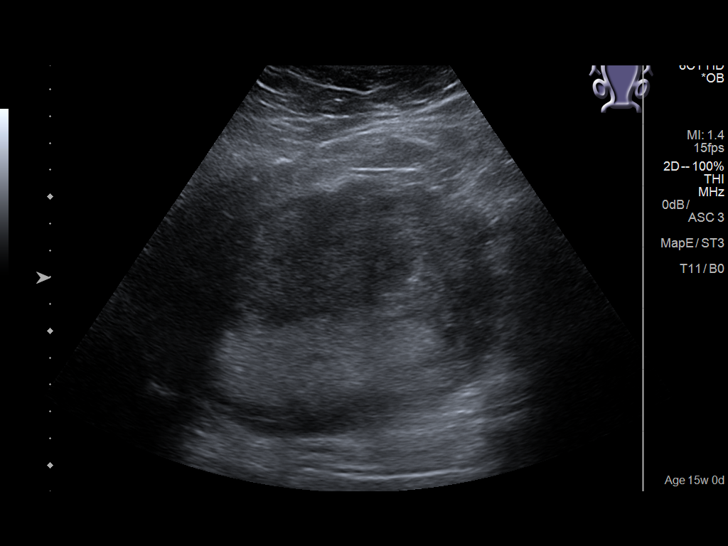
[im 50/80]
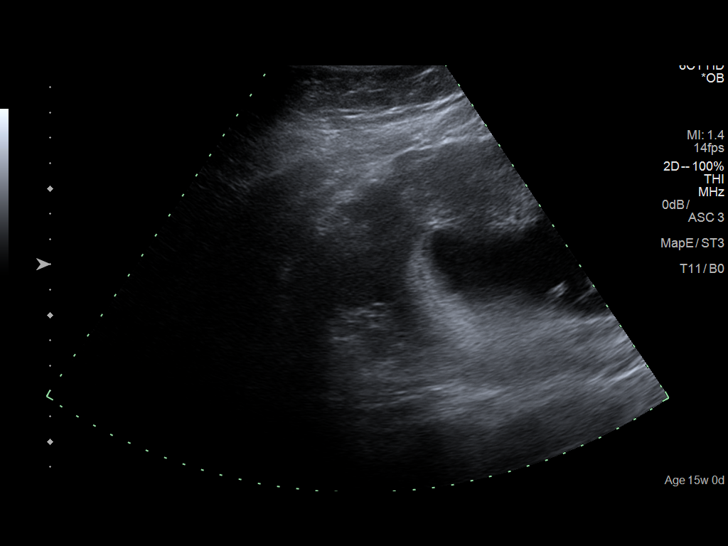
[im 56/80]
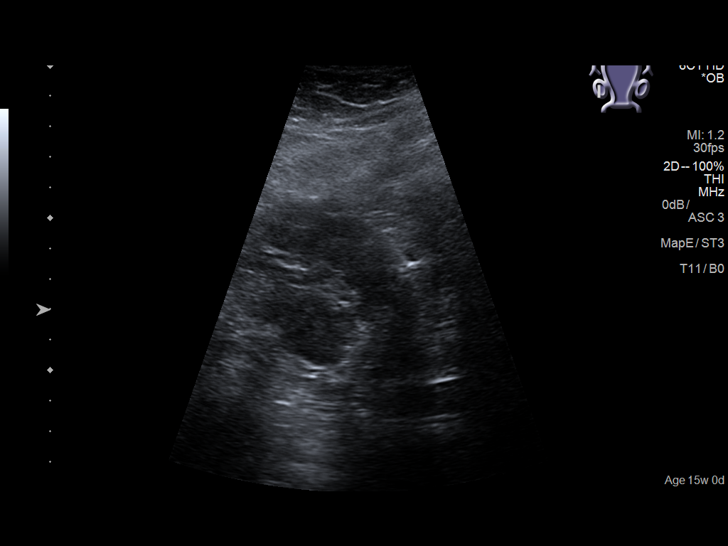
[im 62/80]
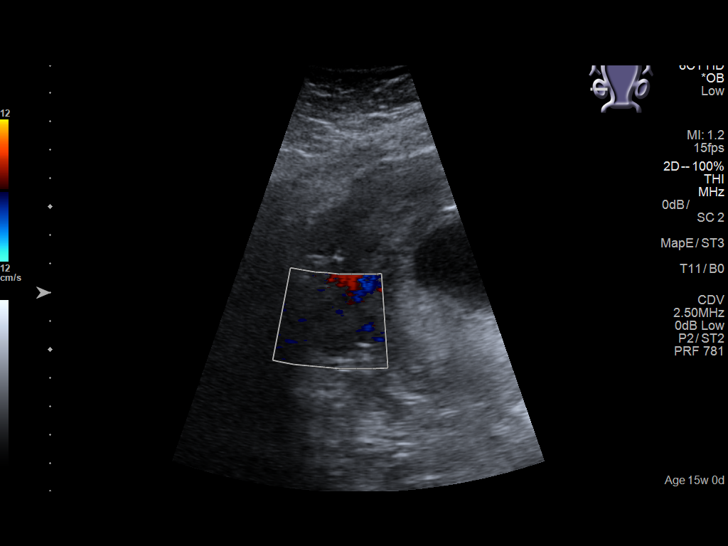
[im 68/80]
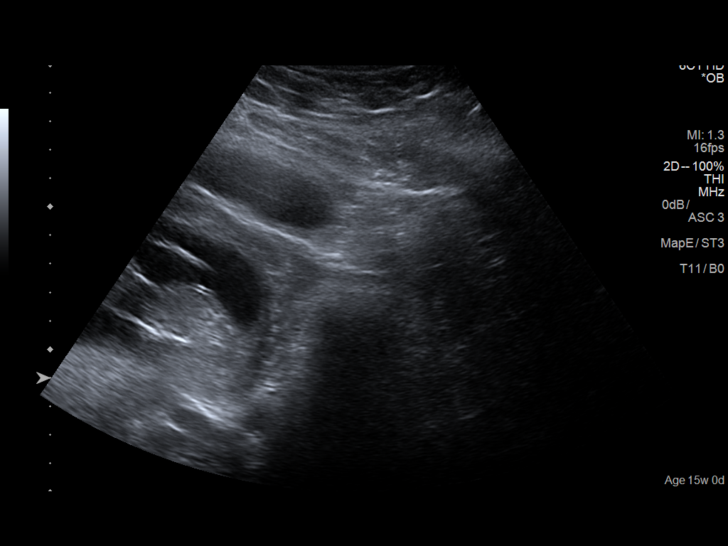
[im 74/80]
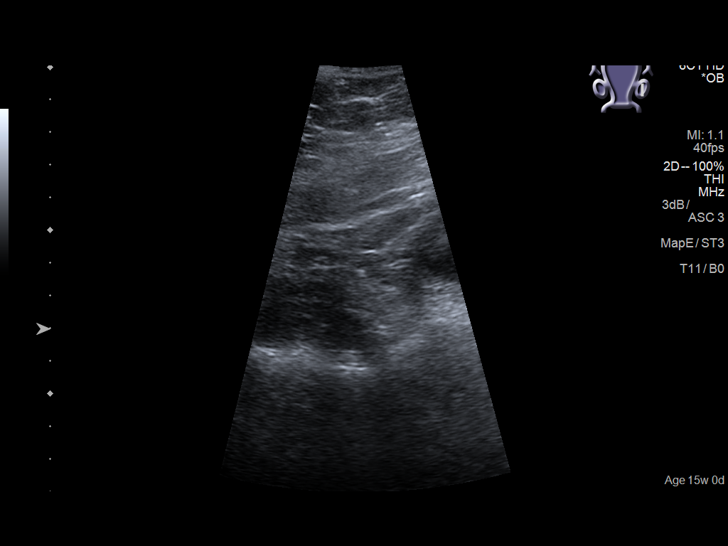
[im 80/80]
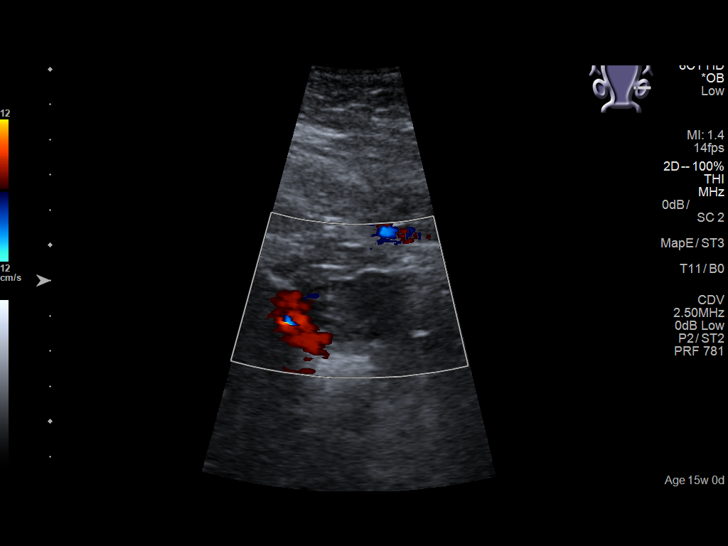

[14 of 28 positions shown; findings below may reference images not displayed]

FINDINGS: Intrauterine gestational sac: Visualized

Yolk sac:  Visualized

Embryo:  Visualized

Cardiac Activity: Visualized

Heart Rate: 157 bpm

CRL:   72  mm   13 w 2 d                  US EDC: June 08, 2018

Subchorionic hemorrhage:  None visualized.

Maternal uterus/adnexae: Cervical os is closed. There is no
extrauterine pelvic or adnexal mass. No free pelvic fluid.
IMPRESSION: Single live anterior gestation with estimated gestational age of 13+
weeks. No subchorionic hemorrhage. Study otherwise unremarkable.

## 2018-07-14 ENCOUNTER — Encounter: Payer: Self-pay | Admitting: Obstetrics and Gynecology

## 2018-07-14 ENCOUNTER — Other Ambulatory Visit (HOSPITAL_COMMUNITY)
Admission: RE | Admit: 2018-07-14 | Discharge: 2018-07-14 | Disposition: A | Discharge status: Home / Self Care | Payer: Managed Care, Other (non HMO) | Source: Ambulatory Visit | Attending: Obstetrics and Gynecology | Admitting: Obstetrics and Gynecology

## 2018-07-14 ENCOUNTER — Telehealth: Payer: Self-pay | Admitting: Obstetrics and Gynecology

## 2018-07-14 ENCOUNTER — Ambulatory Visit (INDEPENDENT_AMBULATORY_CARE_PROVIDER_SITE_OTHER): Payer: Managed Care, Other (non HMO) | Admitting: Obstetrics and Gynecology

## 2018-07-14 DIAGNOSIS — Z01818 Encounter for other preprocedural examination: Secondary | ICD-10-CM | POA: Diagnosis not present

## 2018-07-14 DIAGNOSIS — Z124 Encounter for screening for malignant neoplasm of cervix: Secondary | ICD-10-CM | POA: Insufficient documentation

## 2018-07-14 DIAGNOSIS — Z113 Encounter for screening for infections with a predominantly sexual mode of transmission: Secondary | ICD-10-CM

## 2018-07-14 MED ORDER — HYDROCHLOROTHIAZIDE 12.5 MG PO TABS: 12.5000 mg | ORAL_TABLET | Freq: Every day | ORAL | 3 refills | Status: DC

## 2018-07-14 NOTE — Telephone Encounter (Signed)
Patient is aware of H&P at [facility] on 07/22/18 @ 9:50am w/ Dr. Bonney AidStaebler, Pre-admit Testing afterwards, and OR on 07/30/18. Patient is aware she may receive calls [facility] the [facility] and [facility]. Patient confirmed [employer] and secondary Medicaid. Ext given.

## 2018-07-14 NOTE — Progress Notes (Signed)
Postpartum Visit  Chief Complaint:  Chief Complaint  Patient presents with  . Postpartum Care    vaginal delivery 6/23 Headaches  . Foot Swelling    History of Present Illness: Patient is a 27 y.o. [id number] presents for postpartum visit.   Review the Delivery Report for details.  Delivery Note On 05/31/2018 10:18 AM a viable female was delivered via Vaginal, APGAR: 9, 9; weight 6 lb 10.9 oz (3030 g).    Pregnancy or labor problems:  no Yes headaches and swelling  Maternal Details:  Breast or formula feeding: plans to breastfeed and bottlefeed Any bowel or bladder issues: No  Post partum depression/anxiety noted:  no Edinburgh Post-Partum Depression Score:0   Review of Systems: Review of Systems  Constitutional: Negative for chills and fever.  HENT: Negative for congestion.   Respiratory: Negative for cough and shortness of breath.   Cardiovascular: Positive for leg swelling. Negative for chest pain and palpitations.  Gastrointestinal: Negative for abdominal pain, constipation, diarrhea, heartburn, nausea and vomiting.  Genitourinary: Negative for dysuria, frequency and urgency.  Skin: Negative for itching and rash.  Neurological: Positive for headaches. Negative for dizziness.  Endo/Heme/Allergies: Negative for polydipsia.  Psychiatric/Behavioral: Negative for depression.    The following portions of the patient's history were reviewed and updated as appropriate: allergies, current medications, past family history, past medical history, past social history, past surgical history and problem list.  Past Medical History:  Past Medical History:  Diagnosis Date  . Anemia   . Anxiety   . Bipolar 1 disorder (HCC)   . Depression   . GERD (gastroesophageal reflux disease)   . Headache   . Hepatitis B affecting pregnancy 2007   No current treatment required, titers show up negative  . Hypertension     Past Surgical History:  Past Surgical History:  Procedure  Laterality Date  . NO PAST SURGERIES    . NO PAST SURGERIES      Family History:  Family History  Problem Relation Age of Onset  . Cancer Neg Hx   . Diabetes Neg Hx   . Heart disease Neg Hx   . Stroke Neg Hx     Social History:  Social History   Socioeconomic History  . Marital status: Single    Spouse name: Not on file  . Number of children: Not on file  . Years of education: Not on file  . Highest education level: Not on file  Occupational History  . Not on file  Social Needs  . Financial resource strain: Not on file  . Food insecurity:    Worry: Not on file    Inability: Not on file  . Transportation needs:    Medical: Not on file    Non-medical: Not on file  Tobacco Use  . Smoking status: Former Games developer  . Smokeless tobacco: Never Used  Substance and Sexual Activity  . Alcohol use: No  . Drug use: No  . Sexual activity: Yes    Birth control/protection: Surgical    Comment: Tubal ligation  Lifestyle  . Physical activity:    Days per week: Not on file    Minutes per session: Not on file  . Stress: Not on file  Relationships  . Social connections:    Talks on phone: Not on file    Gets together: Not on file    Attends religious service: Not on file    Active member of club or organization: Not on file  Attends meetings of clubs or organizations: Not on file    Relationship status: Not on file  . Intimate partner violence:    Fear of current or ex partner: Not on file    Emotionally abused: Not on file    Physically abused: Not on file    Forced sexual activity: Not on file  Other Topics Concern  . Not on file  Social History Narrative  . Not on file    Allergies:  Allergies  Allergen Reactions  . Penicillins Swelling    Has patient had a PCN reaction causing immediate rash, facial/tongue/throat swelling, SOB or lightheadedness with hypotension: No Has patient had a PCN reaction causing severe rash involving mucus membranes or skin necrosis:  No Has patient had a PCN reaction that required hospitalization No Has patient had a PCN reaction occurring within the last 10 years: No If all of the above answers are "NO", then may proceed with Cephalosporin use.    Medications: Prior to Admission medications   Medication Sig Start Date End Date Taking? Authorizing Provider  Prenatal Vit-Fe Fumarate-FA (PRENATAL MULTIVITAMIN) TABS tablet Take 1 tablet by mouth daily at 12 noon.    [provider]    Physical Exam Blood pressure (!) 134/100, pulse 72, height 5\' 6"  (1.676 m), weight 294 lb (133.4 kg), last menstrual period 09/01/2017, currently breastfeeding. -14 lbs since delivery  General: NAD HEENT: normocephalic, anicteric Pulmonary: No increased work of breathing Abdomen: NABS, soft, non-tender, non-distended.  Umbilicus without lesions.  No hepatomegaly, splenomegaly or masses palpable. No evidence of hernia. Genitourinary:  External: Normal external female genitalia.  Normal urethral meatus, normal  Bartholin's and Skene's glands.    Vagina: Normal vaginal mucosa, no evidence of prolapse.    Cervix: Grossly normal in appearance, no bleeding  Uterus: Non-enlarged, mobile, normal contour.  No CMT  Adnexa: ovaries non-enlarged, no adnexal masses  Rectal: deferred Extremities: no edema, erythema, or tenderness Neurologic: Grossly intact Psychiatric: mood appropriate, affect full    Assessment: 27 y.o. [id number] presenting for 6 week postpartum visit  Plan: Problem List Items Addressed This Visit    None    Visit Diagnoses    6 weeks postpartum follow-up    -  Primary   Relevant Orders   Cytology - PAP   Screening for malignant neoplasm of cervix       Relevant Orders   Cytology - PAP   Routine screening for STI (sexually transmitted infection)       Relevant Orders   Cytology - PAP   Pre-operative evaluation for tubal ligation           1) Contraception - Education given regarding options for  contraception, as well as compatibility with breast feeding if applicable.  Patient plans on tubal ligation for contraception.  27 y.o. A5W0981  with undesired fertility, desires permanent sterilization.  Other reversible forms of contraception were discussed with patient; she declines all other modalities. Permanent nature of as well as associated risks of the procedure discussed with patient including but not limited to: risk of regret, permanence of method, bleeding, infection, injury to surrounding organs and need for additional procedures.  Failure risk of 0.5-1% with increased risk of ectopic gestation if pregnancy occurs was also discussed with patient.    2)  Pap - ASCCP guidelines and rational discussed.  ASCCP guidelines and rational discussed.  Patient opts for every 3 years screening interval  3) Patient underwent screening for postpartum depression with no signs of depression  4) Start HCTZ for BP - weight down 14 lbs, now 6 weeks out no issues with BP in pregnancy.  Does have family history of BP issues  5)  Return Will be contacted with date for tubal surgery.   Vena AustriaAndreas Monica Codd, MD, Evern CoreFACOG [facility], [facility] 07/14/2018, 11:01 AM

## 2018-07-16 LAB — CYTOLOGY - PAP
Chlamydia: NEGATIVE
Diagnosis: NEGATIVE
Neisseria Gonorrhea: NEGATIVE

## 2018-07-22 ENCOUNTER — Ambulatory Visit (INDEPENDENT_AMBULATORY_CARE_PROVIDER_SITE_OTHER): Payer: Managed Care, Other (non HMO) | Admitting: Obstetrics and Gynecology

## 2018-07-22 ENCOUNTER — Encounter: Payer: Self-pay | Admitting: Obstetrics and Gynecology

## 2018-07-22 ENCOUNTER — Inpatient Hospital Stay: Admission: RE | Admit: 2018-07-22 | Payer: Managed Care, Other (non HMO) | Source: Ambulatory Visit

## 2018-07-22 VITALS — BP 130/82 | HR 81 | Ht 66.0 in | Wt 294.0 lb

## 2018-07-22 DIAGNOSIS — Z01818 Encounter for other preprocedural examination: Secondary | ICD-10-CM

## 2018-07-22 NOTE — Progress Notes (Signed)
Obstetrics & Gynecology Surgery H&P    Chief Complaint: Scheduled Surgery   History of Present Illness: Patient is a 27 y.o. [id number] presenting for scheduled laparoscopic bilateral tubal ligation via Falope rings , for the treatment or further evaluation of undesired fertility.   Prior Treatments prior to proceeding with surgery include: discussed alternative contraceptive options including LARCs  Preoperative Pap: normal Preoperative Endometrial biopsy: N/A Preoperative Ultrasound: N/A   Review of Systems:10 point review of systems  Past Medical History:  Past Medical History:  Diagnosis Date  . Anemia   . Anxiety   . Bipolar 1 disorder (HCC)   . Depression   . GERD (gastroesophageal reflux disease)   . Headache   . Hepatitis B affecting pregnancy 2007   No current treatment required, titers show up negative  . Hypertension     Past Surgical History:  Past Surgical History:  Procedure Laterality Date  . NO PAST SURGERIES    . NO PAST SURGERIES      Family History:  Family History  Problem Relation Age of Onset  . Cancer Neg Hx   . Diabetes Neg Hx   . Heart disease Neg Hx   . Stroke Neg Hx     Social History:  Social History   Socioeconomic History  . Marital status: Single    Spouse name: Not on file  . Number of children: Not on file  . Years of education: Not on file  . Highest education level: Not on file  Occupational History  . Not on file  Social Needs  . Financial resource strain: Not on file  . Food insecurity:    Worry: Not on file    Inability: Not on file  . Transportation needs:    Medical: Not on file    Non-medical: Not on file  Tobacco Use  . Smoking status: Former Games developermoker  . Smokeless tobacco: Never Used  Substance and Sexual Activity  . Alcohol use: No  . Drug use: No  . Sexual activity: Yes    Birth control/protection: Surgical    Comment: Tubal ligation  Lifestyle  . Physical activity:    Days per week: Not on file    Minutes per session: Not on file  . Stress: Not on file  Relationships  . Social connections:    Talks on phone: Not on file    Gets together: Not on file    Attends religious service: Not on file    Active member of club or organization: Not on file    Attends meetings of clubs or organizations: Not on file    Relationship status: Not on file  . Intimate partner violence:    Fear of current or ex partner: Not on file    Emotionally abused: Not on file    Physically abused: Not on file    Forced sexual activity: Not on file  Other Topics Concern  . Not on file  Social History Narrative  . Not on file    Allergies:  Allergies  Allergen Reactions  . Penicillins Swelling    Has patient had a PCN reaction causing immediate rash, facial/tongue/throat swelling, SOB or lightheadedness with hypotension: No Has patient had a PCN reaction causing severe rash involving mucus membranes or skin necrosis: No Has patient had a PCN reaction that required hospitalization No Has patient had a PCN reaction occurring within the last 10 years: No If all of the above answers are "NO", then may proceed with Cephalosporin  use.    Medications: Prior to Admission medications   Medication Sig Start Date End Date Taking? Authorizing Provider  hydrochlorothiazide (HYDRODIURIL) 12.5 MG tablet Take 1 tablet (12.5 mg total) by mouth daily. 07/14/18  Yes Vena AustriaStaebler, Shawan Corella, MD  Prenatal Vit-Fe Fumarate-FA (PRENATAL MULTIVITAMIN) TABS tablet Take 1 tablet by mouth daily.    Yes [provider]    Physical Exam Vitals: Blood pressure 130/82, pulse 81, height 5\' 6"  (1.676 m), weight 294 lb (133.4 kg), currently breastfeeding. General: NAD HEENT: normocephalic, anicteric Pulmonary: CTAB, No increased work of breathing Cardiovascular: RRR, distal pulses 2+ Abdomen: soft, non-tender, non-distended Genitourinary: deferred Extremities: no edema, erythema, or tenderness Neurologic: Grossly  intact Psychiatric: mood appropriate, affect full  Imaging No results found.  Assessment: 27 y.o. [id number] presenting for scheduled laparoscopic tubal ligation  Plan: 1) 27 y.o. [id number]  with undesired fertility, desires permanent sterilization.  Other reversible forms of contraception were discussed with patient; she declines all other modalities. Permanent nature of as well as associated risks of the procedure discussed with patient including but not limited to: risk of regret, permanence of method, bleeding, infection, injury to surrounding organs and need for additional procedures.  Failure risk of 0.5-1% with increased risk of ectopic gestation if pregnancy occurs was also discussed with patient.    2) Routine postoperative instructions were reviewed with the patient and her family in detail today including the expected length of recovery and likely postoperative course.  The patient concurred with the proposed plan, giving informed written consent for the surgery today.  Patient instructed on the importance of being NPO after midnight prior to her procedure.  If warranted preoperative prophylactic antibiotics and SCDs ordered on call to the OR to meet SCIP guidelines and adhere to recommendation laid forth in ACOG Practice Bulletin Number 104 May 2009  "Antibiotic Prophylaxis for Gynecologic Procedures".     Vena AustriaAndreas Loveda Colaizzi, MD, Evern CoreFACOG [facility], [facility] 07/22/2018, 10:40 AM

## 2018-07-24 ENCOUNTER — Encounter
Admission: RE | Admit: 2018-07-24 | Discharge: 2018-07-24 | Disposition: A | Discharge status: Home / Self Care | Payer: Managed Care, Other (non HMO) | Source: Ambulatory Visit | Attending: Obstetrics and Gynecology | Admitting: Obstetrics and Gynecology

## 2018-07-24 ENCOUNTER — Other Ambulatory Visit: Payer: Self-pay

## 2018-07-24 NOTE — Patient Instructions (Signed)
Your procedure is scheduled on: 07-30-18 THURSDAY Report to Same Day Surgery 2nd floor medical mall [facility](Medical Mall Entrance-take elevator on left to 2nd floor.  Check in with surgery information desk.) To find out your arrival time please call [phone] between 1PM - 3PM on 07-29-18 [facility]  Remember: Instructions that are not followed completely may result in serious medical risk, up to and including death, or upon the discretion of your surgeon and anesthesiologist your surgery may need to be rescheduled.    _x___ 1. Do not eat food after midnight the night before your procedure. NO GUM OR CANDY AFTER MIDNIGHT.  You may drink clear liquids up to 2 hours before you are scheduled to arrive at the hospital for your procedure.  Do not drink clear liquids within 2 hours of your scheduled arrival to the hospital.  Clear liquids include  --Water or Apple juice without pulp  --Clear carbohydrate beverage such as ClearFast or Gatorade  --Black Coffee or Clear Tea (No milk, no creamers, do not add anything to the coffee or Tea   ____Ensure clear carbohydrate drink on the way to the hospital for bariatric patients  ____Ensure clear carbohydrate drink 3 hours before surgery for Dr Rutherford NailByrnett's patients if physician instructed.     __x__ 2. No Alcohol for 24 hours before or after surgery.   __x__3. No Smoking or e-cigarettes for 24 prior to surgery.  Do not use any chewable tobacco products for at least 6 hour prior to surgery   ____  4. Bring all medications with you on the day of surgery if instructed.    __x__ 5. Notify your doctor if there is any change in your medical condition     (cold, fever, infections).    x___6. On the morning of surgery brush your teeth with toothpaste and water.  You may rinse your mouth with mouth wash if you wish.  Do not swallow any toothpaste or mouthwash.   Do not wear jewelry, make-up, hairpins, clips or nail polish.  Do not wear lotions, powders, or  perfumes. You may wear deodorant.  Do not shave 48 hours prior to surgery. Men may shave face and neck.  Do not bring valuables to the hospital.    [facility] is not responsible for any belongings or valuables.               Contacts, dentures or bridgework may not be worn into surgery.  Leave your suitcase in the car. After surgery it may be brought to your room.  For patients admitted to the hospital, discharge time is determined by your treatment team.  _  Patients discharged the day of surgery will not be allowed to drive home.  You will need someone to drive you home and stay with you the night of your procedure.    Please read over the following fact sheets that you were given:   [facility] Preparing for [facility]  ____ Take anti-hypertensive listed below, cardiac, seizure, asthma, anti-reflux and psychiatric medicines. These include:  1. NONE  2.  3.  4.  5.  6.  ____Fleets enema or Magnesium Citrate as directed.   _x___ Use CHG Soap or sage wipes as directed on instruction sheet   ____ Use inhalers on the day of surgery and bring to hospital day of surgery  ____ Stop Metformin and Janumet 2 days prior to surgery.    ____ Take 1/2 of usual insulin dose the night before surgery and none on  the morning surgery.   ____ Follow recommendations from Cardiologist, Pulmonologist or PCP regarding stopping Aspirin, Coumadin, Plavix ,Eliquis, Effient, or Pradaxa, and Pletal.  X____Stop Anti-inflammatories such as Advil, Aleve, Ibuprofen, Motrin, Naproxen, Naprosyn, Goodies powders or aspirin products NOW-OK to take Tylenol   ____ Stop supplements until after surgery.    ____ Bring C-Pap to the hospital.

## 2018-07-27 ENCOUNTER — Encounter
Admission: RE | Admit: 2018-07-27 | Discharge: 2018-07-27 | Disposition: A | Discharge status: Home / Self Care | Payer: Managed Care, Other (non HMO) | Source: Ambulatory Visit | Attending: Obstetrics and Gynecology | Admitting: Obstetrics and Gynecology

## 2018-07-27 DIAGNOSIS — Z302 Encounter for sterilization: Secondary | ICD-10-CM | POA: Diagnosis present

## 2018-07-27 DIAGNOSIS — K219 Gastro-esophageal reflux disease without esophagitis: Secondary | ICD-10-CM | POA: Diagnosis not present

## 2018-07-27 DIAGNOSIS — F329 Major depressive disorder, single episode, unspecified: Secondary | ICD-10-CM | POA: Diagnosis not present

## 2018-07-27 DIAGNOSIS — Z79899 Other long term (current) drug therapy: Secondary | ICD-10-CM | POA: Diagnosis not present

## 2018-07-27 LAB — BASIC METABOLIC PANEL
Anion gap: 5 (ref 5–15)
BUN: 16 mg/dL (ref 6–20)
CO2: 30 mmol/L (ref 22–32)
Calcium: 8.6 mg/dL — ABNORMAL LOW (ref 8.9–10.3)
Chloride: 107 mmol/L (ref 98–111)
Creatinine, Ser: 0.69 mg/dL (ref 0.44–1.00)
GFR calc Af Amer: >60 mL/min (ref >60)
GFR calc non Af Amer: >60 mL/min (ref >60)
Glucose, Bld: 94 mg/dL (ref 70–99)
Potassium: 4.3 mmol/L (ref 3.5–5.1)
Sodium: 142 mmol/L (ref 135–145)

## 2018-07-30 ENCOUNTER — Other Ambulatory Visit: Payer: Self-pay

## 2018-07-30 ENCOUNTER — Ambulatory Visit
Admission: RE | Admit: 2018-07-30 | Discharge: 2018-07-30 | Disposition: A | Discharge status: Home / Self Care | Payer: Managed Care, Other (non HMO) | Source: Ambulatory Visit | Attending: Obstetrics and Gynecology | Admitting: Obstetrics and Gynecology

## 2018-07-30 ENCOUNTER — Encounter
Admission: RE | Disposition: A | Discharge status: Home / Self Care | Payer: Self-pay | Source: Ambulatory Visit | Attending: Obstetrics and Gynecology

## 2018-07-30 ENCOUNTER — Encounter: Payer: Self-pay | Admitting: *Deleted

## 2018-07-30 ENCOUNTER — Ambulatory Visit: Payer: Managed Care, Other (non HMO) | Admitting: Anesthesiology

## 2018-07-30 DIAGNOSIS — F329 Major depressive disorder, single episode, unspecified: Secondary | ICD-10-CM | POA: Insufficient documentation

## 2018-07-30 DIAGNOSIS — Z79899 Other long term (current) drug therapy: Secondary | ICD-10-CM | POA: Insufficient documentation

## 2018-07-30 DIAGNOSIS — Z302 Encounter for sterilization: Secondary | ICD-10-CM | POA: Diagnosis not present

## 2018-07-30 DIAGNOSIS — K219 Gastro-esophageal reflux disease without esophagitis: Secondary | ICD-10-CM | POA: Insufficient documentation

## 2018-07-30 DIAGNOSIS — Z9851 Tubal ligation status: Secondary | ICD-10-CM

## 2018-07-30 HISTORY — PX: TUBAL LIGATION: SHX77

## 2018-07-30 HISTORY — PX: LAPAROSCOPIC TUBAL LIGATION: SHX1937

## 2018-07-30 LAB — POCT PREGNANCY, URINE: Preg Test, Ur: NEGATIVE

## 2018-07-30 LAB — HEMOGLOBIN: Hemoglobin: 12 g/dL (ref 12.0–16.0)

## 2018-07-30 SURGERY — LIGATION, FALLOPIAN TUBE, LAPAROSCOPIC
Anesthesia: General | Site: Abdomen | Wound class: Clean Contaminated

## 2018-07-30 MED ORDER — LIDOCAINE HCL (CARDIAC) PF 100 MG/5ML IV SOSY
PREFILLED_SYRINGE | INTRAVENOUS | Status: DC | PRN
  Administered 2018-07-30: 60 mg via INTRAVENOUS

## 2018-07-30 MED ORDER — CLINDAMYCIN PHOSPHATE 900 MG/50ML IV SOLN
INTRAVENOUS | Status: DC | PRN
  Administered 2018-07-30: 900 mg via INTRAVENOUS

## 2018-07-30 MED ORDER — OXYCODONE HCL 5 MG/5ML PO SOLN: 5.0000 mg | Freq: Once | ORAL | Status: AC | PRN

## 2018-07-30 MED ORDER — ROCURONIUM BROMIDE 100 MG/10ML IV SOLN
INTRAVENOUS | Status: DC | PRN
  Administered 2018-07-30: 5 mg via INTRAVENOUS
  Administered 2018-07-30: 15 mg via INTRAVENOUS
  Administered 2018-07-30: 10 mg via INTRAVENOUS

## 2018-07-30 MED ORDER — MIDAZOLAM HCL 5 MG/5ML IJ SOLN
INTRAMUSCULAR | Status: DC | PRN
  Administered 2018-07-30: 2 mg via INTRAVENOUS

## 2018-07-30 MED ORDER — SILVER SULFADIAZINE 1 % EX CREA
TOPICAL_CREAM | CUTANEOUS | Status: AC
  Filled 2018-07-30: qty 85

## 2018-07-30 MED ORDER — FENTANYL CITRATE (PF) 100 MCG/2ML IJ SOLN
INTRAMUSCULAR | Status: AC
  Filled 2018-07-30: qty 2

## 2018-07-30 MED ORDER — FENTANYL CITRATE (PF) 100 MCG/2ML IJ SOLN
INTRAMUSCULAR | Status: AC
  Administered 2018-07-30: 50 ug via INTRAVENOUS
  Filled 2018-07-30: qty 2

## 2018-07-30 MED ORDER — FAMOTIDINE 20 MG PO TABS
ORAL_TABLET | ORAL | Status: AC
  Filled 2018-07-30: qty 1

## 2018-07-30 MED ORDER — PROPOFOL 10 MG/ML IV BOLUS
INTRAVENOUS | Status: AC
  Filled 2018-07-30: qty 20

## 2018-07-30 MED ORDER — HYDROMORPHONE HCL 1 MG/ML IJ SOLN
0.2500 mg | INTRAMUSCULAR | Status: DC | PRN
  Administered 2018-07-30 (×4): 0.25 mg via INTRAVENOUS

## 2018-07-30 MED ORDER — KETOROLAC TROMETHAMINE 30 MG/ML IJ SOLN
INTRAMUSCULAR | Status: DC | PRN
  Administered 2018-07-30: 30 mg via INTRAVENOUS

## 2018-07-30 MED ORDER — ACETAMINOPHEN 10 MG/ML IV SOLN
INTRAVENOUS | Status: DC | PRN
  Administered 2018-07-30: 1000 mg via INTRAVENOUS

## 2018-07-30 MED ORDER — PROPOFOL 10 MG/ML IV BOLUS
INTRAVENOUS | Status: DC | PRN
  Administered 2018-07-30: 200 mg via INTRAVENOUS

## 2018-07-30 MED ORDER — DEXAMETHASONE SODIUM PHOSPHATE 10 MG/ML IJ SOLN
INTRAMUSCULAR | Status: DC | PRN
  Administered 2018-07-30: 6 mg via INTRAVENOUS

## 2018-07-30 MED ORDER — LACTATED RINGERS IV SOLN
INTRAVENOUS | Status: DC
  Administered 2018-07-30 (×2): via INTRAVENOUS

## 2018-07-30 MED ORDER — OXYCODONE-ACETAMINOPHEN 5-325 MG PO TABS: 1.0000 | ORAL_TABLET | ORAL | 0 refills | Status: DC | PRN

## 2018-07-30 MED ORDER — FENTANYL CITRATE (PF) 100 MCG/2ML IJ SOLN
INTRAMUSCULAR | Status: DC | PRN
  Administered 2018-07-30 (×2): 50 ug via INTRAVENOUS

## 2018-07-30 MED ORDER — CLINDAMYCIN PHOSPHATE 900 MG/50ML IV SOLN
INTRAVENOUS | Status: AC
  Filled 2018-07-30: qty 50

## 2018-07-30 MED ORDER — SUCCINYLCHOLINE CHLORIDE 20 MG/ML IJ SOLN
INTRAMUSCULAR | Status: DC | PRN
  Administered 2018-07-30: 120 mg via INTRAVENOUS

## 2018-07-30 MED ORDER — SUGAMMADEX SODIUM 200 MG/2ML IV SOLN
INTRAVENOUS | Status: DC | PRN
  Administered 2018-07-30: 300 mg via INTRAVENOUS

## 2018-07-30 MED ORDER — DEXAMETHASONE SODIUM PHOSPHATE 10 MG/ML IJ SOLN
INTRAMUSCULAR | Status: AC
  Filled 2018-07-30: qty 1

## 2018-07-30 MED ORDER — BUPIVACAINE HCL 0.5 % IJ SOLN
INTRAMUSCULAR | Status: DC | PRN
  Administered 2018-07-30: 10 mL

## 2018-07-30 MED ORDER — HYDROMORPHONE HCL 1 MG/ML IJ SOLN
INTRAMUSCULAR | Status: AC
  Administered 2018-07-30: 0.25 mg via INTRAVENOUS
  Filled 2018-07-30: qty 1

## 2018-07-30 MED ORDER — SUCCINYLCHOLINE CHLORIDE 20 MG/ML IJ SOLN
INTRAMUSCULAR | Status: AC
  Filled 2018-07-30: qty 1

## 2018-07-30 MED ORDER — ACETAMINOPHEN NICU IV SYRINGE 10 MG/ML
INTRAVENOUS | Status: AC
  Filled 2018-07-30: qty 1

## 2018-07-30 MED ORDER — GLYCOPYRROLATE 0.2 MG/ML IJ SOLN
INTRAMUSCULAR | Status: DC | PRN
  Administered 2018-07-30: 0.2 mg via INTRAVENOUS

## 2018-07-30 MED ORDER — ONDANSETRON HCL 4 MG/2ML IJ SOLN
INTRAMUSCULAR | Status: DC | PRN
  Administered 2018-07-30: 4 mg via INTRAVENOUS

## 2018-07-30 MED ORDER — OXYCODONE HCL 5 MG PO TABS
5.0000 mg | ORAL_TABLET | Freq: Once | ORAL | Status: AC | PRN
  Administered 2018-07-30: 5 mg via ORAL

## 2018-07-30 MED ORDER — IBUPROFEN 600 MG PO TABS: 600.0000 mg | ORAL_TABLET | Freq: Four times a day (QID) | ORAL | 3 refills | Status: DC | PRN

## 2018-07-30 MED ORDER — ONDANSETRON HCL 4 MG/2ML IJ SOLN
INTRAMUSCULAR | Status: AC
  Filled 2018-07-30: qty 2

## 2018-07-30 MED ORDER — MIDAZOLAM HCL 2 MG/2ML IJ SOLN
INTRAMUSCULAR | Status: AC
  Filled 2018-07-30: qty 2

## 2018-07-30 MED ORDER — OXYCODONE HCL 5 MG PO TABS
ORAL_TABLET | ORAL | Status: AC
  Administered 2018-07-30: 5 mg via ORAL
  Filled 2018-07-30: qty 1

## 2018-07-30 MED ORDER — ROCURONIUM BROMIDE 50 MG/5ML IV SOLN
INTRAVENOUS | Status: AC
  Filled 2018-07-30: qty 1

## 2018-07-30 MED ORDER — FENTANYL CITRATE (PF) 100 MCG/2ML IJ SOLN
25.0000 ug | INTRAMUSCULAR | Status: DC | PRN
  Administered 2018-07-30 (×4): 50 ug via INTRAVENOUS

## 2018-07-30 MED ORDER — FAMOTIDINE 20 MG PO TABS
20.0000 mg | ORAL_TABLET | Freq: Once | ORAL | Status: AC
  Administered 2018-07-30: 20 mg via ORAL

## 2018-07-30 SURGICAL SUPPLY — 28 items
BLADE SURG SZ11 CARB STEEL (BLADE) ×3 IMPLANT
CATH ROBINSON RED A/P 16FR (CATHETERS) ×3 IMPLANT
CHLORAPREP W/TINT 26ML (MISCELLANEOUS) ×3 IMPLANT
DERMABOND ADVANCED (GAUZE/BANDAGES/DRESSINGS) ×2
DERMABOND ADVANCED .7 DNX12 (GAUZE/BANDAGES/DRESSINGS) ×1 IMPLANT
DRSG TEGADERM 2-3/8X2-3/4 SM (GAUZE/BANDAGES/DRESSINGS) ×3 IMPLANT
ELECT REM PT RETURN 9FT ADLT (ELECTROSURGICAL) ×3
ELECTRODE REM PT RTRN 9FT ADLT (ELECTROSURGICAL) ×1 IMPLANT
GLOVE BIO SURGEON STRL SZ7 (GLOVE) ×3 IMPLANT
GLOVE INDICATOR 7.5 STRL GRN (GLOVE) ×3 IMPLANT
GOWN STRL REUS W/ TWL LRG LVL3 (GOWN DISPOSABLE) ×2 IMPLANT
GOWN STRL REUS W/TWL LRG LVL3 (GOWN DISPOSABLE) ×4
IRRIGATION STRYKERFLOW (MISCELLANEOUS) ×1 IMPLANT
IRRIGATOR STRYKERFLOW (MISCELLANEOUS) ×3
KIT DISPOSABLE FALLOPE RING (Ring) ×3 IMPLANT
KIT PINK PAD W/HEAD ARE REST (MISCELLANEOUS) ×3
KIT PINK PAD W/HEAD ARM REST (MISCELLANEOUS) ×1 IMPLANT
KIT TURNOVER CYSTO (KITS) ×3 IMPLANT
LABEL OR SOLS (LABEL) ×3 IMPLANT
MANIPULATOR VCARE MED CRV RETR (MISCELLANEOUS) ×3 IMPLANT
NS IRRIG 500ML POUR BTL (IV SOLUTION) ×3 IMPLANT
PACK GYN LAPAROSCOPIC (MISCELLANEOUS) ×3 IMPLANT
PAD OB MATERNITY 4.3X12.25 (PERSONAL CARE ITEMS) ×3 IMPLANT
PAD PREP 24X41 OB/GYN DISP (PERSONAL CARE ITEMS) ×3 IMPLANT
SLEEVE PROTECTION STRL DISP (MISCELLANEOUS) ×6 IMPLANT
SUT MNCRL AB 4-0 PS2 18 (SUTURE) ×3 IMPLANT
TROCAR XCEL NON-BLD 5MMX100MML (ENDOMECHANICALS) ×3 IMPLANT
TUBING INSUFFLATION (TUBING) ×3 IMPLANT

## 2018-07-30 NOTE — H&P (Signed)
Date of Initial H&P: 07/22/2018  History reviewed, patient examined, no change in status, stable for surgery.

## 2018-07-30 NOTE — Discharge Instructions (Signed)

## 2018-07-30 NOTE — Op Note (Signed)
Preoperative Diagnosis: 1) 27 y.o.  [id number] with undesired fertility  Postoperative Diagnosis: 1) 27 y.o. [id number] with undesired fertility  Operation Performed: Laparoscopic bilateral tubal ligation via falope ring application  Indication: 27 y.o. [id number]  with undesired fertility, desires permanent sterilization.  Other reversible forms of contraception were discussed with patient; she declines all other modalities. Permanent nature of as well as associated risks of the procedure discussed with patient including but not limited to: risk of regret, permanence of method, bleeding, infection, injury to surrounding organs and need for additional procedures.  Failure risk of 0.5-1% with increased risk of ectopic gestation if pregnancy occurs was also discussed with patient.    Surgeon: Vena AustriaAndreas Merrit Waugh, MD  Anesthesia: General  Preoperative Antibiotics: Clindamycin  Estimated Blood Loss: 10mL  IV Fluids: 1L  Urine Output:: 50mL  Drains or Tubes: none  Implants: none  Specimens Removed: none  Complications: none  Intraoperative Findings: Normal tubes, ovaries, and uterus.  Good 1cm knuckle of tube applied within each falope ring.  V-care tip did partially perforate the uterine fundus posteriorly.  Hemostatic after removal and observation at the end of the case.    Patient Condition: stable  Procedure in Detail:  Patient was taken to the operating room where she was administered general anesthesia.  She was positioned in the dorsal lithotomy position utilizing Allen stirups, prepped and draped in the usual sterile fashion.  Prior to proceeding with procedure a time out was performed.  Attention was turned to the patient's pelvis.  A red rubber catheter was used to empty the patient's bladder.  An operative speculum was placed to allow visualization of the cervix.  The anterior lip of the cervix was grasped with a single tooth tenaculum, and a medium V-care was placed to allow  manipulation of the uterus.  The operative speculum and single tooth tenaculum were then removed.  Attention was turned to the patient's abdomen.  The umbilicus was infiltrated with 1% Sensorcaine, before making a stab incision using an 11 blade scalpel.  A 5mm Excel trocar was then used to gain direct entry into the peritoneal cavity utilizing the camera to visualize progress of the trocar during placement.  Once peritoneal entry had been achieved, insufflation was started and pneumoperitoneum established at a pressure of 15mmHg.   General inspection of the abdomen revealed the above noted findings.  A spot 2cm midline above the pubic symphysis was injected with 1% Sensorcaine and a stab incision was made using an 11 blade scalpel.  The 8mm falope ring trocar was place suprapubic through this incision under direct visualization.  The right tube was identified and grasped in a mid-isthmic portion using the falope ring application.  The falope ring was applied with a good 1cm knuckle of blanching tube noted within the falope ring after application.  The left tube was identified and a second falope ring was applied in a similar fashion.  Pneumoperitoneum was evacuated.  The trocars were removed.  The 8mm trocar site was closed with 4-0 Monocryl in a subcuticular fashion.  All trocar sites were then dressed with surgical skin glue.  The Hulka tenaculum was removed.  Sponge needle and instrument counts were correct time two.  The patient tolerated the procedure well and was taken to the recovery room in stable condition.

## 2018-07-30 NOTE — Transfer of Care (Signed)
Immediate Anesthesia Transfer of Care Note  Patient: Regina Chandler  Procedure(s) Performed: LAPAROSCOPIC TUBAL LIGATION (N/A Abdomen)  Patient Location: PACU  Anesthesia Type:General  Level of Consciousness: sedated  Airway & Oxygen Therapy: Patient Spontanous Breathing and Patient connected to face mask oxygen  Post-op Assessment: Report given to RN and Post -op Vital signs reviewed and stable  Post vital signs: Reviewed and stable  Last Vitals:  Vitals Value Taken Time  BP 130/93 07/30/2018  4:31 PM  Temp 36.8 C 07/30/2018  4:30 PM  Pulse 86 07/30/2018  4:32 PM  Resp 10 07/30/2018  4:32 PM  SpO2 100 % 07/30/2018  4:32 PM  Vitals shown include unvalidated device data.  Last Pain:  Vitals:   07/30/18 1412  TempSrc: Oral  PainSc: 0-No pain         Complications: No apparent anesthesia complications

## 2018-07-30 NOTE — Anesthesia Preprocedure Evaluation (Signed)
Anesthesia Evaluation  Patient identified by MRN, date of birth, ID band Patient awake    Reviewed: Allergy & Precautions, H&P , NPO status , Patient's Chart, lab work & pertinent test results  History of Anesthesia Complications Negative for: history of anesthetic complications  Airway Mallampati: II  TM Distance: >3 FB Neck ROM: full    Dental  (+) Chipped   Pulmonary neg pulmonary ROS, neg shortness of breath,           Cardiovascular Exercise Tolerance: Good hypertension, (-) angina(-) Past MI and (-) DOE      Neuro/Psych  Headaches, PSYCHIATRIC DISORDERS Anxiety Depression Bipolar Disorder    GI/Hepatic GERD  Medicated and Controlled,(+) Hepatitis -, B  Endo/Other  negative endocrine ROS  Renal/GU      Musculoskeletal   Abdominal   Peds  Hematology negative hematology ROS (+)   Anesthesia Other Findings Past Medical History: No date: Anemia No date: Anxiety No date: Bipolar 1 disorder (HCC) No date: Depression No date: GERD (gastroesophageal reflux disease)     Comment:  NO MEDS No date: Headache 2007: Hepatitis B affecting pregnancy     Comment:  No current treatment required, titers show up negative No date: Hypertension  Past Surgical History: No date: NO PAST SURGERIES No date: NO PAST SURGERIES  BMI    Body Mass Index:  47.47 kg/m      Reproductive/Obstetrics negative OB ROS                             Anesthesia Physical Anesthesia Plan  ASA: III  Anesthesia Plan: General ETT   Post-op Pain Management:    Induction: Intravenous  PONV Risk Score and Plan: Dexamethasone, Ondansetron, Midazolam and Treatment may vary due to age or medical condition  Airway Management Planned: Oral ETT  Additional Equipment:   Intra-op Plan:   Post-operative Plan: Extubation in OR  Informed Consent: I have reviewed the patients History and Physical, chart, labs and  discussed the procedure including the risks, benefits and alternatives for the proposed anesthesia with the patient or authorized representative who has indicated his/her understanding and acceptance.   Dental Advisory Given  Plan Discussed with: Anesthesiologist, CRNA and Surgeon  Anesthesia Plan Comments: (Patient consented for risks of anesthesia including but not limited to:  - adverse reactions to medications - damage to teeth, lips or other oral mucosa - sore throat or hoarseness - Damage to heart, brain, lungs or loss of life  Patient voiced understanding.)        Anesthesia Quick Evaluation

## 2018-07-30 NOTE — Anesthesia Post-op Follow-up Note (Signed)
Anesthesia QCDR form completed.        

## 2018-07-30 NOTE — Anesthesia Procedure Notes (Signed)
Procedure Name: Intubation Date/Time: 07/30/2018 3:14 PM Performed by: Lily KocherPeralta, Shanoah Asbill, CRNA Pre-anesthesia Checklist: Patient identified, Patient being monitored, Timeout performed, Emergency Drugs available and Suction available Patient Re-evaluated:Patient Re-evaluated prior to induction Oxygen Delivery Method: Circle system utilized Preoxygenation: Pre-oxygenation with 100% oxygen Induction Type: IV induction Ventilation: Mask ventilation without difficulty Laryngoscope Size: 3 and McGraph Grade View: Grade II Tube type: Oral Tube size: 7.5 mm Number of attempts: 1 Airway Equipment and Method: Stylet Placement Confirmation: ETT inserted through vocal cords under direct vision,  positive ETCO2 and breath sounds checked- equal and bilateral Secured at: 22 cm Tube secured with: Tape Dental Injury: Teeth and Oropharynx as per pre-operative assessment  Difficulty Due To: Difficult Airway- due to immobile epiglottis

## 2018-07-31 ENCOUNTER — Encounter: Payer: Self-pay | Admitting: Obstetrics and Gynecology

## 2018-07-31 NOTE — Anesthesia Postprocedure Evaluation (Signed)
Anesthesia Post Note  Patient: Regina Chandler  Procedure(s) Performed: LAPAROSCOPIC TUBAL LIGATION (N/A Abdomen)  Patient location during evaluation: PACU Anesthesia Type: General Level of consciousness: awake and alert and oriented Pain management: pain level controlled Vital Signs Assessment: post-procedure vital signs reviewed and stable Respiratory status: spontaneous breathing Cardiovascular status: blood pressure returned to baseline Anesthetic complications: no     Last Vitals:  Vitals:   07/30/18 1819 07/30/18 1822  BP: 117/83 127/90  Pulse: 68 83  Resp: 17 20  Temp: 37.1 C 36.7 C  SpO2: 97% 98%    Last Pain:  Vitals:   07/30/18 1822  TempSrc: Temporal  PainSc: 2                  Regina Chandler

## 2018-08-06 ENCOUNTER — Encounter: Payer: Self-pay | Admitting: Obstetrics and Gynecology

## 2018-08-06 ENCOUNTER — Ambulatory Visit (INDEPENDENT_AMBULATORY_CARE_PROVIDER_SITE_OTHER): Payer: Managed Care, Other (non HMO) | Admitting: Obstetrics and Gynecology

## 2018-08-06 VITALS — BP 122/80 | HR 78 | Ht 66.0 in | Wt 294.0 lb

## 2018-08-06 DIAGNOSIS — Z4889 Encounter for other specified surgical aftercare: Secondary | ICD-10-CM

## 2018-08-06 MED ORDER — OXYCODONE-ACETAMINOPHEN 5-325 MG PO TABS: 1.0000 | ORAL_TABLET | ORAL | 0 refills | Status: DC | PRN

## 2018-08-06 NOTE — Progress Notes (Signed)
      Postoperative Follow-up Patient presents post op from laparoscopic BTL 1weeks ago for requested sterilization.  Subjective: Patient reports some improvement in her preop symptoms. Eating a regular diet without difficulty. Pain is controlled with current analgesics. Medications being used: narcotic analgesics including Percocet.  Activity: normal activities of daily living.  Objective: Blood pressure 122/80, pulse 78, height 5\' 6"  (1.676 m), weight 294 lb (133.4 kg), last menstrual period 07/30/2018, currently breastfeeding.  Gen: NAD HEENT: normocephalic, anicteric Pulmonary: no increased work of breathing Abdomen: soft, non-tender, non-distended, incisions D/C/I  Admission on 07/30/2018, Discharged on 07/30/2018  Component Date Value Ref Range Status  . Hemoglobin 07/30/2018 12.0  12.0 - 16.0 g/dL Final   Performed at [facility], [address] [place], [place] [id number]  . Preg Test, Ur 07/30/2018 NEGATIVE  NEGATIVE Final   Comment:        THE SENSITIVITY OF THIS METHODOLOGY IS >24 mIU/mL     Assessment: 27 y.o. s/p laparoscopic BTl stable  Plan: Patient has done well after surgery with no apparent complications.  I have discussed the post-operative course to date, and the expected progress moving forward.  The patient understands what complications to be concerned about.  I will see the patient in routine follow up, or sooner if needed.    Activity plan: No restriction.  - Refill percocet  - Discussed cycle control option post tubal should that be an issue particularly IUD   Vena AustriaAndreas [place]il[place] Mcphee, MD, Merlinda FrederickFACOG [facility], [facility] 08/06/2018, 12:40 PM

## 2018-09-04 ENCOUNTER — Encounter: Payer: Self-pay | Admitting: Family Medicine

## 2018-09-04 ENCOUNTER — Ambulatory Visit (INDEPENDENT_AMBULATORY_CARE_PROVIDER_SITE_OTHER): Payer: Managed Care, Other (non HMO) | Admitting: Family Medicine

## 2018-09-04 ENCOUNTER — Other Ambulatory Visit: Payer: Self-pay

## 2018-09-04 VITALS — BP 111/83 | HR 125 | Temp 98.1°F | Ht 65.5 in | Wt 306.0 lb

## 2018-09-04 DIAGNOSIS — B3731 Acute candidiasis of vulva and vagina: Secondary | ICD-10-CM

## 2018-09-04 DIAGNOSIS — B351 Tinea unguium: Secondary | ICD-10-CM

## 2018-09-04 DIAGNOSIS — R600 Localized edema: Secondary | ICD-10-CM | POA: Diagnosis not present

## 2018-09-04 DIAGNOSIS — Z23 Encounter for immunization: Secondary | ICD-10-CM

## 2018-09-04 DIAGNOSIS — B373 Candidiasis of vulva and vagina: Secondary | ICD-10-CM | POA: Diagnosis not present

## 2018-09-04 DIAGNOSIS — B9689 Other specified bacterial agents as the cause of diseases classified elsewhere: Secondary | ICD-10-CM | POA: Diagnosis not present

## 2018-09-04 DIAGNOSIS — Z7689 Persons encountering health services in other specified circumstances: Secondary | ICD-10-CM

## 2018-09-04 DIAGNOSIS — N760 Acute vaginitis: Secondary | ICD-10-CM

## 2018-09-04 DIAGNOSIS — Z6841 Body Mass Index (BMI) 40.0 and over, adult: Secondary | ICD-10-CM

## 2018-09-04 LAB — WET PREP FOR TRICH, YEAST, CLUE
Clue Cell Exam: POSITIVE — AB
Trichomonas Exam: NEGATIVE
Yeast Exam: POSITIVE — AB

## 2018-09-04 MED ORDER — METRONIDAZOLE 500 MG PO TABS: 500.0000 mg | ORAL_TABLET | Freq: Two times a day (BID) | ORAL | 0 refills | Status: DC

## 2018-09-04 MED ORDER — FLUCONAZOLE 150 MG PO TABS: 150.0000 mg | ORAL_TABLET | Freq: Once | ORAL | 0 refills | Status: AC

## 2018-09-04 NOTE — Progress Notes (Addendum)
BP 111/83   Pulse (!) 125   Temp 98.1 F (36.7 C) (Oral)   Ht 5' 5.5" (1.664 m)   Wt (!) 306 lb (138.8 kg)   SpO2 98%   BMI 50.15 kg/m    Subjective:    Patient ID: Regina Chandler, female    DOB: 1991-09-23, 27 y.o.   MRN: [id number]  HPI: Regina Chandler is a 27 y.o. female  Chief Complaint  Patient presents with  . New Patient (Initial Visit)    pt would like to discuss about her vaginal itching x 1 week   Here today to establish care. Just had a baby 3 months ago and is s/p tubal ligation now.   No chronic medical conditions known to patient other than some LE edema that's been ongoing since before pregnancy. Taking low dose HCTZ and doesn't feel it's helping much. Trying some salt restriction and leg elevation with no relief. Denies pain in legs.   Recently went back to using irish spring soap to wash instead of unscented cera ve wash. Noted vaginal itching starting after that. Denies discharge, odor, pain, dysuria, rashes. Not trying anything OTC for sxs.   Thickened great toenails that are brittle and breaking out x 6 months. Wears steel toed boots at work. No rashes, itching. Has not tried anything on feet.     Relevant past medical, surgical, family and social history reviewed and updated as indicated. Interim medical history since our last visit reviewed. Allergies and medications reviewed and updated.  Review of Systems  Per HPI unless specifically indicated above     Objective:    BP 111/83   Pulse (!) 125   Temp 98.1 F (36.7 C) (Oral)   Ht 5' 5.5" (1.664 m)   Wt (!) 306 lb (138.8 kg)   SpO2 98%   BMI 50.15 kg/m   Wt Readings from Last 3 Encounters:  09/18/18 (!) 302 lb 4.8 oz (137.1 kg)  09/04/18 (!) 306 lb (138.8 kg)  08/06/18 294 lb (133.4 kg)    Physical Exam  Constitutional: She is oriented to person, place, and time. She appears well-developed. No distress.  obese  HENT:  Head: Atraumatic.  Eyes: Conjunctivae and EOM are normal.    Neck: Normal range of motion. Neck supple.  Cardiovascular: Normal rate, regular rhythm and intact distal pulses.  Pulmonary/Chest: Effort normal and breath sounds normal.  Abdominal: Soft. Bowel sounds are normal.  Musculoskeletal: Normal range of motion. She exhibits edema (1+ edema b/l LEs). She exhibits no tenderness or deformity.  Neurological: She is alert and oriented to person, place, and time.  Skin: Skin is warm and dry. No rash noted.  Thickened and flaking great toenails b/l  Psychiatric: She has a normal mood and affect. Her behavior is normal.  Nursing note and vitals reviewed.   Results for orders placed or performed in visit on 09/04/18  WET PREP FOR TRICH, YEAST, CLUE  Result Value Ref Range   Trichomonas Exam Negative Negative   Yeast Exam Positive (A) Negative   Clue Cell Exam Positive (A) Negative      Assessment & Plan:   Problem List Items Addressed This Visit      Other   Bilateral leg edema    Will not increase HCTZ as BPs are low normal. Work on compression, elevation, salt restriction, weight loss. Continue 12.5 mg dose.       Obesity    Interested in weight loss medications. Discussed the importance  of establishing good lifestyle practices prior to initiating any short term weight loss medications. Information given for belviq and contrave today. Diet and exercise reviewed       Other Visit Diagnoses    Vaginal candidiasis    -  Primary   Will tx with diflucan, probiotics. Avoid scented soaps to wash   Relevant Orders   WET PREP FOR TRICH, YEAST, CLUE (Completed)   Encounter to establish care       Flu vaccine need       Relevant Orders   Flu Vaccine QUAD 36+ mos IM (Completed)   Onychomycosis       Lamisil OTC and foot powders. Change socks througout work shifts   BV (bacterial vaginosis)       Tx with flagyl, probiotics, d/c of irish spring soap. Only unscented gentle cleansers or just warm water to wash       Follow up plan: Return  for CPE.

## 2018-09-04 NOTE — Patient Instructions (Addendum)
Work on diet and exercise, wear compression stockings, salt restriction  Lorcaserin oral tablets What is this medicine? LORCASERIN (lor ca SER in) is used to promote and maintain weight loss in obese patients. This medicine should be used with a reduced calorie diet and, if appropriate, an exercise program. This medicine may be used for other purposes; ask your health care provider or pharmacist if you have questions. COMMON BRAND NAME(S): Belviq What should I tell my health care provider before I take this medicine? They need to know if you have any of these conditions: -anatomical deformation of the penis, Peyronie's disease, or history of priapism (painful and prolonged erection) -diabetes -heart disease -history of blood diseases, like sickle cell anemia or leukemia -history of irregular heartbeat -kidney disease -liver disease -suicidal thoughts, plans, or attempt; a previous suicide attempt by you or a family member -an unusual or allergic reaction to lorcaserin, other medicines, foods, dyes, or preservatives -pregnant or trying to get pregnant -breast-feeding How should I use this medicine? Take this medicine by mouth with a glass of water. Follow the directions on the prescription label. You can take it with or without food. Take your medicine at regular intervals. Do not take it more often than directed. Do not stop taking except on your doctor's advice. Talk to your pediatrician regarding the use of this medicine in children. Special care may be needed. Overdosage: If you think you have taken too much of this medicine contact a poison control center or emergency room at once. NOTE: This medicine is only for you. Do not share this medicine with others. What if I miss a dose? If you miss a dose, take it as soon as you can. If it is almost time for your next dose, take only that dose. Do not take double or extra doses. What may interact with this medicine? -cabergoline -certain  medicines for depression, anxiety, or psychotic disturbances -certain medicines for erectile dysfunction -certain medicines for migraine headache like almotriptan, eletriptan, frovatriptan, naratriptan, rizatriptan, sumatriptan, zolmitriptan -dextromethorphan -linezolid -lithium -medicines for diabetes -other weight loss products -tramadol -St. John's Wort -stimulant medicines for attention disorders, weight loss, or to stay awake -tryptophan This list may not describe all possible interactions. Give your health care provider a list of all the medicines, herbs, non-prescription drugs, or dietary supplements you use. Also tell them if you smoke, drink alcohol, or use illegal drugs. Some items may interact with your medicine. What should I watch for while using this medicine? This medicine is intended to be used in addition to a healthy diet and appropriate exercise. The best results are achieved this way. Your doctor should instruct you to stop taking this medicine if you do not lose a certain amount of weight within the first 12 weeks of treatment, but it is important that you do not change your dose in any way without consulting your doctor or health care professional. Visit your doctor or health care professional for regular checkups. Your doctor may order blood tests or other tests to see how you are doing. Do not drive, use machinery, or do anything that needs mental alertness until you know how this medicine affects you. This medicine may affect blood sugar levels. If you have diabetes, check with your doctor or health care professional before you change your diet or the dose of your diabetic medicine. Patients and their families should watch out for worsening depression or thoughts of suicide. Also watch out for sudden changes in feelings such as  feeling anxious, agitated, panicky, irritable, hostile, aggressive, impulsive, severely restless, overly excited and hyperactive, or not being able  to sleep. If this happens, especially at the beginning of treatment or after a change in dose, call your health care professional. Contact your doctor or health care professional right away if you are a man with an erection that lasts longer than 4 hours or if the erection becomes painful. This may be a sign of serious problem and must be treated right away to prevent permanent damage. What side effects may I notice from receiving this medicine? Side effects that you should report to your doctor or health care professional as soon as possible: -allergic reactions like skin rash, itching or hives, swelling of the face, lips, or tongue -abnormal production of milk -breast enlargement in both males and females -breathing problems -changes in emotions or moods -changes in vision -confusion -erection lasting more than 4 hours or a painful erection -fast or irregular heart beat -feeling faint or lightheaded, falls -fever or chills, sore throat -hallucination, loss of contact with reality -high or low blood pressure -menstrual changes -restlessness -slow or irregular heartbeat -stiff muscles -sweating -suicidal thoughts or other mood changes -swelling of the ankles, feet, hands -unusually weak or tired -vomiting Side effects that usually do not require medical attention (report to your doctor or health care professional if they continue or are bothersome): -back pain -constipation -cough -dry mouth -nausea -tiredness This list may not describe all possible side effects. Call your doctor for medical advice about side effects. You may report side effects to FDA at 1-800-FDA-1088. Where should I keep my medicine? Keep out of the reach of children. This medicine can be abused. Keep your medicine in a safe place to protect it from theft. Do not share this medicine with anyone. Selling or giving away this medicine is dangerous and against the law. Store at room temperature between 15 and 30  degrees C (59 and 86 degrees F). Throw away any unused medicine after the expiration date. NOTE: This sheet is a summary. It may not cover all possible information. If you have questions about this medicine, talk to your doctor, pharmacist, or health care provider.  2018 Elsevier/Gold Standard (2015-12-28 12:13:31) Bupropion; Naltrexone extended-release tablets What is this medicine? BUPROPION; NALTREXONE (byoo PROE pee on; nal TREX one) is a combination product used to promote and maintain weight loss in obese adults or overweight adults who also have weight related medical problems. This medicine should be used with a reduced calorie diet and increased physical activity. This medicine may be used for other purposes; ask your health care provider or pharmacist if you have questions. COMMON BRAND NAME(S): CONTRAVE What should I tell my health care provider before I take this medicine? They need to know if you have any of these conditions: -an eating disorder, such as anorexia or bulimia -bipolar disorder -diabetes -depression -drug abuse or addiction -glaucoma -head injury -heart disease -high blood pressure -history of a tumor or infection of your brain or spine -history of stroke -history of irregular heartbeat -if you often drink alcohol -kidney disease -liver disease -schizophrenia -seizures -suicidal thoughts, plans, or attempt; a previous suicide attempt by you or a family member -an unusual or allergic reaction to bupropion, naltrexone, other medicines, foods, dyes, or preservatives -breast-feeding -pregnant or trying to become pregnant How should I use this medicine? Take this medicine by mouth with a glass of water. Follow the directions on the prescription label. Take this medicine in  the morning and in the evenings as directed by your healthcare professional. Bonita Quin can take it with or without food. Do not take with high-fat meals as this may increase your risk of seizures.  Do not crush, chew, or cut these tablets. Do not take your medicine more often than directed. Do not stop taking this medicine suddenly except upon the advice of your doctor. A special MedGuide will be given to you by the pharmacist with each prescription and refill. Be sure to read this information carefully each time. Talk to your pediatrician regarding the use of this medicine in children. Special care may be needed. Overdosage: If you think you have taken too much of this medicine contact a poison control center or emergency room at once. NOTE: This medicine is only for you. Do not share this medicine with others. What if I miss a dose? If you miss a dose, skip the missed dose and take your next tablet at the regular time. Do not take double or extra doses. What may interact with this medicine? Do not take this medicine with any of the following medications: -any prescription or street opioid drug like codeine, heroin, methadone -linezolid -MAOIs like Carbex, Eldepryl, Marplan, Nardil, and Parnate -methylene blue (injected into a vein) -other medicines that contain bupropion like Zyban or Wellbutrin This medicine may also interact with the following medications: -alcohol -certain medicines for anxiety or sleep -certain medicines for blood pressure like metoprolol, propranolol -certain medicines for depression or psychotic disturbances -certain medicines for HIV or AIDS like efavirenz, lopinavir, nelfinavir, ritonavir -certain medicines for irregular heart beat like propafenone, flecainide -certain medicines for Parkinson's disease like amantadine, levodopa -certain medicines for seizures like carbamazepine, phenytoin, phenobarbital -cimetidine -clopidogrel -cyclophosphamide -digoxin -disulfiram -furazolidone -isoniazid -nicotine -orphenadrine -procarbazine -steroid medicines like prednisone or cortisone -stimulant medicines for attention disorders, weight loss, or to stay  awake -tamoxifen -theophylline -thioridazine -thiotepa -ticlopidine -tramadol -warfarin This list may not describe all possible interactions. Give your health care provider a list of all the medicines, herbs, non-prescription drugs, or dietary supplements you use. Also tell them if you smoke, drink alcohol, or use illegal drugs. Some items may interact with your medicine. What should I watch for while using this medicine? This medicine is intended to be used in addition to a healthy diet and appropriate exercise. The best results are achieved this way. Do not increase or in any way change your dose without consulting your doctor or health care professional. Do not take this medicine with other prescription or over-the-counter weight loss products without consulting your doctor or health care professional. Your doctor should tell you to stop taking this medicine if you do not lose a certain amount of weight within the first 12 weeks of treatment. Visit your doctor or health care professional for regular checkups. Your doctor may order blood tests or other tests to see how you are doing. This medicine may affect blood sugar levels. If you have diabetes, check with your doctor or health care professional before you change your diet or the dose of your diabetic medicine. Patients and their families should watch out for new or worsening depression or thoughts of suicide. Also watch out for sudden changes in feelings such as feeling anxious, agitated, panicky, irritable, hostile, aggressive, impulsive, severely restless, overly excited and hyperactive, or not being able to sleep. If this happens, especially at the beginning of treatment or after a change in dose, call your health care professional. Avoid alcoholic drinks while taking this  medicine. Drinking large amounts of alcoholic beverages, using sleeping or anxiety medicines, or quickly stopping the use of these agents while taking this medicine may  increase your risk for a seizure. What side effects may I notice from receiving this medicine? Side effects that you should report to your doctor or health care professional as soon as possible: -allergic reactions like skin rash, itching or hives, swelling of the face, lips, or tongue -breathing problems -changes in vision -confusion -elevated mood, decreased need for sleep, racing thoughts, impulsive behavior -fast or irregular heartbeat -hallucinations, loss of contact with reality -increased blood pressure -redness, blistering, peeling or loosening of the skin, including inside the mouth -seizures -signs and symptoms of liver injury like dark yellow or brown urine; general ill feeling or flu-like symptoms; light-colored stools; loss of appetite; nausea; right upper belly pain; unusually weak or tired; yellowing of the eyes or skin -suicidal thoughts or other mood changes -vomiting Side effects that usually do not require medical attention (report to your doctor or health care professional if they continue or are bothersome): -constipation -headache -loss of appetite -indigestion, stomach upset -tremors This list may not describe all possible side effects. Call your doctor for medical advice about side effects. You may report side effects to FDA at 1-800-FDA-1088. Where should I keep my medicine? Keep out of the reach of children. Store at room temperature between 15 and 30 degrees C (59 and 86 degrees F). Throw away any unused medicine after the expiration date. NOTE: This sheet is a summary. It may not cover all possible information. If you have questions about this medicine, talk to your doctor, pharmacist, or health care provider.  2018 Elsevier/Gold Standard (2016-05-17 13:42:58)

## 2018-09-06 ENCOUNTER — Other Ambulatory Visit: Payer: Self-pay | Admitting: Obstetrics and Gynecology

## 2018-09-07 DIAGNOSIS — E669 Obesity, unspecified: Secondary | ICD-10-CM | POA: Insufficient documentation

## 2018-09-07 DIAGNOSIS — R600 Localized edema: Secondary | ICD-10-CM | POA: Insufficient documentation

## 2018-09-07 NOTE — Assessment & Plan Note (Signed)
Will not increase HCTZ as BPs are low normal. Work on compression, elevation, salt restriction, weight loss. Continue 12.5 mg dose.

## 2018-09-07 NOTE — Assessment & Plan Note (Signed)
Interested in weight loss medications. Discussed the importance of establishing good lifestyle practices prior to initiating any short term weight loss medications. Information given for belviq and contrave today. Diet and exercise reviewed

## 2018-09-11 ENCOUNTER — Ambulatory Visit: Payer: Managed Care, Other (non HMO) | Admitting: Obstetrics and Gynecology

## 2018-09-11 NOTE — Progress Notes (Deleted)
      Postoperative Follow-up Patient presents post op from laparoscopic BTL via Falope ring application 6 weeks ago for undesired fertility.  Subjective: Patient reports marked improvement in her preop symptoms. Eating a regular diet without difficulty. The patient is not having any pain.  Activity: normal activities of daily living.  Objective: Vitals: ***  Office Visit on 09/04/2018  Component Date Value Ref Range Status  . Trichomonas Exam 09/04/2018 Negative  Negative Final  . Yeast Exam 09/04/2018 Positive* Negative Final  . Clue Cell Exam 09/04/2018 Positive* Negative Final   General: NAD Pulmonary: no increased work of breathing Abdomen: soft, non-tender, non-distended, incision(s) D/C/I GU: normal external female genitalia {Blank single:19197::"normal cervix, no CMT, uterus normal in shape and contour, no adnexal tenderness or masses","vaginal cuff intact, well healed"} Extremities: no edema Neurologic: normal gait  Assessment: 27 y.o. s/p laparoscopic BTL stable  Plan: Patient has done well after surgery with no apparent complications.  I have discussed the post-operative course to date, and the expected progress moving forward.  The patient understands what complications to be concerned about.  I will see the patient in routine follow up, or sooner if needed.    Activity plan: No restriction.   Vena Austria, MD, Evern Core [facility], [facility] 09/11/2018, 8:03 AM

## 2018-09-18 ENCOUNTER — Telehealth: Payer: Self-pay

## 2018-09-18 ENCOUNTER — Encounter: Payer: Self-pay | Admitting: Family Medicine

## 2018-09-18 ENCOUNTER — Ambulatory Visit (INDEPENDENT_AMBULATORY_CARE_PROVIDER_SITE_OTHER): Payer: Medicaid Other | Admitting: Family Medicine

## 2018-09-18 VITALS — BP 136/84 | HR 69 | Temp 98.7°F | Ht 65.5 in | Wt 302.3 lb

## 2018-09-18 DIAGNOSIS — Z0000 Encounter for general adult medical examination without abnormal findings: Secondary | ICD-10-CM | POA: Diagnosis not present

## 2018-09-18 DIAGNOSIS — R600 Localized edema: Secondary | ICD-10-CM | POA: Diagnosis not present

## 2018-09-18 DIAGNOSIS — Z6841 Body Mass Index (BMI) 40.0 and over, adult: Secondary | ICD-10-CM

## 2018-09-18 LAB — UA/M W/RFLX CULTURE, ROUTINE
Bilirubin, UA: NEGATIVE
Glucose, UA: NEGATIVE
Ketones, UA: NEGATIVE
Nitrite, UA: NEGATIVE
Protein, UA: NEGATIVE
Specific Gravity, UA: 1.02 (ref 1.005–1.030)
Urobilinogen, Ur: 1 mg/dL (ref 0.2–1.0)
pH, UA: 7 (ref 5.0–7.5)

## 2018-09-18 LAB — MICROSCOPIC EXAMINATION

## 2018-09-18 MED ORDER — LORCASERIN HCL 10 MG PO TABS: 10.0000 mg | ORAL_TABLET | Freq: Two times a day (BID) | ORAL | 0 refills | Status: DC

## 2018-09-18 NOTE — Assessment & Plan Note (Signed)
Long discussion today about lifestyle modifications, will try belviq and if not covered will start with wellbutrin in addition to diet and exercise. Cut out sugary beverages, mostly vegetables and lean protein.

## 2018-09-18 NOTE — Telephone Encounter (Signed)
Prior authorization for Belviq was initiated via [email or web address]. Key: [id number]   Prior authorization was approved via [email or web address]

## 2018-09-18 NOTE — Patient Instructions (Signed)
Follow up in 1 month   

## 2018-09-18 NOTE — Assessment & Plan Note (Signed)
Stable, continue sodium restriction, leg elevation, compression, and HCTZ

## 2018-09-18 NOTE — Progress Notes (Signed)
BP 136/84   Pulse 69   Temp 98.7 F (37.1 C) (Oral)   Ht 5' 5.5" (1.664 m)   Wt (!) 302 lb 4.8 oz (137.1 kg)   LMP 09/16/2018 (Exact Date)   SpO2 98%   BMI 49.54 kg/m    Subjective:    Patient ID: Regina Chandler, female    DOB: 01-08-1991, 27 y.o.   MRN: [id number]  HPI: Regina Chandler is a 27 y.o. female presenting on 09/18/2018 for comprehensive medical examination. Current medical complaints include:none  Interested in weight loss medication. Trying to reduce daily calories and be more active without benefit. Has tried hydroxycut in the past which didn't do well either.   Takes HCTZ for LE edema with mild relief. No side effects, BPs stable. Sometimes wears compression hose, tries to reduce sodium in diet.   She currently lives with: Menopausal Symptoms: no  Depression Screen done today and results listed below:  Depression screen [facility] 2/9 09/18/2018 09/04/2018  Decreased Interest 0 1  Down, Depressed, Hopeless 0 1  PHQ - 2 Score 0 2  Altered sleeping 1 3  Tired, decreased energy 1 3  Change in appetite 0 1  Feeling bad or failure about yourself  0 0  Trouble concentrating 0 1  Moving slowly or fidgety/restless 0 0  Suicidal thoughts 0 0  PHQ-9 Score 2 10    The patient does not have a history of falls. I did not complete a risk assessment for falls. A plan of care for falls was not documented.   Past Medical History:  Past Medical History:  Diagnosis Date  . Anemia   . Anxiety   . Bipolar 1 disorder (HCC)   . Depression   . GERD (gastroesophageal reflux disease)    NO MEDS  . Headache   . Hepatitis B affecting pregnancy 2007   No current treatment required, titers show up negative  . Hypertension     Surgical History:  Past Surgical History:  Procedure Laterality Date  . LAPAROSCOPIC TUBAL LIGATION N/A 07/30/2018   Procedure: LAPAROSCOPIC TUBAL LIGATION;  Surgeon: Vena Austria, MD;  Location: [facility] ORS;  Service: Gynecology;  Laterality: N/A;   . NO PAST SURGERIES    . NO PAST SURGERIES    . TUBAL LIGATION  07/30/2018   [facility])    Medications:  Current Outpatient Medications on File Prior to Visit  Medication Sig  . hydrochlorothiazide (HYDRODIURIL) 12.5 MG tablet TAKE 1 TABLET BY MOUTH EVERY DAY  . ibuprofen (ADVIL,MOTRIN) 600 MG tablet Take 1 tablet (600 mg total) by mouth every 6 (six) hours as needed.  . Prenatal Vit-Fe Fumarate-FA (PRENATAL MULTIVITAMIN) TABS tablet Take 1 tablet by mouth daily.    No current facility-administered medications on file prior to visit.     Allergies:  Allergies  Allergen Reactions  . Penicillins Swelling    Has patient had a PCN reaction causing immediate rash, facial/tongue/throat swelling, SOB or lightheadedness with hypotension: No Has patient had a PCN reaction causing severe rash involving mucus membranes or skin necrosis: No Has patient had a PCN reaction that required hospitalization No Has patient had a PCN reaction occurring within the last 10 years: No If all of the above answers are "NO", then may proceed with Cephalosporin use.    Social History:  Social History   Socioeconomic History  . Marital status: Single    Spouse name: Not on file  . Number of children: Not on file  .  Years of education: Not on file  . Highest education level: Not on file  Occupational History  . Not on file  Social Needs  . Financial resource strain: Not on file  . Food insecurity:    Worry: Not on file    Inability: Not on file  . Transportation needs:    Medical: Not on file    Non-medical: Not on file  Tobacco Use  . Smoking status: Never Smoker  . Smokeless tobacco: Never Used  Substance and Sexual Activity  . Alcohol use: Yes    Comment: RARE  . Drug use: No  . Sexual activity: Yes    Birth control/protection: Surgical    Comment: Tubal ligation  Lifestyle  . Physical activity:    Days per week: Not on file    Minutes per session: Not on file  . Stress:  Not on file  Relationships  . Social connections:    Talks on phone: Not on file    Gets together: Not on file    Attends religious service: Not on file    Active member of club or organization: Not on file    Attends meetings of clubs or organizations: Not on file    Relationship status: Not on file  . Intimate partner violence:    Fear of current or ex partner: Not on file    Emotionally abused: Not on file    Physically abused: Not on file    Forced sexual activity: Not on file  Other Topics Concern  . Not on file  Social History Narrative  . Not on file   Social History   Tobacco Use  Smoking Status Never Smoker  Smokeless Tobacco Never Used   Social History   Substance and Sexual Activity  Alcohol Use Yes   Comment: RARE    Family History:  Family History  Problem Relation Age of Onset  . Hypertension Sister   . Hypertension Brother   . Hypertension Maternal Grandmother   . Diabetes Maternal Grandmother   . Hypertension Maternal Grandfather   . Hypertension Paternal Grandmother   . Hypertension Paternal Grandfather   . Cancer Neg Hx   . Heart disease Neg Hx   . Stroke Neg Hx     Past medical history, surgical history, medications, allergies, family history and social history reviewed with patient today and changes made to appropriate areas of the chart.   Review of Systems - General ROS: positive for  - weight gain Psychological ROS: negative Ophthalmic ROS: negative ENT ROS: negative Allergy and Immunology ROS: negative Hematological and Lymphatic ROS: negative Endocrine ROS: negative Breast ROS: negative for breast lumps Respiratory ROS: no cough, shortness of breath, or wheezing Cardiovascular ROS: no chest pain or dyspnea on exertion Gastrointestinal ROS: no abdominal pain, change in bowel habits, or black or bloody stools Genito-Urinary ROS: no dysuria, trouble voiding, or hematuria Musculoskeletal ROS: negative Neurological ROS: no TIA or  stroke symptoms Dermatological ROS: negative All other ROS negative except what is listed above and in the HPI.      Objective:    BP 136/84   Pulse 69   Temp 98.7 F (37.1 C) (Oral)   Ht 5' 5.5" (1.664 m)   Wt (!) 302 lb 4.8 oz (137.1 kg)   LMP 09/16/2018 (Exact Date)   SpO2 98%   BMI 49.54 kg/m   Wt Readings from Last 3 Encounters:  09/18/18 (!) 302 lb 4.8 oz (137.1 kg)  09/04/18 (!) 306 lb (138.8  kg)  08/06/18 294 lb (133.4 kg)    Physical Exam  Constitutional: She is oriented to person, place, and time. She appears well-developed and well-nourished. No distress.  Obese  HENT:  Head: Atraumatic.  Right Ear: External ear normal.  Left Ear: External ear normal.  Nose: Nose normal.  Mouth/Throat: Oropharynx is clear and moist. No oropharyngeal exudate.  Eyes: Pupils are equal, round, and reactive to light. Conjunctivae are normal. No scleral icterus.  Neck: Normal range of motion. Neck supple. No thyromegaly present.  Cardiovascular: Normal rate, regular rhythm, normal heart sounds and intact distal pulses.  Pulmonary/Chest: Effort normal and breath sounds normal. No respiratory distress.  Abdominal: Soft. Bowel sounds are normal. She exhibits no mass. There is no tenderness.  Genitourinary:  Genitourinary Comments: Exam done through GYN  Musculoskeletal: Normal range of motion. She exhibits no edema or tenderness.  Lymphadenopathy:    She has no cervical adenopathy.  Neurological: She is alert and oriented to person, place, and time. No cranial nerve deficit.  Skin: Skin is warm and dry. No rash noted.  Psychiatric: She has a normal mood and affect. Her behavior is normal.  Nursing note and vitals reviewed.   Results for orders placed or performed in visit on 09/04/18  WET PREP FOR TRICH, YEAST, CLUE  Result Value Ref Range   Trichomonas Exam Negative Negative   Yeast Exam Positive (A) Negative   Clue Cell Exam Positive (A) Negative      Assessment & Plan:    Problem List Items Addressed This Visit      Other   BMI 45.0-49.9, adult (HCC)    Long discussion today about lifestyle modifications, will try belviq and if not covered will start with wellbutrin in addition to diet and exercise. Cut out sugary beverages, mostly vegetables and lean protein.       Relevant Medications   Lorcaserin HCl 10 MG TABS   Bilateral leg edema - Primary    Stable, continue sodium restriction, leg elevation, compression, and HCTZ       Other Visit Diagnoses    Annual physical exam       Relevant Orders   CBC with Differential/Platelet   Comprehensive metabolic panel   Lipid Panel w/o Chol/HDL Ratio   TSH   UA/M w/rflx Culture, Routine       Follow up plan: Return in about 4 weeks (around 10/16/2018) for weight check.   LABORATORY TESTING:  - Pap smear: up to date  IMMUNIZATIONS:   - Tdap: Tetanus vaccination status reviewed: last tetanus booster within 10 years. - Influenza: Up to date  PATIENT COUNSELING:   Advised to take 1 mg of folate supplement per day if capable of pregnancy.   Sexuality: Discussed sexually transmitted diseases, partner selection, use of condoms, avoidance of unintended pregnancy  and contraceptive alternatives.   Advised to avoid cigarette smoking.  I discussed with the patient that most people either abstain from alcohol or drink within safe limits (<=14/week and <=4 drinks/occasion for males, <=7/weeks and <= 3 drinks/occasion for females) and that the risk for alcohol disorders and other health effects rises proportionally with the number of drinks per week and how often a drinker exceeds daily limits.  Discussed cessation/primary prevention of drug use and availability of treatment for abuse.   Diet: Encouraged to adjust caloric intake to maintain  or achieve ideal body weight, to reduce intake of dietary saturated fat and total fat, to limit sodium intake by avoiding high sodium foods  and not adding table salt, and  to maintain adequate dietary potassium and calcium preferably from fresh fruits, vegetables, and low-fat dairy products.    stressed the importance of regular exercise  Injury prevention: Discussed safety belts, safety helmets, smoke detector, smoking near bedding or upholstery.   Dental health: Discussed importance of regular tooth brushing, flossing, and dental visits.    NEXT PREVENTATIVE PHYSICAL DUE IN 1 YEAR. Return in about 4 weeks (around 10/16/2018) for weight check.

## 2018-09-19 LAB — CBC WITH DIFFERENTIAL/PLATELET
Basophils Absolute: 0 10*3/uL (ref 0.0–0.2)
Basos: 1 %
EOS (ABSOLUTE): 0.1 10*3/uL (ref 0.0–0.4)
Eos: 1 %
Hematocrit: 33.3 % — ABNORMAL LOW (ref 34.0–46.6)
Hemoglobin: 11.4 g/dL (ref 11.1–15.9)
Immature Grans (Abs): 0 10*3/uL (ref 0.0–0.1)
Immature Granulocytes: 1 %
Lymphocytes Absolute: 1.9 10*3/uL (ref 0.7–3.1)
Lymphs: 36 %
MCH: 29 pg (ref 26.6–33.0)
MCHC: 34.2 g/dL (ref 31.5–35.7)
MCV: 85 fL (ref 79–97)
Monocytes Absolute: 0.4 10*3/uL (ref 0.1–0.9)
Monocytes: 7 %
Neutrophils Absolute: 2.9 10*3/uL (ref 1.4–7.0)
Neutrophils: 54 %
Platelets: 268 10*3/uL (ref 150–450)
RBC: 3.93 x10E6/uL (ref 3.77–5.28)
RDW: 13.1 % (ref 12.3–15.4)
WBC: 5.3 10*3/uL (ref 3.4–10.8)

## 2018-09-19 LAB — COMPREHENSIVE METABOLIC PANEL
ALT: 52 IU/L — ABNORMAL HIGH (ref 0–32)
AST: 28 IU/L (ref 0–40)
Albumin/Globulin Ratio: 1.4 (ref 1.2–2.2)
Albumin: 4.1 g/dL (ref 3.5–5.5)
Alkaline Phosphatase: 107 IU/L (ref 39–117)
BUN/Creatinine Ratio: 15 (ref 9–23)
BUN: 13 mg/dL (ref 6–20)
Bilirubin Total: 0.4 mg/dL (ref 0.0–1.2)
CO2: 23 mmol/L (ref 20–29)
Calcium: 9.2 mg/dL (ref 8.7–10.2)
Chloride: 102 mmol/L (ref 96–106)
Creatinine, Ser: 0.84 mg/dL (ref 0.57–1.00)
GFR calc Af Amer: 109 mL/min/{1.73_m2} (ref >59)
GFR calc non Af Amer: 94 mL/min/{1.73_m2} (ref >59)
Globulin, Total: 3 g/dL (ref 1.5–4.5)
Glucose: 79 mg/dL (ref 65–99)
Potassium: 3.8 mmol/L (ref 3.5–5.2)
Sodium: 142 mmol/L (ref 134–144)
Total Protein: 7.1 g/dL (ref 6.0–8.5)

## 2018-09-19 LAB — LIPID PANEL W/O CHOL/HDL RATIO
Cholesterol, Total: 124 mg/dL (ref 100–199)
HDL: 42 mg/dL (ref >39)
LDL Calculated: 63 mg/dL (ref 0–99)
Triglycerides: 96 mg/dL (ref 0–149)
VLDL Cholesterol Cal: 19 mg/dL (ref 5–40)

## 2018-09-19 LAB — TSH: TSH: 1.86 u[IU]/mL (ref 0.450–4.500)

## 2018-10-16 ENCOUNTER — Encounter: Payer: Self-pay | Admitting: Family Medicine

## 2018-10-16 ENCOUNTER — Ambulatory Visit (INDEPENDENT_AMBULATORY_CARE_PROVIDER_SITE_OTHER): Payer: Managed Care, Other (non HMO) | Admitting: Family Medicine

## 2018-10-16 VITALS — BP 131/82 | HR 81 | Temp 98.4°F | Wt 301.1 lb

## 2018-10-16 DIAGNOSIS — Z6841 Body Mass Index (BMI) 40.0 and over, adult: Secondary | ICD-10-CM

## 2018-10-16 MED ORDER — LORCASERIN HCL 10 MG PO TABS: 10.0000 mg | ORAL_TABLET | Freq: Two times a day (BID) | ORAL | 2 refills | Status: DC

## 2018-10-16 NOTE — Progress Notes (Signed)
BP 131/82   Pulse 81   Temp 98.4 F (36.9 C) (Oral)   Wt (!) 301 lb 1.6 oz (136.6 kg)   LMP 09/16/2018 (Exact Date)   SpO2 97%   BMI 49.34 kg/m    Subjective:    Patient ID: Regina Chandler, female    DOB: [birth year], 27 y.o.   MRN: [id number]  HPI: Regina Chandler is a 27 y.o. female  Chief Complaint  Patient presents with  . Weight Check    4 week f/up   Here today for 1 month weight f/u after starting belviq. Not noticing a huge difference in appetite, but maybe some decrease. Eating better and has a very active job. Down 5 lb so far. Tolerating medication well without side effects.  Relevant past medical, surgical, family and social history reviewed and updated as indicated. Interim medical history since our last visit reviewed. Allergies and medications reviewed and updated.  Review of Systems  Per HPI unless specifically indicated above     Objective:    BP 131/82   Pulse 81   Temp 98.4 F (36.9 C) (Oral)   Wt (!) 301 lb 1.6 oz (136.6 kg)   LMP 09/16/2018 (Exact Date)   SpO2 97%   BMI 49.34 kg/m   Wt Readings from Last 3 Encounters:  10/16/18 (!) 301 lb 1.6 oz (136.6 kg)  09/18/18 (!) 302 lb 4.8 oz (137.1 kg)  09/04/18 (!) 306 lb (138.8 kg)    Physical Exam  Constitutional: She is oriented to person, place, and time. She appears well-developed and well-nourished.  HENT:  Head: Atraumatic.  Eyes: Conjunctivae and EOM are normal.  Neck: Normal range of motion. Neck supple.  Cardiovascular: Normal rate and regular rhythm.  Pulmonary/Chest: Effort normal and breath sounds normal.  Musculoskeletal: Normal range of motion.  Neurological: She is alert and oriented to person, place, and time.  Skin: Skin is warm and dry.  Psychiatric: She has a normal mood and affect. Her behavior is normal.  Nursing note and vitals reviewed.   Results for orders placed or performed in visit on 09/18/18  Microscopic Examination  Result Value Ref Range   WBC, UA  6-10 (A) 0 - 5 /hpf   RBC, UA 0-2 0 - 2 /hpf   Epithelial Cells (non renal) 0-10 0 - 10 /hpf   Renal Epithel, UA 0-10 (A) None seen /hpf   Bacteria, UA Few None seen/Few   Yeast, UA Present None seen  CBC with Differential/Platelet  Result Value Ref Range   WBC 5.3 3.4 - 10.8 x10E3/uL   RBC 3.93 3.77 - 5.28 x10E6/uL   Hemoglobin 11.4 11.1 - 15.9 g/dL   Hematocrit 40.9 (L) 81.1 - 46.6 %   MCV 85 79 - 97 fL   MCH 29.0 26.6 - 33.0 pg   MCHC 34.2 31.5 - 35.7 g/dL   RDW 91.4 78.2 - 95.6 %   Platelets 268 150 - 450 x10E3/uL   Neutrophils 54 Not Estab. %   Lymphs 36 Not Estab. %   Monocytes 7 Not Estab. %   Eos 1 Not Estab. %   Basos 1 Not Estab. %   Neutrophils Absolute 2.9 1.4 - 7.0 x10E3/uL   Lymphocytes Absolute 1.9 0.7 - 3.1 x10E3/uL   Monocytes Absolute 0.4 0.1 - 0.9 x10E3/uL   EOS (ABSOLUTE) 0.1 0.0 - 0.4 x10E3/uL   Basophils Absolute 0.0 0.0 - 0.2 x10E3/uL   Immature Granulocytes 1 Not Estab. %   Immature Grans (  Abs) 0.0 0.0 - 0.1 x10E3/uL  Comprehensive metabolic panel  Result Value Ref Range   Glucose 79 65 - 99 mg/dL   BUN 13 6 - 20 mg/dL   Creatinine, Ser 4.54 0.57 - 1.00 mg/dL   GFR calc non Af Amer 94 >59 mL/min/1.73   GFR calc Af Amer 109 >59 mL/min/1.73   BUN/Creatinine Ratio 15 9 - 23   Sodium 142 134 - 144 mmol/L   Potassium 3.8 3.5 - 5.2 mmol/L   Chloride 102 96 - 106 mmol/L   CO2 23 20 - 29 mmol/L   Calcium 9.2 8.7 - 10.2 mg/dL   Total Protein 7.1 6.0 - 8.5 g/dL   Albumin 4.1 3.5 - 5.5 g/dL   Globulin, Total 3.0 1.5 - 4.5 g/dL   Albumin/Globulin Ratio 1.4 1.2 - 2.2   Bilirubin Total 0.4 0.0 - 1.2 mg/dL   Alkaline Phosphatase 107 39 - 117 IU/L   AST 28 0 - 40 IU/L   ALT 52 (H) 0 - 32 IU/L  Lipid Panel w/o Chol/HDL Ratio  Result Value Ref Range   Cholesterol, Total 124 100 - 199 mg/dL   Triglycerides 96 0 - 149 mg/dL   HDL 42 >09 mg/dL   VLDL Cholesterol Cal 19 5 - 40 mg/dL   LDL Calculated 63 0 - 99 mg/dL  TSH  Result Value Ref Range   TSH 1.860  0.450 - 4.500 uIU/mL  UA/M w/rflx Culture, Routine  Result Value Ref Range   Specific Gravity, UA 1.020 1.005 - 1.030   pH, UA 7.0 5.0 - 7.5   Color, UA Yellow Yellow   Appearance Ur Hazy (A) Clear   Leukocytes, UA Trace (A) Negative   Protein, UA Negative Negative/Trace   Glucose, UA Negative Negative   Ketones, UA Negative Negative   RBC, UA Trace (A) Negative   Bilirubin, UA Negative Negative   Urobilinogen, Ur 1.0 0.2 - 1.0 mg/dL   Nitrite, UA Negative Negative   Microscopic Examination See below:       Assessment & Plan:   Problem List Items Addressed This Visit      Other   BMI 45.0-49.9, adult (HCC)   Relevant Medications   Lorcaserin HCl 10 MG TABS   Obesity - Primary    Down 5 lb, congratulated progress. Continue belviq, discussed drilling down on diet and regimented exercise.       Relevant Medications   Lorcaserin HCl 10 MG TABS       Follow up plan: Return in about 3 months (around 01/16/2019) for Weight check.

## 2018-10-25 NOTE — Assessment & Plan Note (Signed)
Down 5 lb, congratulated progress. Continue belviq, discussed drilling down on diet and regimented exercise.

## 2018-12-03 ENCOUNTER — Other Ambulatory Visit: Payer: Self-pay

## 2018-12-03 ENCOUNTER — Emergency Department
Admission: EM | Admit: 2018-12-03 | Discharge: 2018-12-03 | Disposition: A | Discharge status: Home / Self Care | Payer: Managed Care, Other (non HMO) | Attending: Emergency Medicine | Admitting: Emergency Medicine

## 2018-12-03 ENCOUNTER — Encounter: Payer: Self-pay | Admitting: Emergency Medicine

## 2018-12-03 DIAGNOSIS — J111 Influenza due to unidentified influenza virus with other respiratory manifestations: Secondary | ICD-10-CM | POA: Diagnosis not present

## 2018-12-03 DIAGNOSIS — J101 Influenza due to other identified influenza virus with other respiratory manifestations: Secondary | ICD-10-CM | POA: Diagnosis not present

## 2018-12-03 DIAGNOSIS — R509 Fever, unspecified: Secondary | ICD-10-CM | POA: Diagnosis not present

## 2018-12-03 DIAGNOSIS — M791 Myalgia, unspecified site: Secondary | ICD-10-CM | POA: Diagnosis present

## 2018-12-03 DIAGNOSIS — R51 Headache: Secondary | ICD-10-CM | POA: Diagnosis not present

## 2018-12-03 MED ORDER — OSELTAMIVIR PHOSPHATE 75 MG PO CAPS: 75.0000 mg | ORAL_CAPSULE | Freq: Two times a day (BID) | ORAL | 0 refills | Status: DC

## 2018-12-03 NOTE — ED Provider Notes (Signed)
[facility] Emergency Department Provider Note  ____________________________________________  Time seen: Approximately 5:11 PM  I have reviewed the triage vital signs and the nursing notes.   HISTORY  Chief Complaint flu like symptoms    HPI Regina Chandler is a 27 y.o. female who presents the emergency department with her husband for influenza testing.  Patient states that she has 4 children, all of them have come down with symptoms and were tested at the pediatrician and tested positive for flu a or flu B.   Patient is having body aches, low-grade fever, headache, chills.  Patient is concerned that she has influenza as well.  She is here for testing/treatment.  Patient denies any sore throat, nasal congestion, abdominal pain, nausea vomiting, diarrhea or constipation.   Past Medical History:  Diagnosis Date  . Anemia   . Anxiety   . Bipolar 1 disorder (HCC)   . Depression   . GERD (gastroesophageal reflux disease)    NO MEDS  . Headache   . Hepatitis B affecting pregnancy 2007   No current treatment required, titers show up negative  . Hypertension     Patient Active Problem List   Diagnosis Date Noted  . Bilateral leg edema 09/07/2018  . Obesity 09/07/2018  . Chlamydia infection affecting pregnancy 04/23/2018  . Elevated blood pressure affecting pregnancy in third trimester, antepartum 04/03/2018  . Back pain affecting pregnancy in second trimester 12/19/2017  . Chronic hepatitis B affecting antepartum care of mother [facility]) 12/19/2017  . BMI 45.0-49.9, adult (HCC) 12/19/2017  . Headache in pregnancy, antepartum, third trimester 11/24/2016    Past Surgical History:  Procedure Laterality Date  . LAPAROSCOPIC TUBAL LIGATION N/A 07/30/2018   Procedure: LAPAROSCOPIC TUBAL LIGATION;  Surgeon: Vena Austria, MD;  Location: [facility] ORS;  Service: Gynecology;  Laterality: N/A;  . NO PAST SURGERIES    . NO PAST SURGERIES    . TUBAL LIGATION   07/30/2018   [facility])    Prior to Admission medications   Medication Sig Start Date End Date Taking? Authorizing Provider  hydrochlorothiazide (HYDRODIURIL) 12.5 MG tablet TAKE 1 TABLET BY MOUTH EVERY DAY 09/07/18   Vena Austria, MD  ibuprofen (ADVIL,MOTRIN) 600 MG tablet Take 1 tablet (600 mg total) by mouth every 6 (six) hours as needed. 07/30/18   Vena Austria, MD  Lorcaserin HCl 10 MG TABS Take 10 mg by mouth 2 (two) times daily. 10/16/18   Particia Nearing, PA-C  oseltamivir (TAMIFLU) 75 MG capsule Take 1 capsule (75 mg total) by mouth 2 (two) times daily. 12/03/18   Jezabelle Chisolm, Delorise Royals, PA-C  Prenatal Vit-Fe Fumarate-FA (PRENATAL MULTIVITAMIN) TABS tablet Take 1 tablet by mouth daily.     [provider]    Allergies Penicillins  Family History  Problem Relation Age of Onset  . Hypertension Sister   . Hypertension Brother   . Hypertension Maternal Grandmother   . Diabetes Maternal Grandmother   . Hypertension Maternal Grandfather   . Hypertension Paternal Grandmother   . Hypertension Paternal Grandfather   . Cancer Neg Hx   . Heart disease Neg Hx   . Stroke Neg Hx     Social History Social History   Tobacco Use  . Smoking status: Never Smoker  . Smokeless tobacco: Never Used  Substance Use Topics  . Alcohol use: Yes    Comment: RARE  . Drug use: No     Review of Systems  Constitutional: Low-grade fever/chills.  Positive for body aches.  Eyes: No visual changes. No discharge ENT: No upper respiratory complaints. Cardiovascular: no chest pain. Respiratory: no cough. No SOB. Gastrointestinal: No abdominal pain.  No nausea, no vomiting.  No diarrhea.  No constipation. Musculoskeletal: Negative for musculoskeletal pain. Skin: Negative for rash, abrasions, lacerations, ecchymosis. Neurological: Positive for headache but denies focal weakness or numbness. 10-point ROS otherwise  negative.  ____________________________________________   PHYSICAL EXAM:  VITAL SIGNS: ED Triage Vitals  Enc Vitals Group     BP 12/03/18 1702 (!) 150/100     Pulse Rate 12/03/18 1702 74     Resp 12/03/18 1702 16     Temp 12/03/18 1702 98.5 F (36.9 C)     Temp Source 12/03/18 1702 Oral     SpO2 12/03/18 1702 99 %     Weight 12/03/18 1700 300 lb (136.1 kg)     Height 12/03/18 1700 5\' 6"  (1.676 m)     Head Circumference --      Peak Flow --      Pain Score 12/03/18 1700 7     Pain Loc --      Pain Edu? --      Excl. in GC? --      Constitutional: Alert and oriented. Well appearing and in no acute distress. Eyes: Conjunctivae are normal. PERRL. EOMI. Head: Atraumatic. ENT:      Ears: EACs and TMs unremarkable bilaterally.      Nose: Mild clear congestion/rhinnorhea.      Mouth/Throat: Mucous membranes are moist.  Oropharynx is nonerythematous and nonedematous.  Uvula is midline. Neck: No stridor.  Neck is supple full range of motion Hematological/Lymphatic/Immunilogical: No cervical lymphadenopathy. Cardiovascular: Normal rate, regular rhythm. Normal S1 and S2.  Good peripheral circulation. Respiratory: Normal respiratory effort without tachypnea or retractions. Lungs CTAB. Good air entry to the bases with no decreased or absent breath sounds. Gastrointestinal: Bowel sounds 4 quadrants. Soft and nontender to palpation. No guarding or rigidity. No palpable masses. No distention. No CVA tenderness. Musculoskeletal: Full range of motion to all extremities. No gross deformities appreciated. Neurologic:  Normal speech and language. No gross focal neurologic deficits are appreciated.  Skin:  Skin is warm, dry and intact. No rash noted. Psychiatric: Mood and affect are normal. Speech and behavior are normal. Patient exhibits appropriate insight and judgement.   ____________________________________________   LABS (all labs ordered are listed, but only abnormal results are  displayed)  Labs Reviewed - No data to display ____________________________________________  EKG   ____________________________________________  RADIOLOGY   No results found.  ____________________________________________    PROCEDURES  Procedure(s) performed:    Procedures    Medications - No data to display   ____________________________________________   INITIAL IMPRESSION / ASSESSMENT AND PLAN / ED COURSE  Pertinent labs & imaging results that were available during my care of the patient were reviewed by me and considered in my medical decision making (see chart for details).  Review of the [place] CSRS was performed in accordance of the [facility] prior to dispensing any controlled drugs.      Patient's diagnosis is consistent with influenza.  Patient presents emergency department with body aches, fever, headache.  Patient reports that her 4 children have had similar symptoms over the past 2 days and also been diagnosed with either influenza A or influenza B.  Patient symptoms are consistent with influenza.  At this time, testing will not be performed and patient will be treated empirically.  I will prescribe Tamiflu but I discussed the side  effect profile with the patient.  She verbalizes understanding of same.  She may use or not use Tamiflu at her discretion.  Tylenol and Motrin at home as needed for fever.  Drink plenty of fluids.  Follow-up with primary care as needed..  Patient is given ED precautions to return to the ED for any worsening or new symptoms.     ____________________________________________  FINAL CLINICAL IMPRESSION(S) / ED DIAGNOSES  Final diagnoses:  Influenza      NEW MEDICATIONS STARTED DURING THIS VISIT:  ED Discharge Orders         Ordered    oseltamivir (TAMIFLU) 75 MG capsule  2 times daily     12/03/18 1721              This chart was dictated using voice recognition software/Dragon. Despite best efforts to proofread,  errors can occur which can change the meaning. Any change was purely unintentional.    Racheal PatchesCuthriell, Annetta Deiss D, PA-C 12/03/18 1724    Sharman CheekStafford, Phillip, MD 12/14/18 [phone]

## 2018-12-03 NOTE — ED Triage Notes (Signed)
Pt here for flu treatment. Reports all 4 kids tested positive for A and B strands.  Started today with headache and body aches.  No fever yet per pt.  Ambulatory without distress.  VSS. Here with husband who has same sx.

## 2018-12-07 ENCOUNTER — Emergency Department
Admission: EM | Admit: 2018-12-07 | Discharge: 2018-12-07 | Disposition: A | Discharge status: Home / Self Care | Payer: Managed Care, Other (non HMO) | Attending: Student in an Organized Health Care Education/Training Program | Admitting: Student in an Organized Health Care Education/Training Program

## 2018-12-07 ENCOUNTER — Other Ambulatory Visit: Payer: Self-pay

## 2018-12-07 DIAGNOSIS — Z79899 Other long term (current) drug therapy: Secondary | ICD-10-CM | POA: Insufficient documentation

## 2018-12-07 DIAGNOSIS — B850 Pediculosis due to Pediculus humanus capitis: Secondary | ICD-10-CM | POA: Diagnosis not present

## 2018-12-07 DIAGNOSIS — I10 Essential (primary) hypertension: Secondary | ICD-10-CM | POA: Diagnosis not present

## 2018-12-07 NOTE — Discharge Instructions (Signed)
Follow discharge care instruction use over-the-counter medication as directed.  May repeat application medication in 2 days.

## 2018-12-07 NOTE — ED Provider Notes (Addendum)
[facility] Emergency Department Provider Note   ____________________________________________   First MD Initiated Contact with Patient 12/07/18 1118     (approximate)  I have reviewed the triage vital signs and the nursing notes.   HISTORY  Chief Complaint Head Lice    HPI Regina Chandler is a 27 y.o. female patient presents for possible headlice secondary to exposure by children.  No palliative measures for complaint.  Past Medical History:  Diagnosis Date  . Anemia   . Anxiety   . Bipolar 1 disorder (HCC)   . Depression   . GERD (gastroesophageal reflux disease)    NO MEDS  . Headache   . Hepatitis B affecting pregnancy 2007   No current treatment required, titers show up negative  . Hypertension     Patient Active Problem List   Diagnosis Date Noted  . Bilateral leg edema 09/07/2018  . Obesity 09/07/2018  . Chlamydia infection affecting pregnancy 04/23/2018  . Elevated blood pressure affecting pregnancy in third trimester, antepartum 04/03/2018  . Back pain affecting pregnancy in second trimester 12/19/2017  . Chronic hepatitis B affecting antepartum care of mother [facility](HCC) 12/19/2017  . BMI 45.0-49.9, adult (HCC) 12/19/2017  . Headache in pregnancy, antepartum, third trimester 11/24/2016    Past Surgical History:  Procedure Laterality Date  . LAPAROSCOPIC TUBAL LIGATION N/A 07/30/2018   Procedure: LAPAROSCOPIC TUBAL LIGATION;  Surgeon: Vena AustriaStaebler, Andreas, MD;  Location: [facility] ORS;  Service: Gynecology;  Laterality: N/A;  . NO PAST SURGERIES    . NO PAST SURGERIES    . TUBAL LIGATION  07/30/2018   [facility](Staebler)    Prior to Admission medications   Medication Sig Start Date End Date Taking? Authorizing Provider  hydrochlorothiazide (HYDRODIURIL) 12.5 MG tablet TAKE 1 TABLET BY MOUTH EVERY DAY 09/07/18   Vena AustriaStaebler, Andreas, MD  ibuprofen (ADVIL,MOTRIN) 600 MG tablet Take 1 tablet (600 mg total) by mouth every 6 (six) hours as  needed. 07/30/18   Vena AustriaStaebler, Andreas, MD  Lorcaserin HCl 10 MG TABS Take 10 mg by mouth 2 (two) times daily. 10/16/18   Particia NearingLane, Rachel Elizabeth, PA-C  oseltamivir (TAMIFLU) 75 MG capsule Take 1 capsule (75 mg total) by mouth 2 (two) times daily. 12/03/18   Cuthriell, Delorise RoyalsJonathan D, PA-C  Prenatal Vit-Fe Fumarate-FA (PRENATAL MULTIVITAMIN) TABS tablet Take 1 tablet by mouth daily.     [provider]    Allergies Penicillins  Family History  Problem Relation Age of Onset  . Hypertension Sister   . Hypertension Brother   . Hypertension Maternal Grandmother   . Diabetes Maternal Grandmother   . Hypertension Maternal Grandfather   . Hypertension Paternal Grandmother   . Hypertension Paternal Grandfather   . Cancer Neg Hx   . Heart disease Neg Hx   . Stroke Neg Hx     Social History Social History   Tobacco Use  . Smoking status: Never Smoker  . Smokeless tobacco: Never Used  Substance Use Topics  . Alcohol use: Yes    Comment: RARE  . Drug use: No    Review of Systems  Constitutional: No fever/chills Eyes: No visual changes. ENT: No sore throat. Cardiovascular: Denies chest pain. Respiratory: Denies shortness of breath. Gastrointestinal: No abdominal pain.  No nausea, no vomiting.  No diarrhea.  No constipation. Genitourinary: Negative for dysuria. Musculoskeletal: Negative for back pain. Skin: Negative for rash. Neurological: Negative for headaches, focal weakness or numbness. Psychiatric:Anxiety, bipolar, depression. Endocrine:Hypertension. Allergic/Immunilogical: Penicillin. ____________________________________________   PHYSICAL EXAM:  VITAL SIGNS: ED Triage Vitals  Enc Vitals Group     BP --      Pulse --      Resp --      Temp --      Temp src --      SpO2 --      Weight 12/07/18 1129 299 lb 13.2 oz (136 kg)     Height 12/07/18 1129 5\' 6"  (1.676 m)     Head Circumference --      Peak Flow --      Pain Score 12/07/18 1116 0     Pain Loc --        Pain Edu? --      Excl. in GC? --     Constitutional: Alert and oriented. Well appearing and in no acute distress. Cardiovascular: Normal rate, regular rhythm. Grossly normal heart sounds.  Good peripheral circulation. Respiratory: Normal respiratory effort.  No retractions. Lungs CTAB.  Skin:  Skin is warm, dry and intact. No rash noted.  Active lice and nits in hair. Psychiatric: Mood and affect are normal. Speech and behavior are normal.  ____________________________________________   LABS (all labs ordered are listed, but only abnormal results are displayed)  Labs Reviewed - No data to display ____________________________________________  EKG   ____________________________________________  RADIOLOGY  ED MD interpretation:    Official radiology report(s): No results found.  ____________________________________________   PROCEDURES  Procedure(s) performed: None  Procedures  Critical Care performed: No  ____________________________________________   INITIAL IMPRESSION / ASSESSMENT AND PLAN / ED COURSE  As part of my medical decision making, I reviewed the following data within the electronic MEDICAL RECORD NUMBER    Lice infestation.  Patient given discharge care instruction advised use over-the-counter medications.  Advised follow-up with PCP.      ____________________________________________   FINAL CLINICAL IMPRESSION(S) / ED DIAGNOSES  Final diagnoses:  None     ED Discharge Orders    None       Note:  This document was prepared using Dragon voice recognition software and may include unintentional dictation errors.    Joni ReiningSmith, Juriel Cid K, PA-C 12/07/18 1134    Joni ReiningSmith, Kynisha Memon K, PA-C 12/07/18 1138    Willy Eddyobinson, Patrick, MD 12/07/18 [phone]

## 2018-12-07 NOTE — ED Triage Notes (Signed)
Presents with head lice exposure

## 2019-01-19 ENCOUNTER — Ambulatory Visit: Payer: Medicaid Other | Admitting: Family Medicine

## 2019-02-22 ENCOUNTER — Ambulatory Visit: Payer: Managed Care, Other (non HMO) | Admitting: Family Medicine

## 2019-02-23 ENCOUNTER — Ambulatory Visit (INDEPENDENT_AMBULATORY_CARE_PROVIDER_SITE_OTHER): Payer: Managed Care, Other (non HMO) | Admitting: Family Medicine

## 2019-02-23 ENCOUNTER — Encounter: Payer: Self-pay | Admitting: Family Medicine

## 2019-02-23 ENCOUNTER — Other Ambulatory Visit: Payer: Self-pay

## 2019-02-23 VITALS — BP 128/82 | HR 74 | Temp 98.1°F | Ht 65.5 in | Wt 310.2 lb

## 2019-02-23 DIAGNOSIS — Z6841 Body Mass Index (BMI) 40.0 and over, adult: Secondary | ICD-10-CM | POA: Diagnosis not present

## 2019-02-23 DIAGNOSIS — Z0000 Encounter for general adult medical examination without abnormal findings: Secondary | ICD-10-CM

## 2019-02-23 DIAGNOSIS — N898 Other specified noninflammatory disorders of vagina: Secondary | ICD-10-CM | POA: Diagnosis not present

## 2019-02-23 DIAGNOSIS — R600 Localized edema: Secondary | ICD-10-CM

## 2019-02-23 LAB — WET PREP FOR TRICH, YEAST, CLUE
Clue Cell Exam: POSITIVE — AB
Trichomonas Exam: NEGATIVE
Yeast Exam: POSITIVE — AB

## 2019-02-23 MED ORDER — TOPIRAMATE 25 MG PO TABS: 25.0000 mg | ORAL_TABLET | Freq: Two times a day (BID) | ORAL | 0 refills | Status: DC

## 2019-02-23 MED ORDER — FLUCONAZOLE 150 MG PO TABS: 150.0000 mg | ORAL_TABLET | Freq: Once | ORAL | 0 refills | Status: AC

## 2019-02-23 MED ORDER — METRONIDAZOLE 500 MG PO TABS: 500.0000 mg | ORAL_TABLET | Freq: Two times a day (BID) | ORAL | 0 refills | Status: DC

## 2019-02-23 NOTE — Patient Instructions (Addendum)
Boric acid supplements online Probiotics, especially ones formulated for female health Unscented soap

## 2019-02-23 NOTE — Assessment & Plan Note (Signed)
Multiple options reviewed including referral to weight management specialist and alternative medications and lifestyle changes. Pt will continue to work on dietary modifications and good exercise habits. HIIT training reviewed. Agreeable to trying topamax in addition to these healthy lifestyle factors.

## 2019-02-23 NOTE — Progress Notes (Signed)
BP 128/82 (BP Location: Left Arm, Patient Position: Sitting, Cuff Size: Large)   Pulse 74   Temp 98.1 F (36.7 C) (Oral)   Ht 5' 5.5" (1.664 m)   Wt (!) 310 lb 3.2 oz (140.7 kg)   SpO2 96%   BMI 50.83 kg/m    Subjective:    Patient ID: Regina Chandler, female    DOB: 1990/12/20, 28 y.o.   MRN: [id number]  HPI: Regina Chandler is a 28 y.o. female presenting on 02/23/2019 for comprehensive medical examination. Current medical complaints include:see below  Still struggling with her weight. Has been doing herbalife smoothies, cutting back portion sizes, and exercising 3-5 times weekly. Was previously on belviq but didn't have much success and then found out about the recall last month and stopped. Wanting to discuss other options.   Also having some vaginal irritation and discharge the last week or so. Notes she changed soaps from [employer] spring to [employer] products but otherwise no recent changes to routine. Has not tried anything OTC for relief. No urinary sxs, fevers, abdominal pain, concern for STIs.   Still having b/l lower leg edema, was previously on low dose HCTZ but has not been on it for several months. Does not wear compression stockings. Mild aching diffusely but no point tenderness, redness.   She currently lives with: Menopausal Symptoms: no  Depression Screen done today and results listed below:  Depression screen [facility] 2/9 09/18/2018 09/04/2018  Decreased Interest 0 1  Down, Depressed, Hopeless 0 1  PHQ - 2 Score 0 2  Altered sleeping 1 3  Tired, decreased energy 1 3  Change in appetite 0 1  Feeling bad or failure about yourself  0 0  Trouble concentrating 0 1  Moving slowly or fidgety/restless 0 0  Suicidal thoughts 0 0  PHQ-9 Score 2 10    The patient does not have a history of falls. I did not complete a risk assessment for falls. A plan of care for falls was not documented.   Past Medical History:  Past Medical History:  Diagnosis Date  . Anemia   .  Anxiety   . Bipolar 1 disorder (HCC)   . Depression   . GERD (gastroesophageal reflux disease)    NO MEDS  . Headache   . Hepatitis B affecting pregnancy 2007   No current treatment required, titers show up negative  . Hypertension     Surgical History:  Past Surgical History:  Procedure Laterality Date  . LAPAROSCOPIC TUBAL LIGATION N/A 07/30/2018   Procedure: LAPAROSCOPIC TUBAL LIGATION;  Surgeon: Vena Austria, MD;  Location: [facility] ORS;  Service: Gynecology;  Laterality: N/A;  . NO PAST SURGERIES    . NO PAST SURGERIES    . TUBAL LIGATION  07/30/2018   [facility])    Medications:  Current Outpatient Medications on File Prior to Visit  Medication Sig  . ibuprofen (ADVIL,MOTRIN) 600 MG tablet Take 1 tablet (600 mg total) by mouth every 6 (six) hours as needed.  . Prenatal Vit-Fe Fumarate-FA (PRENATAL MULTIVITAMIN) TABS tablet Take 1 tablet by mouth daily.    No current facility-administered medications on file prior to visit.     Allergies:  Allergies  Allergen Reactions  . Penicillins Swelling    Has patient had a PCN reaction causing immediate rash, facial/tongue/throat swelling, SOB or lightheadedness with hypotension: No Has patient had a PCN reaction causing severe rash involving mucus membranes or skin necrosis: No Has patient had a PCN  reaction that required hospitalization No Has patient had a PCN reaction occurring within the last 10 years: No If all of the above answers are "NO", then may proceed with Cephalosporin use.    Social History:  Social History   Socioeconomic History  . Marital status: Single    Spouse name: Not on file  . Number of children: Not on file  . Years of education: Not on file  . Highest education level: Not on file  Occupational History  . Not on file  Social Needs  . Financial resource strain: Not on file  . Food insecurity:    Worry: Not on file    Inability: Not on file  . Transportation needs:    Medical: Not  on file    Non-medical: Not on file  Tobacco Use  . Smoking status: Never Smoker  . Smokeless tobacco: Never Used  Substance and Sexual Activity  . Alcohol use: Yes    Comment: RARE  . Drug use: No  . Sexual activity: Yes    Birth control/protection: Surgical    Comment: Tubal ligation  Lifestyle  . Physical activity:    Days per week: Not on file    Minutes per session: Not on file  . Stress: Not on file  Relationships  . Social connections:    Talks on phone: Not on file    Gets together: Not on file    Attends religious service: Not on file    Active member of club or organization: Not on file    Attends meetings of clubs or organizations: Not on file    Relationship status: Not on file  . Intimate partner violence:    Fear of current or ex partner: Not on file    Emotionally abused: Not on file    Physically abused: Not on file    Forced sexual activity: Not on file  Other Topics Concern  . Not on file  Social History Narrative  . Not on file   Social History   Tobacco Use  Smoking Status Never Smoker  Smokeless Tobacco Never Used   Social History   Substance and Sexual Activity  Alcohol Use Yes   Comment: RARE    Family History:  Family History  Problem Relation Age of Onset  . Hypertension Sister   . Hypertension Brother   . Hypertension Maternal Grandmother   . Diabetes Maternal Grandmother   . Hypertension Maternal Grandfather   . Hypertension Paternal Grandmother   . Hypertension Paternal Grandfather   . Cancer Neg Hx   . Heart disease Neg Hx   . Stroke Neg Hx     Past medical history, surgical history, medications, allergies, family history and social history reviewed with patient today and changes made to appropriate areas of the chart.   Review of Systems - General ROS: positive for  - weight gain Psychological ROS: negative Ophthalmic ROS: negative ENT ROS: negative Allergy and Immunology ROS: negative Hematological and Lymphatic ROS:  negative Endocrine ROS: negative Breast ROS: negative for breast lumps Respiratory ROS: no cough, shortness of breath, or wheezing Cardiovascular ROS: no chest pain or dyspnea on exertion Gastrointestinal ROS: no abdominal pain, change in bowel habits, or black or bloody stools Genito-Urinary ROS: positive for - vulvar/vaginal symptoms Musculoskeletal ROS: negative Neurological ROS: no TIA or stroke symptoms Dermatological ROS: negative All other ROS negative except what is listed above and in the HPI.      Objective:    BP 128/82 (BP  Location: Left Arm, Patient Position: Sitting, Cuff Size: Large)   Pulse 74   Temp 98.1 F (36.7 C) (Oral)   Ht 5' 5.5" (1.664 m)   Wt (!) 310 lb 3.2 oz (140.7 kg)   SpO2 96%   BMI 50.83 kg/m   Wt Readings from Last 3 Encounters:  02/23/19 (!) 310 lb 3.2 oz (140.7 kg)  12/07/18 299 lb 13.2 oz (136 kg)  12/03/18 300 lb (136.1 kg)    Physical Exam Vitals signs and nursing note reviewed.  Constitutional:      General: She is not in acute distress.    Appearance: She is well-developed.  HENT:     Head: Atraumatic.     Right Ear: External ear normal.     Left Ear: External ear normal.     Nose: Nose normal.     Mouth/Throat:     Pharynx: No oropharyngeal exudate.  Eyes:     General: No scleral icterus.    Conjunctiva/sclera: Conjunctivae normal.     Pupils: Pupils are equal, round, and reactive to light.  Neck:     Musculoskeletal: Normal range of motion and neck supple.     Thyroid: No thyromegaly.  Cardiovascular:     Rate and Rhythm: Normal rate and regular rhythm.     Heart sounds: Normal heart sounds.  Pulmonary:     Effort: Pulmonary effort is normal. No respiratory distress.     Breath sounds: Normal breath sounds. No wheezing or rales.  Abdominal:     General: Bowel sounds are normal.     Palpations: Abdomen is soft. There is no mass.     Tenderness: There is no abdominal tenderness.  Genitourinary:    Vagina: Vaginal  discharge present.  Musculoskeletal: Normal range of motion.        General: No tenderness.  Lymphadenopathy:     Cervical: No cervical adenopathy.  Skin:    General: Skin is warm and dry.     Findings: No rash.  Neurological:     General: No focal deficit present.     Mental Status: She is alert and oriented to person, place, and time.     Cranial Nerves: No cranial nerve deficit.  Psychiatric:        Mood and Affect: Mood normal.        Behavior: Behavior normal.        Thought Content: Thought content normal.        Judgment: Judgment normal.     Results for orders placed or performed in visit on 02/23/19  WET PREP FOR TRICH, YEAST, CLUE  Result Value Ref Range   Trichomonas Exam Negative Negative   Yeast Exam Positive (A) Negative   Clue Cell Exam Positive (A) Negative      Assessment & Plan:   Problem List Items Addressed This Visit      Other   BMI 50.0-59.9, adult (HCC)   Bilateral leg edema    Doing well off HCTZ, compression hose, weight loss, sodium restriction reviewed. F/u if worsening or if BPs consistently elevated      Obesity    Multiple options reviewed including referral to weight management specialist and alternative medications and lifestyle changes. Pt will continue to work on dietary modifications and good exercise habits. HIIT training reviewed. Agreeable to trying topamax in addition to these healthy lifestyle factors.        Other Visit Diagnoses    Vaginal irritation    -  Primary  Wet prep + for BV and yeast, tx with flagyl and diflucan. Probiotics and boric acid reviewed, d/c feminine washes. Unscented soaps only   Relevant Orders   WET PREP FOR TRICH, YEAST, CLUE (Completed)   Annual physical exam       Relevant Orders   CBC with Differential/Platelet   Comprehensive metabolic panel   Lipid Panel w/o Chol/HDL Ratio   TSH   UA/M w/rflx Culture, Routine       Follow up plan: Return in about 4 weeks (around 03/23/2019) for weight  check.   LABORATORY TESTING:  - Pap smear: up to date  IMMUNIZATIONS:   - Tdap: Tetanus vaccination status reviewed: last tetanus booster within 10 years. - Influenza: Up to date   PATIENT COUNSELING:   Advised to take 1 mg of folate supplement per day if capable of pregnancy.   Sexuality: Discussed sexually transmitted diseases, partner selection, use of condoms, avoidance of unintended pregnancy  and contraceptive alternatives.   Advised to avoid cigarette smoking.  I discussed with the patient that most people either abstain from alcohol or drink within safe limits (<=14/week and <=4 drinks/occasion for males, <=7/weeks and <= 3 drinks/occasion for females) and that the risk for alcohol disorders and other health effects rises proportionally with the number of drinks per week and how often a drinker exceeds daily limits.  Discussed cessation/primary prevention of drug use and availability of treatment for abuse.   Diet: Encouraged to adjust caloric intake to maintain  or achieve ideal body weight, to reduce intake of dietary saturated fat and total fat, to limit sodium intake by avoiding high sodium foods and not adding table salt, and to maintain adequate dietary potassium and calcium preferably from fresh fruits, vegetables, and low-fat dairy products.    stressed the importance of regular exercise  Injury prevention: Discussed safety belts, safety helmets, smoke detector, smoking near bedding or upholstery.   Dental health: Discussed importance of regular tooth brushing, flossing, and dental visits.    NEXT PREVENTATIVE PHYSICAL DUE IN 1 YEAR. Return in about 4 weeks (around 03/23/2019) for weight check.

## 2019-02-23 NOTE — Assessment & Plan Note (Signed)
Doing well off HCTZ, compression hose, weight loss, sodium restriction reviewed. F/u if worsening or if BPs consistently elevated

## 2019-02-24 LAB — LIPID PANEL W/O CHOL/HDL RATIO
Cholesterol, Total: 140 mg/dL (ref 100–199)
HDL: 45 mg/dL (ref >39)
LDL Calculated: 82 mg/dL (ref 0–99)
Triglycerides: 64 mg/dL (ref 0–149)
VLDL Cholesterol Cal: 13 mg/dL (ref 5–40)

## 2019-02-24 LAB — CBC WITH DIFFERENTIAL/PLATELET
Basophils Absolute: 0 10*3/uL (ref 0.0–0.2)
Basos: 1 %
EOS (ABSOLUTE): 0 10*3/uL (ref 0.0–0.4)
Eos: 1 %
Hematocrit: 37.2 % (ref 34.0–46.6)
Hemoglobin: 12.6 g/dL (ref 11.1–15.9)
Immature Grans (Abs): 0 10*3/uL (ref 0.0–0.1)
Immature Granulocytes: 0 %
Lymphocytes Absolute: 2.2 10*3/uL (ref 0.7–3.1)
Lymphs: 34 %
MCH: 29.2 pg (ref 26.6–33.0)
MCHC: 33.9 g/dL (ref 31.5–35.7)
MCV: 86 fL (ref 79–97)
Monocytes Absolute: 0.4 10*3/uL (ref 0.1–0.9)
Monocytes: 6 %
Neutrophils Absolute: 3.8 10*3/uL (ref 1.4–7.0)
Neutrophils: 58 %
Platelets: 292 10*3/uL (ref 150–450)
RBC: 4.32 x10E6/uL (ref 3.77–5.28)
RDW: 12.7 % (ref 11.7–15.4)
WBC: 6.5 10*3/uL (ref 3.4–10.8)

## 2019-02-24 LAB — COMPREHENSIVE METABOLIC PANEL
ALT: 24 IU/L (ref 0–32)
AST: 34 IU/L (ref 0–40)
Albumin/Globulin Ratio: 1.3 (ref 1.2–2.2)
Albumin: 4.5 g/dL (ref 3.9–5.0)
Alkaline Phosphatase: 110 IU/L (ref 39–117)
BUN/Creatinine Ratio: 12 (ref 9–23)
BUN: 9 mg/dL (ref 6–20)
Bilirubin Total: 0.5 mg/dL (ref 0.0–1.2)
CO2: 26 mmol/L (ref 20–29)
Calcium: 9.1 mg/dL (ref 8.7–10.2)
Chloride: 100 mmol/L (ref 96–106)
Creatinine, Ser: 0.76 mg/dL (ref 0.57–1.00)
GFR calc Af Amer: 122 mL/min/{1.73_m2} (ref >59)
GFR calc non Af Amer: 106 mL/min/{1.73_m2} (ref >59)
Globulin, Total: 3.5 g/dL (ref 1.5–4.5)
Glucose: 84 mg/dL (ref 65–99)
Potassium: 4.1 mmol/L (ref 3.5–5.2)
Sodium: 139 mmol/L (ref 134–144)
Total Protein: 8 g/dL (ref 6.0–8.5)

## 2019-02-24 LAB — TSH: TSH: 3.77 u[IU]/mL (ref 0.450–4.500)

## 2019-02-26 LAB — UA/M W/RFLX CULTURE, ROUTINE
Bilirubin, UA: NEGATIVE
Glucose, UA: NEGATIVE
Ketones, UA: NEGATIVE
Nitrite, UA: NEGATIVE
Protein, UA: NEGATIVE
Specific Gravity, UA: 1.015 (ref 1.005–1.030)
Urobilinogen, Ur: 0.2 mg/dL (ref 0.2–1.0)
pH, UA: 7 (ref 5.0–7.5)

## 2019-02-26 LAB — URINE CULTURE, REFLEX

## 2019-02-26 LAB — MICROSCOPIC EXAMINATION: RBC, UA: NONE SEEN /hpf (ref 0–2)

## 2019-03-13 ENCOUNTER — Other Ambulatory Visit: Payer: Self-pay | Admitting: Obstetrics and Gynecology

## 2019-03-17 ENCOUNTER — Other Ambulatory Visit: Payer: Self-pay | Admitting: Family Medicine

## 2019-03-29 ENCOUNTER — Ambulatory Visit (INDEPENDENT_AMBULATORY_CARE_PROVIDER_SITE_OTHER): Payer: Managed Care, Other (non HMO) | Admitting: Family Medicine

## 2019-03-29 ENCOUNTER — Other Ambulatory Visit: Payer: Self-pay

## 2019-03-29 ENCOUNTER — Encounter: Payer: Self-pay | Admitting: Family Medicine

## 2019-03-29 VITALS — Ht 66.0 in | Wt 298.0 lb

## 2019-03-29 DIAGNOSIS — Z6841 Body Mass Index (BMI) 40.0 and over, adult: Secondary | ICD-10-CM | POA: Diagnosis not present

## 2019-03-29 DIAGNOSIS — R61 Generalized hyperhidrosis: Secondary | ICD-10-CM | POA: Diagnosis not present

## 2019-03-29 DIAGNOSIS — R600 Localized edema: Secondary | ICD-10-CM

## 2019-03-29 MED ORDER — TOPIRAMATE 50 MG PO TABS: 50.0000 mg | ORAL_TABLET | Freq: Two times a day (BID) | ORAL | 0 refills | Status: DC

## 2019-03-29 MED ORDER — ALUMINUM CHLORIDE 20 % EX SOLN: Freq: Every day | CUTANEOUS | 2 refills | Status: DC

## 2019-03-29 NOTE — Progress Notes (Signed)
Ht 5\' 6"  (1.676 m)   Wt 298 lb (135.2 kg)   BMI 48.10 kg/m    Subjective:    Patient ID: Regina FowlerAnastasia J Chandler, female    DOB: 1991-11-18, 28 y.o.   MRN: [id number]  HPI: Regina Fowlernastasia J Chandler is a 28 y.o. female  Chief Complaint  Patient presents with  . Weight Check    Weight and diet Follow-up  . Headache    Patient states she's still having headaches    . This visit was completed via WebEx due to the restrictions of the COVID-19 pandemic. All issues as above were discussed and addressed. Physical exam was done as above through visual confirmation on WebEx. If it was felt that the patient should be evaluated in the office, they were directed there. The patient verbally consented to this visit. . Location of the patient: home . Location of the provider: home . Those involved with this call:  . Provider: Roosvelt Maserachel Gratia Disla, PA-C . CMA: Myrtha MantisKeri Bullock, CMA . Front Desk/Registration: Harriet PhoJoliza Johnson  . Time spent on call: 25 minutes with patient face to face via video conference. More than 50% of this time was spent in counseling and coordination of care. 10 minutes total spent in review of patient's record and preparation of their chart.  I verified patient identity using two factors (patient name and date of birth). Patient consents verbally to being seen via telemedicine visit today.   Patient here today for weight follow up. Started on low dose topamax last month, tolerating very well and noting some appetite suppression from it. Has lost about 10 lb total. Notes she's been trying to exercise at least 5x/week in addition to eating smaller portions. Feeling very well as she's losing weight. Notes her leg pain is improving as is the swelling. Of note, is back on the HCTZ now through GYN. Tolerating well.   Notes issues with foot sweating. Wears steel toed boots daily for work and has tried and failed several OTC foot powders and sprays. Denies any foot rashes, itching, redness, peeling.   Past  Medical History:  Diagnosis Date  . Anemia   . Anxiety   . Bipolar 1 disorder (HCC)   . Depression   . GERD (gastroesophageal reflux disease)    NO MEDS  . Headache   . Hepatitis B affecting pregnancy 2007   No current treatment required, titers show up negative  . Hypertension    Social History   Socioeconomic History  . Marital status: Single    Spouse name: Not on file  . Number of children: Not on file  . Years of education: Not on file  . Highest education level: Not on file  Occupational History  . Not on file  Social Needs  . Financial resource strain: Not on file  . Food insecurity:    Worry: Not on file    Inability: Not on file  . Transportation needs:    Medical: Not on file    Non-medical: Not on file  Tobacco Use  . Smoking status: Never Smoker  . Smokeless tobacco: Never Used  Substance and Sexual Activity  . Alcohol use: Yes    Comment: RARE  . Drug use: No  . Sexual activity: Yes    Birth control/protection: Surgical    Comment: Tubal ligation  Lifestyle  . Physical activity:    Days per week: Not on file    Minutes per session: Not on file  . Stress: Not on file  Relationships  . Social connections:    Talks on phone: Not on file    Gets together: Not on file    Attends religious service: Not on file    Active member of club or organization: Not on file    Attends meetings of clubs or organizations: Not on file    Relationship status: Not on file  . Intimate partner violence:    Fear of current or ex partner: Not on file    Emotionally abused: Not on file    Physically abused: Not on file    Forced sexual activity: Not on file  Other Topics Concern  . Not on file  Social History Narrative  . Not on file    Relevant past medical, surgical, family and social history reviewed and updated as indicated. Interim medical history since our last visit reviewed. Allergies and medications reviewed and updated.  Review of Systems  Per HPI  unless specifically indicated above     Objective:    Ht  (1.676 m)   Wt 298 lb (135.2 kg)   BMI 48.10 kg/m   Wt Readings from Last 3 Encounters:  03/29/19 298 lb (135.2 kg)  02/23/19 (!) 310 lb 3.2 oz (140.7 kg)  12/07/18 299 lb 13.2 oz (136 kg)    Physical Exam Vitals signs and nursing note reviewed.  Constitutional:      General: She is not in acute distress.    Appearance: She is obese.  HENT:     Head: Atraumatic.     Right Ear: External ear normal.     Left Ear: External ear normal.     Nose: Nose normal. No congestion.     Mouth/Throat:     Mouth: Mucous membranes are moist.     Pharynx: Oropharynx is clear. No posterior oropharyngeal erythema.  Eyes:     Extraocular Movements: Extraocular movements intact.     Conjunctiva/sclera: Conjunctivae normal.  Neck:     Musculoskeletal: Normal range of motion.  Cardiovascular:     Comments: Unable to assess via virtual visit Pulmonary:     Effort: Pulmonary effort is normal. No respiratory distress.  Musculoskeletal: Normal range of motion.  Skin:    General: Skin is dry.     Findings: No erythema.  Neurological:     Mental Status: She is alert and oriented to person, place, and time.  Psychiatric:        Mood and Affect: Mood normal.        Thought Content: Thought content normal.        Judgment: Judgment normal.     Results for orders placed or performed in visit on 02/23/19  WET PREP FOR TRICH, YEAST, CLUE  Result Value Ref Range   Trichomonas Exam Negative Negative   Yeast Exam Positive (A) Negative   Clue Cell Exam Positive (A) Negative  Microscopic Examination  Result Value Ref Range   WBC, UA 0-5 0 - 5 /hpf   RBC, UA None seen 0 - 2 /hpf   Epithelial Cells (non renal) 0-10 0 - 10 /hpf   Bacteria, UA Many (A) None seen/Few   Yeast, UA Present None seen  Urine Culture, Reflex  Result Value Ref Range   Urine Culture, Routine Final report    Organism ID, Bacteria Comment   CBC with  Differential/Platelet  Result Value Ref Range   WBC 6.5 3.4 - 10.8 x10E3/uL   RBC 4.32 3.77 - 5.28 x10E6/uL   Hemoglobin 12.6 11.1 -  15.9 g/dL   Hematocrit 40.9 81.1 - 46.6 %   MCV 86 79 - 97 fL   MCH 29.2 26.6 - 33.0 pg   MCHC 33.9 31.5 - 35.7 g/dL   RDW 91.4 78.2 - 95.6 %   Platelets 292 150 - 450 x10E3/uL   Neutrophils 58 Not Estab. %   Lymphs 34 Not Estab. %   Monocytes 6 Not Estab. %   Eos 1 Not Estab. %   Basos 1 Not Estab. %   Neutrophils Absolute 3.8 1.4 - 7.0 x10E3/uL   Lymphocytes Absolute 2.2 0.7 - 3.1 x10E3/uL   Monocytes Absolute 0.4 0.1 - 0.9 x10E3/uL   EOS (ABSOLUTE) 0.0 0.0 - 0.4 x10E3/uL   Basophils Absolute 0.0 0.0 - 0.2 x10E3/uL   Immature Granulocytes 0 Not Estab. %   Immature Grans (Abs) 0.0 0.0 - 0.1 x10E3/uL  Comprehensive metabolic panel  Result Value Ref Range   Glucose 84 65 - 99 mg/dL   BUN 9 6 - 20 mg/dL   Creatinine, Ser 2.13 0.57 - 1.00 mg/dL   GFR calc non Af Amer 106 >59 mL/min/1.73   GFR calc Af Amer 122 >59 mL/min/1.73   BUN/Creatinine Ratio 12 9 - 23   Sodium 139 134 - 144 mmol/L   Potassium 4.1 3.5 - 5.2 mmol/L   Chloride 100 96 - 106 mmol/L   CO2 26 20 - 29 mmol/L   Calcium 9.1 8.7 - 10.2 mg/dL   Total Protein 8.0 6.0 - 8.5 g/dL   Albumin 4.5 3.9 - 5.0 g/dL   Globulin, Total 3.5 1.5 - 4.5 g/dL   Albumin/Globulin Ratio 1.3 1.2 - 2.2   Bilirubin Total 0.5 0.0 - 1.2 mg/dL   Alkaline Phosphatase 110 39 - 117 IU/L   AST 34 0 - 40 IU/L   ALT 24 0 - 32 IU/L  Lipid Panel w/o Chol/HDL Ratio  Result Value Ref Range   Cholesterol, Total 140 100 - 199 mg/dL   Triglycerides 64 0 - 149 mg/dL   HDL 45 >08 mg/dL   VLDL Cholesterol Cal 13 5 - 40 mg/dL   LDL Calculated 82 0 - 99 mg/dL  TSH  Result Value Ref Range   TSH 3.770 0.450 - 4.500 uIU/mL  UA/M w/rflx Culture, Routine  Result Value Ref Range   Specific Gravity, UA 1.015 1.005 - 1.030   pH, UA 7.0 5.0 - 7.5   Color, UA Yellow Yellow   Appearance Ur Clear Clear   Leukocytes, UA Trace  (A) Negative   Protein, UA Negative Negative/Trace   Glucose, UA Negative Negative   Ketones, UA Negative Negative   RBC, UA Trace (A) Negative   Bilirubin, UA Negative Negative   Urobilinogen, Ur 0.2 0.2 - 1.0 mg/dL   Nitrite, UA Negative Negative   Microscopic Examination See below:    Urinalysis Reflex Comment       Assessment & Plan:   Problem List Items Addressed This Visit      Other   BMI 45.0-49.9, adult (HCC) - Primary    Congratulated 10 lb weight loss, encouraged continued excellent lifestyle modifications with portion control, exercise, reducing intake of processed sugars/foods and sugary drinks. Increase topamax to 50 mg BID and monitor for benefit. F/u in 1 month      Bilateral leg edema    Improved with weight loss and HCTZ. Discussed compression stockings, continued increased exercise and weight loss. Continue current regimen       Other Visit Diagnoses  Excessive sweating       Drysol sent for topical use on feet. Continue using powders daily, change socks halfway through shift, rotate shoes every other day       Follow up plan: Return in about 4 weeks (around 04/26/2019) for Weight check.

## 2019-03-30 ENCOUNTER — Other Ambulatory Visit: Payer: Self-pay | Admitting: Family Medicine

## 2019-03-30 ENCOUNTER — Encounter: Payer: Self-pay | Admitting: Family Medicine

## 2019-04-01 NOTE — Assessment & Plan Note (Signed)
Congratulated 10 lb weight loss, encouraged continued excellent lifestyle modifications with portion control, exercise, reducing intake of processed sugars/foods and sugary drinks. Increase topamax to 50 mg BID and monitor for benefit. F/u in 1 month

## 2019-04-01 NOTE — Assessment & Plan Note (Signed)
Improved with weight loss and HCTZ. Discussed compression stockings, continued increased exercise and weight loss. Continue current regimen

## 2019-04-09 ENCOUNTER — Other Ambulatory Visit: Payer: Self-pay

## 2019-04-09 MED ORDER — TOPIRAMATE 50 MG PO TABS: 50.0000 mg | ORAL_TABLET | Freq: Two times a day (BID) | ORAL | 0 refills | Status: DC

## 2019-04-09 NOTE — Telephone Encounter (Signed)
Left message on machine for pt to return call to the office.  

## 2019-04-09 NOTE — Telephone Encounter (Signed)
Pt requesting 90 day supply

## 2019-04-28 ENCOUNTER — Ambulatory Visit (INDEPENDENT_AMBULATORY_CARE_PROVIDER_SITE_OTHER): Payer: Managed Care, Other (non HMO) | Admitting: Family Medicine

## 2019-04-28 ENCOUNTER — Other Ambulatory Visit: Payer: Self-pay

## 2019-04-28 ENCOUNTER — Encounter: Payer: Self-pay | Admitting: Family Medicine

## 2019-04-28 DIAGNOSIS — N920 Excessive and frequent menstruation with regular cycle: Secondary | ICD-10-CM | POA: Diagnosis not present

## 2019-04-28 DIAGNOSIS — Z6841 Body Mass Index (BMI) 40.0 and over, adult: Secondary | ICD-10-CM | POA: Diagnosis not present

## 2019-04-28 MED ORDER — NORGESTIMATE-ETH ESTRADIOL 0.25-35 MG-MCG PO TABS: 1.0000 | ORAL_TABLET | Freq: Every day | ORAL | 2 refills | Status: DC

## 2019-04-28 NOTE — Progress Notes (Signed)
There were no vitals taken for this visit.   Subjective:    Patient ID: Regina Chandler, female    DOB: Nov 15, 1991, 28 y.o.   MRN: [id number]  HPI: Regina Chandler is a 28 y.o. female  Chief Complaint  Patient presents with  . Weight Check    . This visit was completed via WebEx due to the restrictions of the COVID-19 pandemic. All issues as above were discussed and addressed. Physical exam was done as above through visual confirmation on WebEx. If it was felt that the patient should be evaluated in the office, they were directed there. The patient verbally consented to this visit. . Location of the patient: in car . Location of the provider: home . Those involved with this call:  . Provider: Roosvelt Maser, PA-C . CMA: Wilhemena Durie, CMA . Front Desk/Registration: Harriet Pho  . Time spent on call: 20 minutes with patient face to face via video conference. More than 50% of this time was spent in counseling and coordination of care. 5 minutes total spent in review of patient's record and preparation of their chart. I verified patient identity using two factors (patient name and date of birth). Patient consents verbally to being seen via telemedicine visit today.   Patient presenting today for 1 month weight f/u. Topamax was increased at last visit to 50 mg BID. Had some nausea initially but that has since resolved. Feeling excellent benefit from the medicine, appetite noticeably reduced. Trying to make healthier choices and stay more active. Last she weighed a week or so ago she was down another 2 pounds.   Having significantly heavier menstrual cycles since tubal ligation last year. Periods are regular and otherwise unremarkable.   Relevant past medical, surgical, family and social history reviewed and updated as indicated. Interim medical history since our last visit reviewed. Allergies and medications reviewed and updated.  Review of Systems  Per HPI unless specifically  indicated above     Objective:    There were no vitals taken for this visit.  Wt Readings from Last 3 Encounters:  03/29/19 298 lb (135.2 kg)  02/23/19 (!) 310 lb 3.2 oz (140.7 kg)  12/07/18 299 lb 13.2 oz (136 kg)    Physical Exam Vitals signs and nursing note reviewed.  Constitutional:      General: She is not in acute distress.    Appearance: Normal appearance.  HENT:     Head: Atraumatic.     Right Ear: External ear normal.     Left Ear: External ear normal.     Nose: Nose normal. No congestion.     Mouth/Throat:     Mouth: Mucous membranes are moist.     Pharynx: Oropharynx is clear. No posterior oropharyngeal erythema.  Eyes:     Extraocular Movements: Extraocular movements intact.     Conjunctiva/sclera: Conjunctivae normal.  Neck:     Musculoskeletal: Normal range of motion.  Cardiovascular:     Comments: Unable to assess via virtual visit Pulmonary:     Effort: Pulmonary effort is normal. No respiratory distress.  Musculoskeletal: Normal range of motion.  Skin:    General: Skin is dry.     Findings: No erythema.  Neurological:     Mental Status: She is alert and oriented to person, place, and time.  Psychiatric:        Mood and Affect: Mood normal.        Thought Content: Thought content normal.  Judgment: Judgment normal.     Results for orders placed or performed in visit on 02/23/19  WET PREP FOR TRICH, YEAST, CLUE  Result Value Ref Range   Trichomonas Exam Negative Negative   Yeast Exam Positive (A) Negative   Clue Cell Exam Positive (A) Negative  Microscopic Examination  Result Value Ref Range   WBC, UA 0-5 0 - 5 /hpf   RBC, UA None seen 0 - 2 /hpf   Epithelial Cells (non renal) 0-10 0 - 10 /hpf   Bacteria, UA Many (A) None seen/Few   Yeast, UA Present None seen  Urine Culture, Reflex  Result Value Ref Range   Urine Culture, Routine Final report    Organism ID, Bacteria Comment   CBC with Differential/Platelet  Result Value Ref Range    WBC 6.5 3.4 - 10.8 x10E3/uL   RBC 4.32 3.77 - 5.28 x10E6/uL   Hemoglobin 12.6 11.1 - 15.9 g/dL   Hematocrit 86.537.2 78.434.0 - 46.6 %   MCV 86 79 - 97 fL   MCH 29.2 26.6 - 33.0 pg   MCHC 33.9 31.5 - 35.7 g/dL   RDW 69.612.7 29.511.7 - 28.415.4 %   Platelets 292 150 - 450 x10E3/uL   Neutrophils 58 Not Estab. %   Lymphs 34 Not Estab. %   Monocytes 6 Not Estab. %   Eos 1 Not Estab. %   Basos 1 Not Estab. %   Neutrophils Absolute 3.8 1.4 - 7.0 x10E3/uL   Lymphocytes Absolute 2.2 0.7 - 3.1 x10E3/uL   Monocytes Absolute 0.4 0.1 - 0.9 x10E3/uL   EOS (ABSOLUTE) 0.0 0.0 - 0.4 x10E3/uL   Basophils Absolute 0.0 0.0 - 0.2 x10E3/uL   Immature Granulocytes 0 Not Estab. %   Immature Grans (Abs) 0.0 0.0 - 0.1 x10E3/uL  Comprehensive metabolic panel  Result Value Ref Range   Glucose 84 65 - 99 mg/dL   BUN 9 6 - 20 mg/dL   Creatinine, Ser 1.320.76 0.57 - 1.00 mg/dL   GFR calc non Af Amer 106 >59 mL/min/1.73   GFR calc Af Amer 122 >59 mL/min/1.73   BUN/Creatinine Ratio 12 9 - 23   Sodium 139 134 - 144 mmol/L   Potassium 4.1 3.5 - 5.2 mmol/L   Chloride 100 96 - 106 mmol/L   CO2 26 20 - 29 mmol/L   Calcium 9.1 8.7 - 10.2 mg/dL   Total Protein 8.0 6.0 - 8.5 g/dL   Albumin 4.5 3.9 - 5.0 g/dL   Globulin, Total 3.5 1.5 - 4.5 g/dL   Albumin/Globulin Ratio 1.3 1.2 - 2.2   Bilirubin Total 0.5 0.0 - 1.2 mg/dL   Alkaline Phosphatase 110 39 - 117 IU/L   AST 34 0 - 40 IU/L   ALT 24 0 - 32 IU/L  Lipid Panel w/o Chol/HDL Ratio  Result Value Ref Range   Cholesterol, Total 140 100 - 199 mg/dL   Triglycerides 64 0 - 149 mg/dL   HDL 45 >44>39 mg/dL   VLDL Cholesterol Cal 13 5 - 40 mg/dL   LDL Calculated 82 0 - 99 mg/dL  TSH  Result Value Ref Range   TSH 3.770 0.450 - 4.500 uIU/mL  UA/M w/rflx Culture, Routine  Result Value Ref Range   Specific Gravity, UA 1.015 1.005 - 1.030   pH, UA 7.0 5.0 - 7.5   Color, UA Yellow Yellow   Appearance Ur Clear Clear   Leukocytes, UA Trace (A) Negative   Protein, UA Negative  Negative/Trace   Glucose,  UA Negative Negative   Ketones, UA Negative Negative   RBC, UA Trace (A) Negative   Bilirubin, UA Negative Negative   Urobilinogen, Ur 0.2 0.2 - 1.0 mg/dL   Nitrite, UA Negative Negative   Microscopic Examination See below:    Urinalysis Reflex Comment       Assessment & Plan:   Problem List Items Addressed This Visit      Other   Obesity - Primary    Doing very well on topamax at current dose. Continue current regimen and working on lifestyle modifications       Other Visit Diagnoses    Menorrhagia with regular cycle       Will trial birth control pills to help reduce heavy bleeding during cycles       Follow up plan: Return in about 3 months (around 07/29/2019) for weight check.

## 2019-04-28 NOTE — Assessment & Plan Note (Signed)
Doing very well on topamax at current dose. Continue current regimen and working on lifestyle modifications

## 2019-05-04 ENCOUNTER — Encounter: Payer: Self-pay | Admitting: Family Medicine

## 2019-05-05 ENCOUNTER — Other Ambulatory Visit: Payer: Self-pay

## 2019-05-05 ENCOUNTER — Ambulatory Visit (INDEPENDENT_AMBULATORY_CARE_PROVIDER_SITE_OTHER): Payer: Managed Care, Other (non HMO) | Admitting: Family Medicine

## 2019-05-05 ENCOUNTER — Encounter: Payer: Self-pay | Admitting: Family Medicine

## 2019-05-05 DIAGNOSIS — N946 Dysmenorrhea, unspecified: Secondary | ICD-10-CM | POA: Diagnosis not present

## 2019-05-05 MED ORDER — IBUPROFEN 800 MG PO TABS: 800.0000 mg | ORAL_TABLET | Freq: Three times a day (TID) | ORAL | 1 refills | Status: DC | PRN

## 2019-05-05 NOTE — Progress Notes (Signed)
There were no vitals taken for this visit.   Subjective:    Patient ID: Regina FowlerAnastasia J Chandler, female    DOB: Jan 31, 1991, 28 y.o.   MRN: [id number]  HPI: Regina Fowlernastasia J Mcwherter is a 28 y.o. female  Chief Complaint  Patient presents with  . Pain    . This visit was completed via WebEx due to the restrictions of the COVID-19 pandemic. All issues as above were discussed and addressed. Physical exam was done as above through visual confirmation on WebEx. If it was felt that the patient should be evaluated in the office, they were directed there. The patient verbally consented to this visit. . Location of the patient: home . Location of the provider: home . Those involved with this call:  . Provider: Roosvelt Maserachel Lane, PA-C . CMA: Wilhemena DurieBrittany Russell, CMA . Front Desk/Registration: Harriet PhoJoliza Johnson  . Time spent on call: 15 minutes with patient face to face via video conference. More than 50% of this time was spent in counseling and coordination of care. 5 minutes total spent in review of patient's record and preparation of their chart. I verified patient identity using two factors (patient name and date of birth). Patient consents verbally to being seen via telemedicine visit today.   Here today to request a refill on ibuprofen for prn use for painful menstrual cramps. Works well for her when she needs it. Periods have been heavy and painful, was given oral contraceptives at prior visit to help control these issues but has not yet started it. Hx of tubal ligation last year.   Relevant past medical, surgical, family and social history reviewed and updated as indicated. Interim medical history since our last visit reviewed. Allergies and medications reviewed and updated.  Review of Systems  Per HPI unless specifically indicated above     Objective:    There were no vitals taken for this visit.  Wt Readings from Last 3 Encounters:  03/29/19 298 lb (135.2 kg)  02/23/19 (!) 310 lb 3.2 oz (140.7 kg)   12/07/18 299 lb 13.2 oz (136 kg)    Physical Exam Vitals signs and nursing note reviewed.  Constitutional:      General: She is not in acute distress.    Appearance: Normal appearance.  HENT:     Head: Atraumatic.     Right Ear: External ear normal.     Left Ear: External ear normal.     Nose: Nose normal. No congestion.     Mouth/Throat:     Mouth: Mucous membranes are moist.     Pharynx: Oropharynx is clear. No posterior oropharyngeal erythema.  Eyes:     Extraocular Movements: Extraocular movements intact.     Conjunctiva/sclera: Conjunctivae normal.  Neck:     Musculoskeletal: Normal range of motion.  Cardiovascular:     Comments: Unable to assess via virtual visit Pulmonary:     Effort: Pulmonary effort is normal. No respiratory distress.  Musculoskeletal: Normal range of motion.  Skin:    General: Skin is dry.     Findings: No erythema.  Neurological:     Mental Status: She is alert and oriented to person, place, and time.  Psychiatric:        Mood and Affect: Mood normal.        Thought Content: Thought content normal.        Judgment: Judgment normal.     Results for orders placed or performed in visit on 02/23/19  WET PREP FOR TRICH, YEAST, CLUE  Result Value Ref Range   Trichomonas Exam Negative Negative   Yeast Exam Positive (A) Negative   Clue Cell Exam Positive (A) Negative  Microscopic Examination  Result Value Ref Range   WBC, UA 0-5 0 - 5 /hpf   RBC, UA None seen 0 - 2 /hpf   Epithelial Cells (non renal) 0-10 0 - 10 /hpf   Bacteria, UA Many (A) None seen/Few   Yeast, UA Present None seen  Urine Culture, Reflex  Result Value Ref Range   Urine Culture, Routine Final report    Organism ID, Bacteria Comment   CBC with Differential/Platelet  Result Value Ref Range   WBC 6.5 3.4 - 10.8 x10E3/uL   RBC 4.32 3.77 - 5.28 x10E6/uL   Hemoglobin 12.6 11.1 - 15.9 g/dL   Hematocrit 96.7 59.1 - 46.6 %   MCV 86 79 - 97 fL   MCH 29.2 26.6 - 33.0 pg   MCHC  33.9 31.5 - 35.7 g/dL   RDW 63.8 46.6 - 59.9 %   Platelets 292 150 - 450 x10E3/uL   Neutrophils 58 Not Estab. %   Lymphs 34 Not Estab. %   Monocytes 6 Not Estab. %   Eos 1 Not Estab. %   Basos 1 Not Estab. %   Neutrophils Absolute 3.8 1.4 - 7.0 x10E3/uL   Lymphocytes Absolute 2.2 0.7 - 3.1 x10E3/uL   Monocytes Absolute 0.4 0.1 - 0.9 x10E3/uL   EOS (ABSOLUTE) 0.0 0.0 - 0.4 x10E3/uL   Basophils Absolute 0.0 0.0 - 0.2 x10E3/uL   Immature Granulocytes 0 Not Estab. %   Immature Grans (Abs) 0.0 0.0 - 0.1 x10E3/uL  Comprehensive metabolic panel  Result Value Ref Range   Glucose 84 65 - 99 mg/dL   BUN 9 6 - 20 mg/dL   Creatinine, Ser 3.57 0.57 - 1.00 mg/dL   GFR calc non Af Amer 106 >59 mL/min/1.73   GFR calc Af Amer 122 >59 mL/min/1.73   BUN/Creatinine Ratio 12 9 - 23   Sodium 139 134 - 144 mmol/L   Potassium 4.1 3.5 - 5.2 mmol/L   Chloride 100 96 - 106 mmol/L   CO2 26 20 - 29 mmol/L   Calcium 9.1 8.7 - 10.2 mg/dL   Total Protein 8.0 6.0 - 8.5 g/dL   Albumin 4.5 3.9 - 5.0 g/dL   Globulin, Total 3.5 1.5 - 4.5 g/dL   Albumin/Globulin Ratio 1.3 1.2 - 2.2   Bilirubin Total 0.5 0.0 - 1.2 mg/dL   Alkaline Phosphatase 110 39 - 117 IU/L   AST 34 0 - 40 IU/L   ALT 24 0 - 32 IU/L  Lipid Panel w/o Chol/HDL Ratio  Result Value Ref Range   Cholesterol, Total 140 100 - 199 mg/dL   Triglycerides 64 0 - 149 mg/dL   HDL 45 >01 mg/dL   VLDL Cholesterol Cal 13 5 - 40 mg/dL   LDL Calculated 82 0 - 99 mg/dL  TSH  Result Value Ref Range   TSH 3.770 0.450 - 4.500 uIU/mL  UA/M w/rflx Culture, Routine  Result Value Ref Range   Specific Gravity, UA 1.015 1.005 - 1.030   pH, UA 7.0 5.0 - 7.5   Color, UA Yellow Yellow   Appearance Ur Clear Clear   Leukocytes, UA Trace (A) Negative   Protein, UA Negative Negative/Trace   Glucose, UA Negative Negative   Ketones, UA Negative Negative   RBC, UA Trace (A) Negative   Bilirubin, UA Negative Negative   Urobilinogen, Ur  0.2 0.2 - 1.0 mg/dL   Nitrite,  UA Negative Negative   Microscopic Examination See below:    Urinalysis Reflex Comment       Assessment & Plan:   Problem List Items Addressed This Visit    None    Visit Diagnoses    Menstrual cramps    -  Primary   Refill ibuprofen for prn use. Start contraceptive and monitor for benefit       Follow up plan: Return for as scheduled.

## 2019-07-25 ENCOUNTER — Other Ambulatory Visit: Payer: Self-pay | Admitting: Family Medicine

## 2019-07-26 NOTE — Telephone Encounter (Signed)
Routing to provider  

## 2019-07-26 NOTE — Telephone Encounter (Signed)
Please advise it states this refill can not be delegated

## 2019-07-30 ENCOUNTER — Ambulatory Visit: Payer: Managed Care, Other (non HMO) | Admitting: Family Medicine

## 2019-08-06 ENCOUNTER — Other Ambulatory Visit: Payer: Self-pay

## 2019-08-06 ENCOUNTER — Ambulatory Visit (INDEPENDENT_AMBULATORY_CARE_PROVIDER_SITE_OTHER): Payer: Managed Care, Other (non HMO) | Admitting: Family Medicine

## 2019-08-06 ENCOUNTER — Encounter: Payer: Self-pay | Admitting: Family Medicine

## 2019-08-06 VITALS — BP 127/78 | HR 80 | Temp 98.2°F | Ht 65.0 in | Wt 307.0 lb

## 2019-08-06 DIAGNOSIS — Z6841 Body Mass Index (BMI) 40.0 and over, adult: Secondary | ICD-10-CM

## 2019-08-06 DIAGNOSIS — N898 Other specified noninflammatory disorders of vagina: Secondary | ICD-10-CM | POA: Diagnosis not present

## 2019-08-06 LAB — WET PREP FOR TRICH, YEAST, CLUE
Clue Cell Exam: POSITIVE — AB
Trichomonas Exam: NEGATIVE
Yeast Exam: POSITIVE — AB

## 2019-08-06 MED ORDER — FLUCONAZOLE 150 MG PO TABS: 150.0000 mg | ORAL_TABLET | ORAL | 0 refills | Status: DC

## 2019-08-06 MED ORDER — KETOCONAZOLE 2 % EX CREA: 1.0000 "application " | TOPICAL_CREAM | Freq: Two times a day (BID) | CUTANEOUS | 0 refills | Status: DC

## 2019-08-06 MED ORDER — DRYSOL 20 % EX SOLN: Freq: Every day | CUTANEOUS | 2 refills | Status: DC

## 2019-08-06 MED ORDER — METRONIDAZOLE 500 MG PO TABS: 500.0000 mg | ORAL_TABLET | Freq: Two times a day (BID) | ORAL | 0 refills | Status: DC

## 2019-08-06 NOTE — Progress Notes (Signed)
BP 127/78   Pulse 80   Temp 98.2 F (36.8 C) (Oral)   Ht 5\' 5"  (1.651 m)   Wt (!) 307 lb (139.3 kg)   SpO2 98%   BMI 51.09 kg/m    Subjective:    Patient ID: Regina Chandler, female    DOB: [birth year], 28 y.o.   MRN: [id number]  HPI: Regina Chandler is a 28 y.o. female  Chief Complaint  Patient presents with  . Weight Check    99m f/u  . Vaginal Itching    x about a week   Patient presents today for 3 month weight f/u. Currently on topamax and feels things are going well. Discouraged by her weight not going down but notes her pants are fitting differently and she sees a big difference physically. Trying to exercise more often. No side effects or issues with the medication.  Also having 1 week of vaginal itching and irritation. No rashes, discharge, abdominal pain, N/V/D, exposures to STIs. Not trying anything OTC for sxs.   Relevant past medical, surgical, family and social history reviewed and updated as indicated. Interim medical history since our last visit reviewed. Allergies and medications reviewed and updated.  Review of Systems  Per HPI unless specifically indicated above     Objective:    BP 127/78   Pulse 80   Temp 98.2 F (36.8 C) (Oral)   Ht 5\' 5"  (1.651 m)   Wt (!) 307 lb (139.3 kg)   SpO2 98%   BMI 51.09 kg/m   Wt Readings from Last 3 Encounters:  08/06/19 (!) 307 lb (139.3 kg)  03/29/19 298 lb (135.2 kg)  02/23/19 (!) 310 lb 3.2 oz (140.7 kg)    Physical Exam Vitals signs and nursing note reviewed.  Constitutional:      Appearance: Normal appearance. She is not ill-appearing.  HENT:     Head: Atraumatic.  Eyes:     Extraocular Movements: Extraocular movements intact.     Conjunctiva/sclera: Conjunctivae normal.  Neck:     Musculoskeletal: Normal range of motion and neck supple.  Cardiovascular:     Rate and Rhythm: Normal rate and regular rhythm.     Heart sounds: Normal heart sounds.  Pulmonary:     Effort: Pulmonary effort is  normal.     Breath sounds: Normal breath sounds.  Musculoskeletal: Normal range of motion.  Skin:    General: Skin is warm and dry.  Neurological:     Mental Status: She is alert and oriented to person, place, and time.  Psychiatric:        Mood and Affect: Mood normal.        Thought Content: Thought content normal.        Judgment: Judgment normal.     Results for orders placed or performed in visit on 08/06/19  WET PREP FOR [place], YEAST, CLUE   Specimen: Vaginal Fluid   VAGINAL FLUI  Result Value Ref Range   Trichomonas Exam Negative Negative   Yeast Exam Positive (A) Negative   Clue Cell Exam Positive (A) Negative      Assessment & Plan:   Problem List Items Addressed This Visit      Other   BMI 45.0-49.9, adult ([place])    Long discussion today about some diet modifications that may be beneficial, mainly cutting back on fruit intake and increasing veggies and lean proteins. Continue working on increasing exercise regimen. Continue topamax       Other Visit  Diagnoses    Vaginal itching    -  Primary   wet prep positive for BV and yeast infection. Tx with flagyl and diflucan and f/u if not improving. Supportive care reviewed   Relevant Orders   WET PREP FOR TRICH, YEAST, CLUE (Completed)    25 min spent today in direct care and counseling.    Follow up plan: Return in about 3 months (around 11/06/2019) for Weight check.

## 2019-08-09 NOTE — Assessment & Plan Note (Signed)
Long discussion today about some diet modifications that may be beneficial, mainly cutting back on fruit intake and increasing veggies and lean proteins. Continue working on increasing exercise regimen. Continue topamax

## 2019-08-20 ENCOUNTER — Other Ambulatory Visit: Payer: Self-pay | Admitting: Family Medicine

## 2019-08-20 NOTE — Telephone Encounter (Signed)
yours

## 2019-08-20 NOTE — Telephone Encounter (Signed)
Forwarding medication refill request to PCP for review. 

## 2019-08-26 ENCOUNTER — Telehealth: Payer: Self-pay | Admitting: Family Medicine

## 2019-08-26 ENCOUNTER — Encounter: Payer: Self-pay | Admitting: Family Medicine

## 2019-08-26 DIAGNOSIS — R0981 Nasal congestion: Secondary | ICD-10-CM

## 2019-08-26 NOTE — Telephone Encounter (Signed)
Needs appt, ok to go for testing first if desired. Order placed

## 2019-08-27 ENCOUNTER — Encounter: Payer: Self-pay | Admitting: Family Medicine

## 2019-08-27 ENCOUNTER — Other Ambulatory Visit: Payer: Self-pay

## 2019-08-27 ENCOUNTER — Ambulatory Visit (INDEPENDENT_AMBULATORY_CARE_PROVIDER_SITE_OTHER): Payer: Managed Care, Other (non HMO) | Admitting: Family Medicine

## 2019-08-27 VITALS — Ht 65.0 in | Wt 296.0 lb

## 2019-08-27 DIAGNOSIS — R52 Pain, unspecified: Secondary | ICD-10-CM | POA: Diagnosis not present

## 2019-08-27 DIAGNOSIS — J029 Acute pharyngitis, unspecified: Secondary | ICD-10-CM | POA: Diagnosis not present

## 2019-08-27 DIAGNOSIS — R519 Headache, unspecified: Secondary | ICD-10-CM

## 2019-08-27 DIAGNOSIS — R51 Headache: Secondary | ICD-10-CM

## 2019-08-27 NOTE — Progress Notes (Signed)
Ht 5\' 5"  (1.651 m)   Wt 296 lb (134.3 kg)   BMI 49.26 kg/m    Subjective:    Patient ID: Regina FowlerAnastasia J Wiland, female    DOB: [birth year], 28 y.o.   MRN: [id number]  HPI: Regina Fowlernastasia J Minnich is a 28 y.o. female  Chief Complaint  Patient presents with  . Headache    Ongoing appx 4 days  . Sore Throat  . Generalized Body Aches  . Diarrhea    . This visit was completed via WebEx due to the restrictions of the COVID-19 pandemic. All issues as above were discussed and addressed. Physical exam was done as above through visual confirmation on WebEx. If it was felt that the patient should be evaluated in the office, they were directed there. The patient verbally consented to this visit. . Location of the patient: home . Location of the provider: work . Those involved with this call:  . Provider: Roosvelt Maserachel Lane, PA-C . CMA: Elton SinAnita Quito, CMA . Front Desk/Registration: Harriet PhoJoliza Johnson  . Time spent on call: 15 minutes with patient face to face via video conference. More than 50% of this time was spent in counseling and coordination of care. 5 minutes total spent in review of patient's record and preparation of their chart. I verified patient identity using two factors (patient name and date of birth). Patient consents verbally to being seen via telemedicine visit today.   About 4 days of sore throat, body aches, headaches, and diarrhea. Has not been taking temperature but feels she may have had a fever. Taking nyquil daily. Has had some sick contacts. Denies CP, SOB, wheezing, N/V.   Relevant past medical, surgical, family and social history reviewed and updated as indicated. Interim medical history since our last visit reviewed. Allergies and medications reviewed and updated.  Review of Systems  Per HPI unless specifically indicated above     Objective:    Ht 5\' 5"  (1.651 m)   Wt 296 lb (134.3 kg)   BMI 49.26 kg/m   Wt Readings from Last 3 Encounters:  08/27/19 296 lb (134.3 kg)   08/06/19 (!) 307 lb (139.3 kg)  03/29/19 298 lb (135.2 kg)    Physical Exam Vitals signs and nursing note reviewed.  Constitutional:      General: She is not in acute distress.    Appearance: Normal appearance.  HENT:     Head: Atraumatic.     Right Ear: External ear normal.     Left Ear: External ear normal.     Nose: Rhinorrhea present.     Mouth/Throat:     Mouth: Mucous membranes are moist.     Pharynx: Oropharynx is clear. Posterior oropharyngeal erythema present.  Eyes:     Extraocular Movements: Extraocular movements intact.     Conjunctiva/sclera: Conjunctivae normal.  Neck:     Musculoskeletal: Normal range of motion.  Cardiovascular:     Comments: Unable to assess via virtual visit Pulmonary:     Effort: Pulmonary effort is normal. No respiratory distress.  Musculoskeletal: Normal range of motion.  Skin:    General: Skin is dry.     Findings: No erythema.  Neurological:     Mental Status: She is alert and oriented to person, place, and time.  Psychiatric:        Mood and Affect: Mood normal.        Thought Content: Thought content normal.        Judgment: Judgment normal.  Results for orders placed or performed in visit on 08/06/19  WET PREP FOR [place], YEAST, CLUE   Specimen: Vaginal Fluid   VAGINAL FLUI  Result Value Ref Range   Trichomonas Exam Negative Negative   Yeast Exam Positive (A) Negative   Clue Cell Exam Positive (A) Negative      Assessment & Plan:   Problem List Items Addressed This Visit    None    Visit Diagnoses    Acute nonintractable headache, unspecified headache type    -  Primary   Sore throat       Generalized body aches        Will refer for COVID 19 testing and instructed to self quarantine until results are back. Supportive care reviewed with patient. Strict return precautions given.    Follow up plan: Return if symptoms worsen or fail to improve.

## 2019-09-23 ENCOUNTER — Other Ambulatory Visit: Payer: Self-pay | Admitting: Obstetrics and Gynecology

## 2019-10-05 ENCOUNTER — Other Ambulatory Visit: Payer: Self-pay | Admitting: *Deleted

## 2019-10-05 DIAGNOSIS — Z20822 Contact with and (suspected) exposure to covid-19: Secondary | ICD-10-CM

## 2019-10-07 LAB — NOVEL CORONAVIRUS, NAA: SARS-CoV-2, NAA: NOT DETECTED

## 2019-10-23 ENCOUNTER — Other Ambulatory Visit: Payer: Self-pay | Admitting: Family Medicine

## 2019-10-31 ENCOUNTER — Encounter: Payer: Self-pay | Admitting: Family Medicine

## 2019-11-12 ENCOUNTER — Other Ambulatory Visit: Payer: Self-pay

## 2019-11-12 ENCOUNTER — Ambulatory Visit (INDEPENDENT_AMBULATORY_CARE_PROVIDER_SITE_OTHER): Payer: Managed Care, Other (non HMO) | Admitting: Family Medicine

## 2019-11-12 ENCOUNTER — Encounter: Payer: Self-pay | Admitting: Family Medicine

## 2019-11-12 VITALS — BP 138/94 | HR 75 | Temp 98.4°F | Ht 66.0 in | Wt 308.0 lb

## 2019-11-12 DIAGNOSIS — R3915 Urgency of urination: Secondary | ICD-10-CM

## 2019-11-12 DIAGNOSIS — I10 Essential (primary) hypertension: Secondary | ICD-10-CM

## 2019-11-12 DIAGNOSIS — N898 Other specified noninflammatory disorders of vagina: Secondary | ICD-10-CM

## 2019-11-12 DIAGNOSIS — Z6841 Body Mass Index (BMI) 40.0 and over, adult: Secondary | ICD-10-CM | POA: Diagnosis not present

## 2019-11-12 LAB — UA/M W/RFLX CULTURE, ROUTINE
Bilirubin, UA: NEGATIVE
Glucose, UA: NEGATIVE
Ketones, UA: NEGATIVE
Leukocytes,UA: NEGATIVE
Nitrite, UA: NEGATIVE
Protein,UA: NEGATIVE
RBC, UA: NEGATIVE
Specific Gravity, UA: >1.03 — ABNORMAL HIGH (ref 1.005–1.030)
Urobilinogen, Ur: 0.2 mg/dL (ref 0.2–1.0)
pH, UA: 5.5 (ref 5.0–7.5)

## 2019-11-12 LAB — WET PREP FOR TRICH, YEAST, CLUE
Clue Cell Exam: POSITIVE — AB
Trichomonas Exam: NEGATIVE
Yeast Exam: NEGATIVE

## 2019-11-12 MED ORDER — IBUPROFEN 800 MG PO TABS: 800.0000 mg | ORAL_TABLET | Freq: Three times a day (TID) | ORAL | 1 refills | Status: DC | PRN

## 2019-11-12 MED ORDER — CONTRAVE 8-90 MG PO TB12: ORAL_TABLET | ORAL | 0 refills | Status: DC

## 2019-11-12 MED ORDER — METRONIDAZOLE 500 MG PO TABS: 500.0000 mg | ORAL_TABLET | Freq: Two times a day (BID) | ORAL | 0 refills | Status: DC

## 2019-11-12 NOTE — Progress Notes (Signed)
BP (!) 138/94   Pulse 75   Temp 98.4 F (36.9 C) (Oral)   Ht 5\' 6"  (1.676 m)   Wt (!) 308 lb (139.7 kg)   SpO2 97%   BMI 49.71 kg/m    Subjective:    Patient ID: Regina Chandler, female    DOB: 1991-07-20, 28 y.o.   MRN: [id number]  HPI: Regina Chandler is a 28 y.o. female  Chief Complaint  Patient presents with  . Weight Check  . Hypertension    pt states has been out of the hydrochlorothiazide for about a month   Patient presenting today for weight check and HTN f/u. Eating less, exercising some but weight has returned back to baseline despite taking the topamax. Feels like it does help with appetite but does not seem to be getting any results with it. No side effects reported. Has friends who were successful on phentermine and is interested in that option.   Vaginal itching/irritation for about a week now. Switched soaps and laundry detergents. No discharge, pelvic pain, fevers, flank pain, dysuria, hematuria. Not trying anything OTC for sxs.   HTN - has been on HCTZ for mild HTN and LE edema, out for about a month now. Tolerated it well while on it, and did feel it helped with her leg swelling. Wanting to go back on. Has not been following her home BPs while off. Denies CP, SOB, HAs, dizziness.   Relevant past medical, surgical, family and social history reviewed and updated as indicated. Interim medical history since our last visit reviewed. Allergies and medications reviewed and updated.  Review of Systems  Per HPI unless specifically indicated above     Objective:    BP (!) 138/94   Pulse 75   Temp 98.4 F (36.9 C) (Oral)   Ht 5\' 6"  (1.676 m)   Wt (!) 308 lb (139.7 kg)   SpO2 97%   BMI 49.71 kg/m   Wt Readings from Last 3 Encounters:  11/12/19 (!) 308 lb (139.7 kg)  08/27/19 296 lb (134.3 kg)  08/06/19 (!) 307 lb (139.3 kg)    Physical Exam Vitals signs and nursing note reviewed. Exam conducted with a chaperone present.  Constitutional:    Appearance: Normal appearance. She is not ill-appearing.  HENT:     Head: Atraumatic.  Eyes:     Extraocular Movements: Extraocular movements intact.     Conjunctiva/sclera: Conjunctivae normal.  Neck:     Musculoskeletal: Normal range of motion and neck supple.  Cardiovascular:     Rate and Rhythm: Normal rate and regular rhythm.     Heart sounds: Normal heart sounds.  Pulmonary:     Effort: Pulmonary effort is normal.     Breath sounds: Normal breath sounds.  Genitourinary:    Vagina: Vaginal discharge present.  Musculoskeletal: Normal range of motion.  Skin:    General: Skin is warm and dry.  Neurological:     Mental Status: She is alert and oriented to person, place, and time.  Psychiatric:        Mood and Affect: Mood normal.        Thought Content: Thought content normal.        Judgment: Judgment normal.     Results for orders placed or performed in visit on 11/12/19  WET PREP FOR [place], YEAST, CLUE   Specimen: Vaginal Fluid   VAGINAL FLUI  Result Value Ref Range   Trichomonas Exam Negative Negative   Yeast Exam Negative  Negative   Clue Cell Exam Positive (A) Negative  UA/M w/rflx Culture, Routine   Specimen: Vaginal Fluid   VAGINAL FLUI  Result Value Ref Range   Specific Gravity, UA >1.030 (H) 1.005 - 1.030   pH, UA 5.5 5.0 - 7.5   Color, UA Yellow Yellow   Appearance Ur Hazy (A) Clear   Leukocytes,UA Negative Negative   Protein,UA Negative Negative/Trace   Glucose, UA Negative Negative   Ketones, UA Negative Negative   RBC, UA Negative Negative   Bilirubin, UA Negative Negative   Urobilinogen, Ur 0.2 0.2 - 1.0 mg/dL   Nitrite, UA Negative Negative      Assessment & Plan:   Problem List Items Addressed This Visit      Cardiovascular and Mediastinum   Hypertension    BP mildly elevated, and legs mildly swollen b/l. Discussed compression stockings, restarting HCTZ, weight loss, increased exercise. Check home BP readings        Other   Obesity -  Primary    Will d/c topamax as it does not seem to be benefiting her. Trial contrave, continue working on diet changes and increasing exercise. F/u  In 1 month for recheck      Relevant Medications   Naltrexone-buPROPion HCl ER (CONTRAVE) 8-90 MG TB12    Other Visit Diagnoses    Vaginal itching       Relevant Orders   WET PREP FOR TRICH, YEAST, CLUE (Completed)   Urinary urgency       Relevant Orders   UA/M w/rflx Culture, Routine (Completed)       Follow up plan: Return in about 4 weeks (around 12/10/2019) for HTN, weight f/u.

## 2019-11-17 DIAGNOSIS — M1990 Unspecified osteoarthritis, unspecified site: Secondary | ICD-10-CM | POA: Diagnosis not present

## 2019-11-17 NOTE — Assessment & Plan Note (Signed)
Will d/c topamax as it does not seem to be benefiting her. Trial contrave, continue working on diet changes and increasing exercise. F/u  In 1 month for recheck

## 2019-11-17 NOTE — Assessment & Plan Note (Signed)
BP mildly elevated, and legs mildly swollen b/l. Discussed compression stockings, restarting HCTZ, weight loss, increased exercise. Check home BP readings

## 2019-11-22 ENCOUNTER — Telehealth: Payer: Self-pay

## 2019-11-22 NOTE — Telephone Encounter (Signed)
Prior Authorization initiated via CoverMyMeds for Contrave 8-90MG  er tablets Key: [id number]

## 2019-11-22 NOTE — Telephone Encounter (Signed)
Please see if she is willing to try saxenda

## 2019-11-22 NOTE — Telephone Encounter (Signed)
Patient is willing to try Saxenda.

## 2019-11-22 NOTE — Telephone Encounter (Signed)
PA denied. Reasoning: Patient must try and fail Saxenda. Routing to provider to advise.

## 2019-11-23 ENCOUNTER — Other Ambulatory Visit: Payer: Self-pay

## 2019-11-23 ENCOUNTER — Encounter: Payer: Self-pay | Admitting: Obstetrics & Gynecology

## 2019-11-23 ENCOUNTER — Encounter: Payer: Self-pay | Admitting: Family Medicine

## 2019-11-23 ENCOUNTER — Ambulatory Visit (INDEPENDENT_AMBULATORY_CARE_PROVIDER_SITE_OTHER): Payer: Managed Care, Other (non HMO) | Admitting: Obstetrics & Gynecology

## 2019-11-23 VITALS — BP 140/90 | HR 97 | Ht 65.0 in | Wt 312.0 lb

## 2019-11-23 DIAGNOSIS — Z6841 Body Mass Index (BMI) 40.0 and over, adult: Secondary | ICD-10-CM

## 2019-11-23 MED ORDER — NOVOFINE 32G X 6 MM MISC: 1.0000 "application " | Freq: Every day | 3 refills | Status: DC

## 2019-11-23 MED ORDER — SAXENDA 18 MG/3ML ~~LOC~~ SOPN: 3.0000 mg | PEN_INJECTOR | Freq: Every day | SUBCUTANEOUS | 2 refills | Status: DC

## 2019-11-23 NOTE — Patient Instructions (Signed)
Liraglutide injection (Weight Management) What is this medicine? LIRAGLUTIDE (LIR a GLOO tide) is used to help people lose weight and maintain weight loss. It is used with a reduced-calorie diet and exercise. This medicine may be used for other purposes; ask your health care provider or pharmacist if you have questions. COMMON BRAND NAME(S): Saxenda What should I tell my health care provider before I take this medicine? They need to know if you have any of these conditions:  endocrine tumors (MEN 2) or if someone in your family had these tumors  gallbladder disease  high cholesterol  history of alcohol abuse problem  history of pancreatitis  kidney disease or if you are on dialysis  liver disease  previous swelling of the tongue, face, or lips with difficulty breathing, difficulty swallowing, hoarseness, or tightening of the throat  stomach problems  suicidal thoughts, plans, or attempt; a previous suicide attempt by you or a family member  thyroid cancer or if someone in your family had thyroid cancer  an unusual or allergic reaction to liraglutide, other medicines, foods, dyes, or preservatives  pregnant or trying to get pregnant  breast-feeding How should I use this medicine? This medicine is for injection under the skin of your upper leg, stomach area, or upper arm. You will be taught how to prepare and give this medicine. Use exactly as directed. Take your medicine at regular intervals. Do not take it more often than directed. It is important that you put your used needles and syringes in a special sharps container. Do not put them in a trash can. If you do not have a sharps container, call your pharmacist or healthcare provider to get one. A special MedGuide will be given to you by the pharmacist with each prescription and refill. Be sure to read this information carefully each time. Talk to your pediatrician regarding the use of this medicine in children. Special care  may be needed. Overdosage: If you think you have taken too much of this medicine contact a poison control center or emergency room at once. NOTE: This medicine is only for you. Do not share this medicine with others. What if I miss a dose? If you miss a dose, take it as soon as you can. If it is almost time for your next dose, take only that dose. Do not take double or extra doses. If you miss your dose for 3 days or more, call your doctor or health care professional to talk about how to restart this medicine. What may interact with this medicine?  insulin and other medicines for diabetes This list may not describe all possible interactions. Give your health care provider a list of all the medicines, herbs, non-prescription drugs, or dietary supplements you use. Also tell them if you smoke, drink alcohol, or use illegal drugs. Some items may interact with your medicine. What should I watch for while using this medicine? Visit your doctor or health care professional for regular checks on your progress. This medicine is intended to be used in addition to a healthy diet and appropriate exercise. The best results are achieved this way. Do not increase or in any way change your dose without consulting your doctor or health care professional. Drink plenty of fluids while taking this medicine. Check with your doctor or health care professional if you get an attack of severe diarrhea, nausea, and vomiting. The loss of too much body fluid can make it dangerous for you to take this medicine. This medicine may   affect blood sugar levels. If you have diabetes, check with your doctor or health care professional before you change your diet or the dose of your diabetic medicine. Patients and their families should watch out for worsening depression or thoughts of suicide. Also watch out for sudden changes in feelings such as feeling anxious, agitated, panicky, irritable, hostile, aggressive, impulsive, severely  restless, overly excited and hyperactive, or not being able to sleep. If this happens, especially at the beginning of treatment or after a change in dose, call your health care professional. What side effects may I notice from receiving this medicine? Side effects that you should report to your doctor or health care professional as soon as possible:  allergic reactions like skin rash, itching or hives, swelling of the face, lips, or tongue  breathing problems  diarrhea that continues or is severe  lump or swelling on the neck  severe nausea  signs and symptoms of infection like fever or chills; cough; sore throat; pain or trouble passing urine  signs and symptoms of low blood sugar such as feeling anxious, confusion, dizziness, increased hunger, unusually weak or tired, sweating, shakiness, cold, irritable, headache, blurred vision, fast heartbeat, loss of consciousness  signs and symptoms of kidney injury like trouble passing urine or change in the amount of urine  trouble swallowing  unusual stomach upset or pain  vomiting Side effects that usually do not require medical attention (report to your doctor or health care professional if they continue or are bothersome):  constipation  decreased appetite  diarrhea  fatigue  headache  nausea  pain, redness, or irritation at site where injected  stomach upset  stuffy or runny nose This list may not describe all possible side effects. Call your doctor for medical advice about side effects. You may report side effects to FDA at 1-800-FDA-1088. Where should I keep my medicine? Keep out of the reach of children. Store unopened pen in a refrigerator between 2 and 8 degrees C (36 and 46 degrees F). Do not freeze or use if the medicine has been frozen. Protect from light and excessive heat. After you first use the pen, it can be stored at room temperature between 15 and 30 degrees C (59 and 86 degrees F) or in a refrigerator.  Throw away your used pen after 30 days or after the expiration date, whichever comes first. Do not store your pen with the needle attached. If the needle is left on, medicine may leak from the pen. NOTE: This sheet is a summary. It may not cover all possible information. If you have questions about this medicine, talk to your doctor, pharmacist, or health care provider.  2020 Elsevier/Gold Standard (2019-01-01 17:10:35)  

## 2019-11-23 NOTE — Progress Notes (Signed)
Consultant: Merrie Roof, PA Reason: Obesity  HPI:  Patient is a 28 y.o. [id number] presenting for evaluation of abnormal weight gain.  The patient has gained many pounds over the past months following delivery last year, and has had a weight problem for many years..  She feels she has gained weight due to problems with her nutrition and physical activity.  She has these associated symptoms: arthralgia, fatigue and morning stiffness.  She has tried prescription appetite suppressants: (Topamax), self-directed dieting, supervised diet program, very low calorie diet and Weight Watchers in the past with little success.  PMHx: She  has a past medical history of Anemia, Anxiety, Bipolar 1 disorder ([place]), Depression, GERD (gastroesophageal reflux disease), Headache, Hepatitis B affecting pregnancy (2007), and Hypertension. Also,  has a past surgical history that includes No past surgeries; No past surgeries; Laparoscopic tubal ligation (N/A, 07/30/2018); and Tubal ligation (07/30/2018)., family history includes Diabetes in her maternal grandmother; Hypertension in her brother, maternal grandfather, maternal grandmother, paternal grandfather, paternal grandmother, and sister.,  reports that she has never smoked. She has never used smokeless tobacco. She reports current alcohol use. She reports that she does not use drugs.  She has a current medication list which includes the following prescription(s): drysol, hydrochlorothiazide, ibuprofen, ketoconazole, prenatal multivitamin, sprintec 28, novofine, saxenda, metronidazole, and contrave. Also, is allergic to penicillins.  Review of Systems  Constitutional: Positive for malaise/fatigue. Negative for chills and fever.  HENT: Negative for congestion, sinus pain and sore throat.   Eyes: Negative for blurred vision and pain.  Respiratory: Negative for cough and wheezing.   Cardiovascular: Negative for chest pain and leg swelling.  Gastrointestinal: Negative for  abdominal pain, constipation, diarrhea, heartburn, nausea and vomiting.  Genitourinary: Negative for dysuria, frequency, hematuria and urgency.  Musculoskeletal: Positive for joint pain and myalgias. Negative for back pain and neck pain.  Skin: Negative for itching and rash.  Neurological: Negative for dizziness, tremors and weakness.  Endo/Heme/Allergies: Does not bruise/bleed easily.  Psychiatric/Behavioral: Negative for depression. The patient is not nervous/anxious and does not have insomnia.     Objective: BP 140/90   Pulse 97   Ht 5\' 5"  (1.651 m)   Wt (!) 312 lb (141.5 kg)   LMP 11/03/2019   BMI 51.92 kg/m  Physical Exam Constitutional:      General: She is not in acute distress.    Appearance: She is well-developed.  Musculoskeletal:        General: Normal range of motion.  Neurological:     Mental Status: She is alert and oriented to person, place, and time.  Skin:    General: Skin is warm and dry.  Vitals reviewed.    ASSESSMENT:  morbid obesity, BMI 51  Plan: Will assist patient in incorporating positive experiences into her life to promote a positive mental attitude.  Education given regarding appropriate lifestyle changes for weight loss, including regular physical activity, healthy coping strategies, caloric restriction, and healthy eating patterns.  Surgery options also discussed.  Patient is started on Saxenda.  This is a safer choice with HTN a concern for her.   The risks and benefits as well as side effects of medication, such as Phenteramine or Tenuate, is discussed.  The pros and cons of suppressing appetite and boosting metabolism is counseled.  Risks of tolerance and addiction discussed.  Use of medicine will be short term.  Pt to call with any negative side effects and agrees to keep follow up appointments.  A total of 20 minutes  were spent face-to-face with the patient during this encounter and over half of that time dealt with counseling and  coordination of care.  Annamarie Major, MD, [facility], [facility] 11/23/2019  2:00 PM

## 2019-11-24 ENCOUNTER — Other Ambulatory Visit: Payer: Self-pay | Admitting: Family Medicine

## 2019-11-24 MED ORDER — FLUCONAZOLE 150 MG PO TABS: 150.0000 mg | ORAL_TABLET | Freq: Once | ORAL | 0 refills | Status: AC

## 2019-11-24 MED ORDER — CLINDAMYCIN HCL 150 MG PO CAPS: 150.0000 mg | ORAL_CAPSULE | Freq: Two times a day (BID) | ORAL | 0 refills | Status: DC

## 2019-11-24 NOTE — Telephone Encounter (Signed)
Noted, looks like it was prescribed today by GYN

## 2019-11-26 ENCOUNTER — Ambulatory Visit: Payer: Managed Care, Other (non HMO) | Admitting: Family Medicine

## 2019-12-02 ENCOUNTER — Emergency Department
Admission: EM | Admit: 2019-12-02 | Discharge: 2019-12-02 | Disposition: A | Discharge status: Home / Self Care | Payer: Managed Care, Other (non HMO) | Attending: Student | Admitting: Student

## 2019-12-02 ENCOUNTER — Encounter: Payer: Self-pay | Admitting: Emergency Medicine

## 2019-12-02 ENCOUNTER — Other Ambulatory Visit: Payer: Self-pay

## 2019-12-02 DIAGNOSIS — R519 Headache, unspecified: Secondary | ICD-10-CM | POA: Diagnosis present

## 2019-12-02 DIAGNOSIS — J069 Acute upper respiratory infection, unspecified: Secondary | ICD-10-CM | POA: Diagnosis not present

## 2019-12-02 DIAGNOSIS — Z79899 Other long term (current) drug therapy: Secondary | ICD-10-CM | POA: Diagnosis not present

## 2019-12-02 DIAGNOSIS — Z20818 Contact with and (suspected) exposure to other bacterial communicable diseases: Secondary | ICD-10-CM | POA: Diagnosis not present

## 2019-12-02 DIAGNOSIS — I10 Essential (primary) hypertension: Secondary | ICD-10-CM | POA: Diagnosis not present

## 2019-12-02 MED ORDER — AZITHROMYCIN 250 MG PO TABS: ORAL_TABLET | ORAL | 0 refills | Status: DC

## 2019-12-02 NOTE — Discharge Instructions (Signed)
Take tylenol or ibuprofen for pain or fever.  See primary care if not improving over the week.  Return to the ER for symptoms that change or worsen if unable to schedule an appointment.

## 2019-12-02 NOTE — ED Triage Notes (Signed)
Fatigued, headache , congestion , sore throat  x1 day  , all 4 of her children dx with strep

## 2019-12-02 NOTE — ED Notes (Signed)

## 2019-12-02 NOTE — ED Provider Notes (Signed)
[facility] Emergency Department Provider Note ____________________________________________   First MD Initiated Contact with Patient 12/02/19 1159     (approximate)  I have reviewed the triage vital signs and the nursing notes.   HISTORY  Chief Complaint URI  HPI Regina Chandler is a 28 y.o. female presents to the emergency department for treatment and evaluation of fatigue, headache, congestion, and sore throat for the past 24 hours.  4 of her children have strep throat.  No known fever.  No alleviating measures attempted prior to arrival.     Past Medical History:  Diagnosis Date  . Anemia   . Anxiety   . Bipolar 1 disorder (HCC)   . Depression   . GERD (gastroesophageal reflux disease)    NO MEDS  . Headache   . Hepatitis B affecting pregnancy 2007   No current treatment required, titers show up negative  . Hypertension     Patient Active Problem List   Diagnosis Date Noted  . Morbid obesity with BMI of 50.0-59.9, adult (HCC) 11/23/2019  . Hypertension   . Bilateral leg edema 09/07/2018  . Obesity 09/07/2018  . Chlamydia infection affecting pregnancy 04/23/2018  . Elevated blood pressure affecting pregnancy in third trimester, antepartum 04/03/2018  . Back pain affecting pregnancy in second trimester 12/19/2017  . Chronic hepatitis B affecting antepartum care of mother [facility](HCC) 12/19/2017  . BMI 45.0-49.9, adult (HCC) 12/19/2017  . Headache in pregnancy, antepartum, third trimester 11/24/2016    Past Surgical History:  Procedure Laterality Date  . LAPAROSCOPIC TUBAL LIGATION N/A 07/30/2018   Procedure: LAPAROSCOPIC TUBAL LIGATION;  Surgeon: Vena AustriaStaebler, Andreas, MD;  Location: [facility] ORS;  Service: Gynecology;  Laterality: N/A;  . NO PAST SURGERIES    . NO PAST SURGERIES    . TUBAL LIGATION  07/30/2018   [facility](Staebler)    Prior to Admission medications   Medication Sig Start Date End Date Taking? Authorizing Provider  aluminum  chloride (DRYSOL) 20 % external solution Apply topically at bedtime. 08/06/19   Particia NearingLane, Rachel Elizabeth, PA-C  azithromycin (ZITHROMAX) 250 MG tablet 2 tablets today, then 1 tablet for the next 4 days. 12/02/19   Mcgwire Dasaro, Rulon Eisenmengerari B, FNP  clindamycin (CLEOCIN) 150 MG capsule Take 1 capsule (150 mg total) by mouth 2 (two) times daily. 11/24/19   Particia NearingLane, Rachel Elizabeth, PA-C  hydrochlorothiazide (HYDRODIURIL) 12.5 MG tablet TAKE 1 TABLET BY MOUTH EVERY DAY 09/23/19   Vena AustriaStaebler, Andreas, MD  ibuprofen (ADVIL) 800 MG tablet Take 1 tablet (800 mg total) by mouth every 8 (eight) hours as needed. 11/12/19   Particia NearingLane, Rachel Elizabeth, PA-C  Insulin Pen Needle (NOVOFINE) 32G X 6 MM MISC 1 application by Does not apply route daily. 11/23/19   Nadara MustardHarris, Robert P, MD  ketoconazole (NIZORAL) 2 % cream APPLY TO AFFECTED AREA TWICE A DAY 10/24/19   Particia NearingLane, Rachel Elizabeth, PA-C  Liraglutide -Weight Management (SAXENDA) 18 MG/3ML SOPN Inject 3 mg into the skin daily. 11/23/19   Nadara MustardHarris, Robert P, MD  Prenatal Vit-Fe Fumarate-FA (PRENATAL MULTIVITAMIN) TABS tablet Take 1 tablet by mouth daily.     [provider]  SPRINTEC 28 0.25-35 MG-MCG tablet TAKE 1 TABLET BY MOUTH EVERY DAY 10/24/19   Particia NearingLane, Rachel Elizabeth, PA-C    Allergies Penicillins  Family History  Problem Relation Age of Onset  . Hypertension Sister   . Hypertension Brother   . Hypertension Maternal Grandmother   . Diabetes Maternal Grandmother   . Hypertension Maternal Grandfather   .  Hypertension Paternal Grandmother   . Hypertension Paternal Grandfather   . Cancer Neg Hx   . Heart disease Neg Hx   . Stroke Neg Hx     Social History Social History   Tobacco Use  . Smoking status: Never Smoker  . Smokeless tobacco: Never Used  Substance Use Topics  . Alcohol use: Yes    Comment: RARE  . Drug use: No    Review of Systems  Constitutional: No fever/chills Eyes: No visual changes. ENT: Positive for sore throat. Cardiovascular:  Denies chest pain. Respiratory: Denies shortness of breath.  Positive for cough Gastrointestinal: No abdominal pain.  No nausea, no vomiting.  No diarrhea.  No constipation. Genitourinary: Negative for dysuria. Musculoskeletal: Negative for back pain. Skin: Negative for rash. Neurological: Positive for headaches, negative focal weakness or numbness. ____________________________________________   PHYSICAL EXAM:  VITAL SIGNS: ED Triage Vitals [12/02/19 1036]  Enc Vitals Group     BP (!) 149/94     Pulse Rate 77     Resp 18     Temp 99.3 F (37.4 C)     Temp Source Oral     SpO2 100 %     Weight (!) 309 lb (140.2 kg)     Height 5\' 5"  (1.651 m)     Head Circumference      Peak Flow      Pain Score 7     Pain Loc      Pain Edu?      Excl. in GC?     Constitutional: Alert and oriented. Well appearing and in no acute distress. Eyes: Conjunctivae are normal. Head: Atraumatic. Nose: No congestion/rhinnorhea. Mouth/Throat: Mucous membranes are moist.  Oropharynx erythematous.  Tonsils are 1+ without exudate. Neck: No stridor.   Hematological/Lymphatic/Immunilogical: Bilateral anterior cervical lymphadenopathy. Cardiovascular: Normal rate, regular rhythm. Grossly normal heart sounds.  Good peripheral circulation. Respiratory: Normal respiratory effort.  No retractions. Lungs CTAB. Gastrointestinal: Soft and nontender. No distention. No abdominal bruits. No CVA tenderness. Genitourinary:  Musculoskeletal: No lower extremity tenderness nor edema.  No joint effusions. Neurologic:  Normal speech and language. No gross focal neurologic deficits are appreciated. No gait instability. Skin:  Skin is warm, dry and intact. No rash noted. Psychiatric: Mood and affect are normal. Speech and behavior are normal.  ____________________________________________   LABS (all labs ordered are listed, but only abnormal results are displayed)  Labs Reviewed - No data to  display ____________________________________________  EKG  Not indicated ____________________________________________  RADIOLOGY  ED MD interpretation:    Not indicated  Official radiology report(s): No results found.  ____________________________________________   PROCEDURES  Procedure(s) performed (including Critical Care):  Procedures  ____________________________________________   INITIAL IMPRESSION / ASSESSMENT AND PLAN     28 year old female presenting to the emergency department for treatment and evaluation of symptoms as listed in the HPI.  DIFFERENTIAL DIAGNOSIS  Streptococcal pharyngitis, viral URI, COVID-19  ED COURSE  Because of her significant strep exposure, she will be placed on antibiotics.  Symptoms and exam seem more viral.  Patient was advised that the antibiotics may not necessarily help if this is in fact viral instead of bacterial.  She is to take Tylenol or ibuprofen for pain or fever.    She was advised that she should follow-up with her primary care provider if she is not improving over the week.  She is to return to the emergency department for symptoms that change or worsen if he is unable to schedule appointment. ____________________________________________  FINAL CLINICAL IMPRESSION(S) / ED DIAGNOSES  Final diagnoses:  Exposure to strep throat  Upper respiratory tract infection, unspecified type     ED Discharge Orders         Ordered    azithromycin (ZITHROMAX) 250 MG tablet     12/02/19 1227           Note:  This document was prepared using Dragon voice recognition software and may include unintentional dictation errors.   Victorino Dike, FNP 12/02/19 1355    Lilia Pro., MD 12/02/19 1901

## 2019-12-17 ENCOUNTER — Other Ambulatory Visit: Payer: Self-pay

## 2019-12-17 ENCOUNTER — Encounter: Payer: Self-pay | Admitting: Family Medicine

## 2019-12-17 ENCOUNTER — Ambulatory Visit (INDEPENDENT_AMBULATORY_CARE_PROVIDER_SITE_OTHER): Payer: Managed Care, Other (non HMO) | Admitting: Family Medicine

## 2019-12-17 ENCOUNTER — Ambulatory Visit: Payer: Managed Care, Other (non HMO) | Admitting: Family Medicine

## 2019-12-17 VITALS — BP 154/96 | HR 106 | Ht 65.0 in | Wt 307.1 lb

## 2019-12-17 DIAGNOSIS — I10 Essential (primary) hypertension: Secondary | ICD-10-CM

## 2019-12-17 DIAGNOSIS — Z6841 Body Mass Index (BMI) 40.0 and over, adult: Secondary | ICD-10-CM

## 2019-12-17 MED ORDER — KETOCONAZOLE 2 % EX CREA: TOPICAL_CREAM | CUTANEOUS | 2 refills | Status: DC

## 2019-12-17 MED ORDER — OMEPRAZOLE 40 MG PO CPDR: 40.0000 mg | DELAYED_RELEASE_CAPSULE | Freq: Every day | ORAL | 3 refills | Status: DC

## 2019-12-17 MED ORDER — HYDROCHLOROTHIAZIDE 25 MG PO TABS: 25.0000 mg | ORAL_TABLET | Freq: Every day | ORAL | 0 refills | Status: DC

## 2019-12-17 NOTE — Progress Notes (Signed)
BP (!) 154/96   Pulse (!) 106   Ht 5\' 5"  (1.651 m)   Wt (!) 307 lb 2 oz (139.3 kg)   BMI 51.11 kg/m    Subjective:    Patient ID: , female    DOB: February 21, 1991, 29 y.o.   MRN: [id number]  HPI: Regina Chandler is a 29 y.o. female  Chief Complaint  Patient presents with  . Hypertension  . Weight Check    . This visit was completed via WebEx due to the restrictions of the COVID-19 pandemic. All issues as above were discussed and addressed. Physical exam was done as above through visual confirmation on WebEx. If it was felt that the patient should be evaluated in the office, they were directed there. The patient verbally consented to this visit. . Location of the patient: home . Location of the provider: home . Those involved with this call:  . Provider: 26, PA-C . CMA: Roosvelt Maser, CMA . Front Desk/Registration: Elton Sin  . Time spent on call: 15 minutes with patient face to face via video conference. More than 50% of this time was spent in counseling and coordination of care. 5 minutes total spent in review of patient's record and preparation of their chart. I verified patient identity using two factors (patient name and date of birth). Patient consents verbally to being seen via telemedicine visit today.   Patient presenting today for HTN and weight f/u.   Home BPs have been running around 140s-150s/90s typically. Taking the HCTZ faithfully without side effects. Denies CP, SOB, HAs, dizziness.   Taking the saxenda, tolerating well and feeling like overall it's helping control appetite. Tolerating well other than having "terrible egg burps" intermittently throughout the day no matter how little or how slow Regina Chandler eats. Has not noticed a significant weight change, only down about 2 lb so far. Trying to improve the quality of foods Regina Chandler eats.   Relevant past medical, surgical, family and social history reviewed and updated as indicated. Interim medical  history since our last visit reviewed. Allergies and medications reviewed and updated.  Review of Systems  Per HPI unless specifically indicated above     Objective:    BP (!) 154/96   Pulse (!) 106   Ht 5\' 5"  (1.651 m)   Wt (!) 307 lb 2 oz (139.3 kg)   BMI 51.11 kg/m   Wt Readings from Last 3 Encounters:  12/17/19 (!) 307 lb 2 oz (139.3 kg)  12/02/19 (!) 309 lb (140.2 kg)  11/23/19 (!) 312 lb (141.5 kg)    Physical Exam Vitals and nursing note reviewed.  Constitutional:      General: Regina Chandler is not in acute distress.    Appearance: Normal appearance.  HENT:     Head: Atraumatic.     Right Ear: External ear normal.     Left Ear: External ear normal.     Nose: Nose normal. No congestion.     Mouth/Throat:     Mouth: Mucous membranes are moist.     Pharynx: Oropharynx is clear. No posterior oropharyngeal erythema.  Eyes:     Extraocular Movements: Extraocular movements intact.     Conjunctiva/sclera: Conjunctivae normal.  Cardiovascular:     Comments: Unable to assess via virtual visit Pulmonary:     Effort: Pulmonary effort is normal. No respiratory distress.  Musculoskeletal:        General: Normal range of motion.     Cervical back: Normal range of  motion.  Skin:    General: Skin is dry.     Findings: No erythema.  Neurological:     Mental Status: Regina Chandler is alert and oriented to person, place, and time.  Psychiatric:        Mood and Affect: Mood normal.        Thought Content: Thought content normal.        Judgment: Judgment normal.     Results for orders placed or performed in visit on 11/12/19  WET PREP FOR [place], YEAST, CLUE   Specimen: Vaginal Fluid   VAGINAL FLUI  Result Value Ref Range   Trichomonas Exam Negative Negative   Yeast Exam Negative Negative   Clue Cell Exam Positive (A) Negative  UA/M w/rflx Culture, Routine   Specimen: Vaginal Fluid   VAGINAL FLUI  Result Value Ref Range   Specific Gravity, UA >1.030 (H) 1.005 - 1.030   pH, UA 5.5 5.0  - 7.5   Color, UA Yellow Yellow   Appearance Ur Hazy (A) Clear   Leukocytes,UA Negative Negative   Protein,UA Negative Negative/Trace   Glucose, UA Negative Negative   Ketones, UA Negative Negative   RBC, UA Negative Negative   Bilirubin, UA Negative Negative   Urobilinogen, Ur 0.2 0.2 - 1.0 mg/dL   Nitrite, UA Negative Negative      Assessment & Plan:   Problem List Items Addressed This Visit      Cardiovascular and Mediastinum   Hypertension - Primary    BPs not under good control, increase to 25 mg HCTZ and continue close home monitoring. F/u in 1 month for recheck. Continue working on weight loss, DASH diet in meantime      Relevant Medications   hydrochlorothiazide (HYDRODIURIL) 25 MG tablet     Other   Morbid obesity with BMI of 50.0-59.9, adult ([place])    Tolerating saxenda well and trying to make good lifestyle changes. Will continue to monitor closely . Trial prilosec to see if this helps with the belching side effects Regina Chandler's having          Follow up plan: Return in about 4 weeks (around 01/14/2020) for HTN, weight check .

## 2019-12-20 NOTE — Assessment & Plan Note (Signed)
BPs not under good control, increase to 25 mg HCTZ and continue close home monitoring. F/u in 1 month for recheck. Continue working on weight loss, DASH diet in meantime

## 2019-12-20 NOTE — Assessment & Plan Note (Addendum)
Tolerating saxenda well and trying to make good lifestyle changes. Will continue to monitor closely . Trial prilosec to see if this helps with the belching side effects she's having

## 2019-12-21 ENCOUNTER — Encounter: Payer: Self-pay | Admitting: Obstetrics & Gynecology

## 2019-12-21 ENCOUNTER — Other Ambulatory Visit: Payer: Self-pay

## 2019-12-21 ENCOUNTER — Ambulatory Visit (INDEPENDENT_AMBULATORY_CARE_PROVIDER_SITE_OTHER): Payer: Managed Care, Other (non HMO) | Admitting: Obstetrics & Gynecology

## 2019-12-21 DIAGNOSIS — Z6841 Body Mass Index (BMI) 40.0 and over, adult: Secondary | ICD-10-CM | POA: Diagnosis not present

## 2019-12-21 MED ORDER — SAXENDA 18 MG/3ML ~~LOC~~ SOPN: 3.0000 mg | PEN_INJECTOR | Freq: Every day | SUBCUTANEOUS | 2 refills | Status: DC

## 2019-12-21 MED ORDER — NOVOFINE 32G X 6 MM MISC: 1.0000 "application " | Freq: Every day | 3 refills | Status: DC

## 2019-12-21 NOTE — Progress Notes (Signed)
Virtual Visit via Telephone Note  I connected with Regina Chandler on 12/21/19 at  9:20 AM EST by telephone and verified that I am speaking with the correct person using two identifiers.   I discussed the limitations, risks, security and privacy concerns of performing an evaluation and management service by telephone and the availability of in person appointments. I also discussed with the patient that there may be a patient responsible charge related to this service. The patient expressed understanding and agreed to proceed. She was at home and I was in my office.  History of Present Illness:  Regina Chandler is a 29 y.o. who was started on Saxenda approximately 1 month ago due to obesity/abnormal weight gain. The patient has lost 5 pounds over the past month due to medication and lifestyle changes..   She has these side effects: none.  PMHx: She  has a past medical history of Anemia, Anxiety, Bipolar 1 disorder (HCC), Depression, GERD (gastroesophageal reflux disease), Headache, Hepatitis B affecting pregnancy (2007), and Hypertension. Also,  has a past surgical history that includes No past surgeries; No past surgeries; Laparoscopic tubal ligation (N/A, 07/30/2018); and Tubal ligation (07/30/2018)., family history includes Diabetes in her maternal grandmother; Hypertension in her brother, maternal grandfather, maternal grandmother, paternal grandfather, paternal grandmother, and sister.,  reports that she has never smoked. She has never used smokeless tobacco. She reports current alcohol use. She reports that she does not use drugs.  She has a current medication list which includes the following prescription(s): drysol, hydrochlorothiazide, ibuprofen, novofine, ketoconazole, saxenda, omeprazole, prenatal multivitamin, and sprintec 28. Also, is allergic to penicillins.  Review of Systems  All other systems reviewed and are negative.    Observations/Objective: No exam today, due to telephone  eVisit due to [facility] virus restriction on elective visits and procedures.  Prior visits reviewed along with ultrasounds/labs as indicated. Reported: Ht 5\' 5"  (1.651 m)   Wt (!) 307 lb (139.3 kg)   LMP 12/07/2019   BMI 51.09 kg/m   Assessment and Plan: 1. Morbid obesity with BMI of 50.0-59.9, adult (HCC) Medication treatment is going adequately for her.  Plan: Patient is continued/added to prescription appetite suppressants: Saxenda, self-directed dieting and exercise.   Will continue to assist patient in incorporating positive experiences into her life to promote a positive mental attitude.  Education given regarding appropriate lifestyle changes for weight loss, including regular physical activity, healthy coping strategies, caloric restriction, and healthy eating patterns.  The risks and benefits as well as side effects of medication, such as Phenteramine or Tenuate, is discussed.  The pros and cons of suppressing appetite and boosting metabolism is counseled.  Risks of tolerance and addiction discussed.  Use of medicine will be short term.  Pt to call with any negative side effects and agrees to keep follow up appointments.   Follow Up Instructions: 3 mos   I discussed the assessment and treatment plan with the patient. The patient was provided an opportunity to ask questions and all were answered. The patient agreed with the plan and demonstrated an understanding of the instructions.   The patient was advised to call back or seek an in-person evaluation if the symptoms worsen or if the condition fails to improve as anticipated.  I provided 12 minutes of non-face-to-face time during this encounter.   11-23-1992, MD

## 2019-12-22 ENCOUNTER — Encounter: Payer: Self-pay | Admitting: Family Medicine

## 2019-12-22 NOTE — Progress Notes (Signed)
Mail box full unable to leave voicemail. Sent letter.

## 2020-01-02 ENCOUNTER — Other Ambulatory Visit: Payer: Self-pay

## 2020-01-02 ENCOUNTER — Emergency Department
Admission: EM | Admit: 2020-01-02 | Discharge: 2020-01-02 | Disposition: A | Discharge status: Home / Self Care | Payer: Managed Care, Other (non HMO) | Attending: Emergency Medicine | Admitting: Emergency Medicine

## 2020-01-02 ENCOUNTER — Encounter: Payer: Self-pay | Admitting: Emergency Medicine

## 2020-01-02 DIAGNOSIS — I10 Essential (primary) hypertension: Secondary | ICD-10-CM | POA: Diagnosis not present

## 2020-01-02 DIAGNOSIS — M7918 Myalgia, other site: Secondary | ICD-10-CM | POA: Diagnosis not present

## 2020-01-02 DIAGNOSIS — Z20822 Contact with and (suspected) exposure to covid-19: Secondary | ICD-10-CM | POA: Insufficient documentation

## 2020-01-02 DIAGNOSIS — R519 Headache, unspecified: Secondary | ICD-10-CM | POA: Diagnosis not present

## 2020-01-02 DIAGNOSIS — B349 Viral infection, unspecified: Secondary | ICD-10-CM

## 2020-01-02 DIAGNOSIS — J029 Acute pharyngitis, unspecified: Secondary | ICD-10-CM | POA: Diagnosis present

## 2020-01-02 DIAGNOSIS — R509 Fever, unspecified: Secondary | ICD-10-CM | POA: Diagnosis not present

## 2020-01-02 LAB — SARS CORONAVIRUS 2 (TAT 6-24 HRS): SARS Coronavirus 2: NEGATIVE

## 2020-01-02 NOTE — ED Triage Notes (Signed)
Pt arrived via POV with reports of sxs that started Thursday, with HA, body aches, sore throat while at work.  Denies fevers.    Pt states she is taking ibuprofen for the pain.

## 2020-01-02 NOTE — ED Provider Notes (Addendum)
[facility] Emergency Department Provider Note  ____________________________________________   First MD Initiated Contact with Patient 01/02/20 1105     (approximate)  I have reviewed the triage vital signs and the nursing notes.   HISTORY  Chief Complaint Generalized Body Aches and Sore Throat   HPI Regina Chandler is a 29 y.o. female presents to the ED with complaint of headache, body aches and sore throat.  Patient is unaware of any known fever because she has been taking over-the-counter medication.  She has subjective fever and chills.  She also reports some intermittent diarrhea.  She is worried as she was exposed to someone at work who was positive for Covid.  Patient states that she was tested for work last fall and was negative.  She occasionally has a cough but denies any shortness of breath.  She rates her pain as a 9 out of 10.     Past Medical History:  Diagnosis Date  . Anemia   . Anxiety   . Bipolar 1 disorder (HCC)   . Depression   . GERD (gastroesophageal reflux disease)    NO MEDS  . Headache   . Hepatitis B affecting pregnancy 2007   No current treatment required, titers show up negative  . Hypertension     Patient Active Problem List   Diagnosis Date Noted  . Morbid obesity with BMI of 50.0-59.9, adult (HCC) 11/23/2019  . Hypertension   . Bilateral leg edema 09/07/2018  . Obesity 09/07/2018  . Chlamydia infection affecting pregnancy 04/23/2018  . Elevated blood pressure affecting pregnancy in third trimester, antepartum 04/03/2018  . Back pain affecting pregnancy in second trimester 12/19/2017  . Chronic hepatitis B affecting antepartum care of mother [facility]) 12/19/2017  . BMI 45.0-49.9, adult (HCC) 12/19/2017  . Headache in pregnancy, antepartum, third trimester 11/24/2016    Past Surgical History:  Procedure Laterality Date  . LAPAROSCOPIC TUBAL LIGATION N/A 07/30/2018   Procedure: LAPAROSCOPIC TUBAL LIGATION;  Surgeon:  Vena Austria, MD;  Location: [facility] ORS;  Service: Gynecology;  Laterality: N/A;  . NO PAST SURGERIES    . NO PAST SURGERIES    . TUBAL LIGATION  07/30/2018   [facility])    Prior to Admission medications   Medication Sig Start Date End Date Taking? Authorizing Provider  aluminum chloride (DRYSOL) 20 % external solution Apply topically at bedtime. 08/06/19   Particia Nearing, PA-C  hydrochlorothiazide (HYDRODIURIL) 25 MG tablet Take 1 tablet (25 mg total) by mouth daily. 12/17/19   Particia Nearing, PA-C  ibuprofen (ADVIL) 800 MG tablet Take 1 tablet (800 mg total) by mouth every 8 (eight) hours as needed. 11/12/19   Particia Nearing, PA-C  Insulin Pen Needle (NOVOFINE) 32G X 6 MM MISC 1 application by Does not apply route daily. 12/21/19   Nadara Mustard, MD  ketoconazole (NIZORAL) 2 % cream APPLY TO AFFECTED AREA TWICE A DAY 12/17/19   Particia Nearing, PA-C  Liraglutide -Weight Management (SAXENDA) 18 MG/3ML SOPN Inject 3 mg into the skin daily. 12/21/19   Nadara Mustard, MD  omeprazole (PRILOSEC) 40 MG capsule Take 1 capsule (40 mg total) by mouth daily. 12/17/19   Particia Nearing, PA-C  SPRINTEC 28 0.25-35 MG-MCG tablet TAKE 1 TABLET BY MOUTH EVERY DAY 10/24/19   Particia Nearing, PA-C    Allergies Penicillins  Family History  Problem Relation Age of Onset  . Hypertension Sister   . Hypertension Brother   .  Hypertension Maternal Grandmother   . Diabetes Maternal Grandmother   . Hypertension Maternal Grandfather   . Hypertension Paternal Grandmother   . Hypertension Paternal Grandfather   . Cancer Neg Hx   . Heart disease Neg Hx   . Stroke Neg Hx     Social History Social History   Tobacco Use  . Smoking status: Never Smoker  . Smokeless tobacco: Never Used  Substance Use Topics  . Alcohol use: Yes    Comment: RARE  . Drug use: No    Review of Systems Constitutional: No fever/chills Eyes: No visual changes. ENT: Positive  sore throat. Cardiovascular: Denies chest pain. Respiratory: Denies shortness of breath.  Positive cough. Gastrointestinal: No abdominal pain.  No nausea, no vomiting.  Positive diarrhea.  No constipation. Genitourinary: Negative for dysuria. Musculoskeletal: Positive muscle aches. Skin: Negative for rash. Neurological: Negative for headaches, focal weakness or numbness. ____________________________________________   PHYSICAL EXAM:  VITAL SIGNS: ED Triage Vitals  Enc Vitals Group     BP 01/02/20 1053 (!) 151/95     Pulse Rate 01/02/20 1053 77     Resp 01/02/20 1053 18     Temp 01/02/20 1053 98.9 F (37.2 C)     Temp Source 01/02/20 1053 Oral     SpO2 01/02/20 1053 98 %     Weight 01/02/20 1051 (!) 307 lb (139.3 kg)     Height 01/02/20 1051 5\' 5"  (1.651 m)     Head Circumference --      Peak Flow --      Pain Score 01/02/20 1050 9     Pain Loc --      Pain Edu? --      Excl. in GC? --     Constitutional: Alert and oriented. Well appearing and in no acute distress. Eyes: Conjunctivae are normal. Head: Atraumatic. Neck: No stridor.   Cardiovascular: Normal rate, regular rhythm. Grossly normal heart sounds.  Good peripheral circulation. Respiratory: Normal respiratory effort.  No retractions. Lungs CTAB. Gastrointestinal: Soft and nontender. No distention. Musculoskeletal: Moves upper and lower extremities with any difficulty.  Normal gait was noted. Neurologic:  Normal speech and language. No gross focal neurologic deficits are appreciated. No gait instability. Skin:  Skin is warm, dry and intact. No rash noted. Psychiatric: Mood and affect are normal. Speech and behavior are normal.  ____________________________________________   LABS (all labs ordered are listed, but only abnormal results are displayed)  Labs Reviewed  SARS CORONAVIRUS 2 (TAT 6-24 HRS)    PROCEDURES  Procedure(s) performed (including Critical  Care):  Procedures  ____________________________________________   INITIAL IMPRESSION / ASSESSMENT AND PLAN / ED COURSE  As part of my medical decision making, I reviewed the following data within the electronic MEDICAL RECORD NUMBER Notes from prior ED visits and [place] Controlled Substance Database  SAKI LEGORE was evaluated in Emergency Department on 01/02/2020 for the symptoms described in the history of present illness. She was evaluated in the context of the global COVID-19 pandemic, which necessitated consideration that the patient might be at risk for infection with the SARS-CoV-2 virus that causes COVID-19. Institutional protocols and algorithms that pertain to the evaluation of patients at risk for COVID-19 are in a state of rapid change based on information released by regulatory bodies including the CDC and federal and state organizations. These policies and algorithms were followed during the patient's care in the ED.  30 year old female presents to the ED with complaint of headache, body aches, sore throat and  subjective fever and chills for the last 3 days.  Patient states that she was exposed to Covid while at work.  She is here for a Covid test.  Patient also voices that she has had some diarrhea.  She and her boyfriend are both here with similar symptoms.  Patient is aware that her Covid test will result tomorrow.  She was given a note for work.  ____________________________________________   FINAL CLINICAL IMPRESSION(S) / ED DIAGNOSES  Final diagnoses:  Viral illness     ED Discharge Orders    None       Note:  This document was prepared using Dragon voice recognition software and may include unintentional dictation errors.    Johnn Hai, PA-C 01/02/20 1137    Johnn Hai, PA-C 01/02/20 1143    Johnn Hai, PA-C 01/02/20 1146    Lavonia Drafts, MD 01/02/20 1217

## 2020-01-02 NOTE — Discharge Instructions (Signed)
Follow-up with your primary care provider if any continued problems or concerns.  Take Tylenol if needed for fever.  Increase fluids.  You will need to quarantine until you have received the results of your test which should show up on my chart tomorrow.  If that is positive you will need an additional 10 days at home.

## 2020-01-02 NOTE — ED Notes (Signed)
See triage note  Presents with headache and body aches  States sxs' started on Thursday . Unsure of fever  But states she did take IBU on Thursday  Afebrile on arrival

## 2020-01-08 ENCOUNTER — Emergency Department: Payer: Managed Care, Other (non HMO)

## 2020-01-08 ENCOUNTER — Encounter: Payer: Self-pay | Admitting: Emergency Medicine

## 2020-01-08 ENCOUNTER — Other Ambulatory Visit: Payer: Self-pay

## 2020-01-08 ENCOUNTER — Emergency Department
Admission: EM | Admit: 2020-01-08 | Discharge: 2020-01-08 | Disposition: A | Discharge status: Home / Self Care | Payer: Managed Care, Other (non HMO) | Attending: Student in an Organized Health Care Education/Training Program | Admitting: Student in an Organized Health Care Education/Training Program

## 2020-01-08 DIAGNOSIS — R0602 Shortness of breath: Secondary | ICD-10-CM | POA: Diagnosis not present

## 2020-01-08 DIAGNOSIS — I10 Essential (primary) hypertension: Secondary | ICD-10-CM | POA: Insufficient documentation

## 2020-01-08 DIAGNOSIS — Z794 Long term (current) use of insulin: Secondary | ICD-10-CM | POA: Insufficient documentation

## 2020-01-08 DIAGNOSIS — M7918 Myalgia, other site: Secondary | ICD-10-CM | POA: Insufficient documentation

## 2020-01-08 DIAGNOSIS — R05 Cough: Secondary | ICD-10-CM | POA: Insufficient documentation

## 2020-01-08 DIAGNOSIS — R519 Headache, unspecified: Secondary | ICD-10-CM | POA: Diagnosis present

## 2020-01-08 DIAGNOSIS — R509 Fever, unspecified: Secondary | ICD-10-CM | POA: Insufficient documentation

## 2020-01-08 DIAGNOSIS — Z20822 Contact with and (suspected) exposure to covid-19: Secondary | ICD-10-CM | POA: Insufficient documentation

## 2020-01-08 DIAGNOSIS — Z79899 Other long term (current) drug therapy: Secondary | ICD-10-CM | POA: Diagnosis not present

## 2020-01-08 DIAGNOSIS — R197 Diarrhea, unspecified: Secondary | ICD-10-CM | POA: Diagnosis not present

## 2020-01-08 LAB — SARS CORONAVIRUS 2 (TAT 6-24 HRS): SARS Coronavirus 2: NEGATIVE

## 2020-01-08 MED ORDER — AZITHROMYCIN 250 MG PO TABS: ORAL_TABLET | ORAL | 0 refills | Status: DC

## 2020-01-08 MED ORDER — PREDNISONE 10 MG (21) PO TBPK: ORAL_TABLET | ORAL | 0 refills | Status: DC

## 2020-01-08 NOTE — ED Triage Notes (Signed)
Pt presents to ED via POV with her son who is also a patient, reports SO and child's father tested positive for Covid, received results on Wednesday that her Covid test was negative. Reports continued HA and generalized body aches at this time.

## 2020-01-08 NOTE — Discharge Instructions (Addendum)
Follow-up with your regular doctor if not improving in 3 to 4 days.  Take medication as prescribed.  Return emergency department if you are having difficulty breathing.

## 2020-01-08 NOTE — ED Notes (Signed)
See triage note- pt was exposed to someone who tested covid positive on Sunday- pt states her test came back negative but continues to feel bad

## 2020-01-08 NOTE — ED Provider Notes (Signed)
[facility] Emergency Department Provider Note  ____________________________________________   First MD Initiated Contact with Patient 01/08/20 1043     (approximate)  I have reviewed the triage vital signs and the nursing notes.   HISTORY  Chief Complaint Generalized Body Aches and Headache    HPI Regina Chandler is a 29 y.o. female presents emergency department complaint of headache, cough, body aches and fever.  Her husband is positive for Covid.  She did have a test on Sunday which is negative.  She states she only had a headache at that time.  She denies any vomiting but states she is having some diarrhea.  Denies chest pain or shortness of breath.    Past Medical History:  Diagnosis Date  . Anemia   . Anxiety   . Bipolar 1 disorder (HCC)   . Depression   . GERD (gastroesophageal reflux disease)    NO MEDS  . Headache   . Hepatitis B affecting pregnancy 2007   No current treatment required, titers show up negative  . Hypertension     Patient Active Problem List   Diagnosis Date Noted  . Morbid obesity with BMI of 50.0-59.9, adult (HCC) 11/23/2019  . Hypertension   . Bilateral leg edema 09/07/2018  . Obesity 09/07/2018  . Chlamydia infection affecting pregnancy 04/23/2018  . Elevated blood pressure affecting pregnancy in third trimester, antepartum 04/03/2018  . Back pain affecting pregnancy in second trimester 12/19/2017  . Chronic hepatitis B affecting antepartum care of mother [facility]) 12/19/2017  . BMI 45.0-49.9, adult (HCC) 12/19/2017  . Headache in pregnancy, antepartum, third trimester 11/24/2016    Past Surgical History:  Procedure Laterality Date  . LAPAROSCOPIC TUBAL LIGATION N/A 07/30/2018   Procedure: LAPAROSCOPIC TUBAL LIGATION;  Surgeon: Vena Austria, MD;  Location: [facility] ORS;  Service: Gynecology;  Laterality: N/A;  . NO PAST SURGERIES    . NO PAST SURGERIES    . TUBAL LIGATION  07/30/2018   [facility])     Prior to Admission medications   Medication Sig Start Date End Date Taking? Authorizing Provider  aluminum chloride (DRYSOL) 20 % external solution Apply topically at bedtime. 08/06/19   Particia Nearing, PA-C  azithromycin (ZITHROMAX Z-PAK) 250 MG tablet 2 pills today then 1 pill a day for 4 days 01/08/20   Sherrie Mustache Roselyn Bering, PA-C  hydrochlorothiazide (HYDRODIURIL) 25 MG tablet Take 1 tablet (25 mg total) by mouth daily. 12/17/19   Particia Nearing, PA-C  ibuprofen (ADVIL) 800 MG tablet Take 1 tablet (800 mg total) by mouth every 8 (eight) hours as needed. 11/12/19   Particia Nearing, PA-C  Insulin Pen Needle (NOVOFINE) 32G X 6 MM MISC 1 application by Does not apply route daily. 12/21/19   Nadara Mustard, MD  ketoconazole (NIZORAL) 2 % cream APPLY TO AFFECTED AREA TWICE A DAY 12/17/19   Particia Nearing, PA-C  Liraglutide -Weight Management (SAXENDA) 18 MG/3ML SOPN Inject 3 mg into the skin daily. 12/21/19   Nadara Mustard, MD  omeprazole (PRILOSEC) 40 MG capsule Take 1 capsule (40 mg total) by mouth daily. 12/17/19   Particia Nearing, PA-C  predniSONE (STERAPRED UNI-PAK 21 TAB) 10 MG (21) TBPK tablet Take 6 pills on day one then decrease by 1 pill each day 01/08/20   Faythe Ghee, PA-C  SPRINTEC 28 0.25-35 MG-MCG tablet TAKE 1 TABLET BY MOUTH EVERY DAY 10/24/19   Particia Nearing, PA-C    Allergies Penicillins  Family  History  Problem Relation Age of Onset  . Hypertension Sister   . Hypertension Brother   . Hypertension Maternal Grandmother   . Diabetes Maternal Grandmother   . Hypertension Maternal Grandfather   . Hypertension Paternal Grandmother   . Hypertension Paternal Grandfather   . Cancer Neg Hx   . Heart disease Neg Hx   . Stroke Neg Hx     Social History Social History   Tobacco Use  . Smoking status: Never Smoker  . Smokeless tobacco: Never Used  Substance Use Topics  . Alcohol use: Yes    Comment: RARE  . Drug use: No     Review of Systems  Constitutional: Positive fever/chills Eyes: No visual changes. ENT: No sore throat. Respiratory: Positive cough Cardiovascular: Denies chest pain Gastrointestinal: Denies abdominal pain, positive diarrhea Genitourinary: Negative for dysuria. Musculoskeletal: Negative for back pain. Skin: Negative for rash. Psychiatric: no mood changes,     ____________________________________________   PHYSICAL EXAM:  VITAL SIGNS: ED Triage Vitals  Enc Vitals Group     BP 01/08/20 1020 (!) 148/97     Pulse Rate 01/08/20 1020 93     Resp 01/08/20 1020 (!) 22     Temp 01/08/20 1020 98.4 F (36.9 C)     Temp Source 01/08/20 1020 Oral     SpO2 01/08/20 1020 94 %     Weight 01/08/20 1013 (!) 307 lb (139.3 kg)     Height 01/08/20 1013 5\' 5"  (1.651 m)     Head Circumference --      Peak Flow --      Pain Score 01/08/20 1013 8     Pain Loc --      Pain Edu? --      Excl. in GC? --     Constitutional: Alert and oriented. Well appearing and in no acute distress. Eyes: Conjunctivae are normal.  Head: Atraumatic. Nose: No congestion/rhinnorhea. Mouth/Throat: Mucous membranes are moist.   Neck:  supple no lymphadenopathy noted Cardiovascular: Normal rate, regular rhythm. Heart sounds are normal Respiratory: Normal respiratory effort.  No retractions, lungs c t a  GU: deferred Musculoskeletal: FROM all extremities, warm and well perfused Neurologic:  Normal speech and language.  Skin:  Skin is warm, dry and intact. No rash noted. Psychiatric: Mood and affect are normal. Speech and behavior are normal.  ____________________________________________   LABS (all labs ordered are listed, but only abnormal results are displayed)  Labs Reviewed  SARS CORONAVIRUS 2 (TAT 6-24 HRS)   ____________________________________________   ____________________________________________  RADIOLOGY  Chest x-ray is  negative  ____________________________________________   PROCEDURES  Procedure(s) performed: No  Procedures    ____________________________________________   INITIAL IMPRESSION / ASSESSMENT AND PLAN / ED COURSE  Pertinent labs & imaging results that were available during my care of the patient were reviewed by me and considered in my medical decision making (see chart for details).   Patient is 29 year old female presents emergency department with Covid concerns.  Patient appears well.  Vitals are basically normal.  Respirations are slightly elevated at 22, pulse ox is 94% room air.  Remainder exam is unremarkable  Chest x-ray Covid swab  Chest x-ray is negative, Covid swab pending  Explained the findings to the patient.  Since she has been experiencing worsening symptoms, I did place her on a Z-Pak and steroid pack.  She is to follow-up with her regular doctor if not improving in 3 days.  Return emergency department if worsening.  Explained to her  that she can have a negative test and still actually have Covid.  Due to her being exposed to her husband who is positive I feel that she most likely does have Covid.  She was discharged in stable condition.  She is to remain quarantined at home.    Regina Chandler was evaluated in Emergency Department on 01/08/2020 for the symptoms described in the history of present illness. She was evaluated in the context of the global COVID-19 pandemic, which necessitated consideration that the patient might be at risk for infection with the SARS-CoV-2 virus that causes COVID-19. Institutional protocols and algorithms that pertain to the evaluation of patients at risk for COVID-19 are in a state of rapid change based on information released by regulatory bodies including the CDC and federal and state organizations. These policies and algorithms were followed during the patient's care in the ED.   As part of my medical decision making, I reviewed  the following data within the [place] notes reviewed and incorporated, Old chart reviewed, Radiograph reviewed chest x-ray is normal, Notes from prior ED visits and [place] Controlled Substance Database  ____________________________________________   FINAL CLINICAL IMPRESSION(S) / ED DIAGNOSES  Final diagnoses:  Suspected COVID-19 virus infection      NEW MEDICATIONS STARTED DURING THIS VISIT:  New Prescriptions   AZITHROMYCIN (ZITHROMAX Z-PAK) 250 MG TABLET    2 pills today then 1 pill a day for 4 days   PREDNISONE (STERAPRED UNI-PAK 21 TAB) 10 MG (21) TBPK TABLET    Take 6 pills on day one then decrease by 1 pill each day     Note:  This document was prepared using Dragon voice recognition software and may include unintentional dictation errors.    Versie Starks, PA-C 01/08/20 1159    Merlyn Lot, MD 01/08/20 [id number]

## 2020-01-12 ENCOUNTER — Other Ambulatory Visit: Payer: Self-pay | Admitting: Family Medicine

## 2020-01-19 ENCOUNTER — Ambulatory Visit (INDEPENDENT_AMBULATORY_CARE_PROVIDER_SITE_OTHER): Payer: Managed Care, Other (non HMO) | Admitting: Family Medicine

## 2020-01-19 ENCOUNTER — Encounter: Payer: Self-pay | Admitting: Family Medicine

## 2020-01-19 VITALS — BP 187/70 | Ht 66.0 in | Wt 305.0 lb

## 2020-01-19 DIAGNOSIS — I10 Essential (primary) hypertension: Secondary | ICD-10-CM | POA: Diagnosis not present

## 2020-01-19 DIAGNOSIS — R600 Localized edema: Secondary | ICD-10-CM | POA: Diagnosis not present

## 2020-01-19 DIAGNOSIS — Z6841 Body Mass Index (BMI) 40.0 and over, adult: Secondary | ICD-10-CM

## 2020-01-19 MED ORDER — LISINOPRIL-HYDROCHLOROTHIAZIDE 20-25 MG PO TABS: 1.0000 | ORAL_TABLET | Freq: Every day | ORAL | 0 refills | Status: DC

## 2020-01-19 NOTE — Progress Notes (Signed)
Lvm for 1 month f/u for bp

## 2020-01-19 NOTE — Progress Notes (Signed)
BP (!) 187/70   Ht 5\' 6"  (1.676 m)   Wt (!) 305 lb (138.3 kg)   BMI 49.23 kg/m    Subjective:    Patient ID: Regina Chandler, female    DOB: [birth year], 29 y.o.   MRN: [id number]  HPI: Regina Chandler is a 29 y.o. female  Chief Complaint  Patient presents with  . Weight Check    . This visit was completed via WebEx due to the restrictions of the COVID-19 pandemic. All issues as above were discussed and addressed. Physical exam was done as above through visual confirmation on WebEx. If it was felt that the patient should be evaluated in the office, they were directed there. The patient verbally consented to this visit. . Location of the patient: home . Location of the provider: work . Those involved with this call:  . Provider: Merrie Roof, PA-C . CMA: Lesle Chris, [place] . Front Desk/Registration: Jill Side  . Time spent on call: 25 minutes with patient face to face via video conference. More than 50% of this time was spent in counseling and coordination of care. 5 minutes total spent in review of patient's record and preparation of their chart. I verified patient identity using two factors (patient name and date of birth). Patient consents verbally to being seen via telemedicine visit today.   Patient presenting today for numerous issues to follow up on.   Obesity - Does feel the saxenda is suppressing appetite. Thinks it's helping and trying to be more active. Is down 2-3 lb since last check. Recently started back to work so has been busier than she used to be.   BPs have been up and down, typically 160s/70s-80s despite increase in HCTZ during last visit. Still having some edema in lower legs but not as bad. Not wearing compression hose daily. Denies CP, SOB, HAs, dizziness. Tolerating medication well.   Relevant past medical, surgical, family and social history reviewed and updated as indicated. Interim medical history since our last visit reviewed. Allergies and  medications reviewed and updated.  Review of Systems  Per HPI unless specifically indicated above     Objective:    BP (!) 187/70   Ht 5\' 6"  (1.676 m)   Wt (!) 305 lb (138.3 kg)   BMI 49.23 kg/m   Wt Readings from Last 3 Encounters:  01/19/20 (!) 305 lb (138.3 kg)  01/08/20 (!) 307 lb (139.3 kg)  01/02/20 (!) 307 lb (139.3 kg)    Physical Exam Vitals and nursing note reviewed.  Constitutional:      General: She is not in acute distress.    Appearance: Normal appearance.  HENT:     Head: Atraumatic.     Right Ear: External ear normal.     Left Ear: External ear normal.     Nose: Nose normal. No congestion.     Mouth/Throat:     Mouth: Mucous membranes are moist.     Pharynx: Oropharynx is clear. No posterior oropharyngeal erythema.  Eyes:     Extraocular Movements: Extraocular movements intact.     Conjunctiva/sclera: Conjunctivae normal.  Cardiovascular:     Comments: Unable to assess via virtual visit Pulmonary:     Effort: Pulmonary effort is normal. No respiratory distress.  Musculoskeletal:        General: Swelling (stable b/l LE edema diffusely) present. Normal range of motion.     Cervical back: Normal range of motion.  Skin:    General: Skin is  dry.     Findings: No erythema.  Neurological:     Mental Status: She is alert and oriented to person, place, and time.  Psychiatric:        Mood and Affect: Mood normal.        Thought Content: Thought content normal.        Judgment: Judgment normal.     Results for orders placed or performed during the hospital encounter of 01/08/20  SARS CORONAVIRUS 2 (TAT 6-24 HRS) Nasopharyngeal Nasopharyngeal Swab   Specimen: Nasopharyngeal Swab  Result Value Ref Range   SARS Coronavirus 2 NEGATIVE NEGATIVE      Assessment & Plan:   Problem List Items Addressed This Visit      Cardiovascular and Mediastinum   Hypertension    Will add lisinopril and monitor home readings closely. F/u for recheck and labs in 1  month, call with side effects or       Relevant Medications   lisinopril-hydrochlorothiazide (ZESTORETIC) 20-25 MG tablet     Other   Bilateral leg edema    Continue HCTZ, efforts toward weight loss, start compression stockings daily.       Morbid obesity with BMI of 50.0-59.9, adult (HCC) - Primary    Some progress with saxenda, continue use plus increased efforts toward exercise and dietary modifications          Follow up plan: Return in about 4 weeks (around 02/16/2020) for BP.

## 2020-01-19 NOTE — Assessment & Plan Note (Signed)
Some progress with saxenda, continue use plus increased efforts toward exercise and dietary modifications

## 2020-01-19 NOTE — Assessment & Plan Note (Signed)
Continue HCTZ, efforts toward weight loss, start compression stockings daily.

## 2020-01-19 NOTE — Assessment & Plan Note (Signed)
Will add lisinopril and monitor home readings closely. F/u for recheck and labs in 1 month, call with side effects or

## 2020-01-21 NOTE — Progress Notes (Signed)
Lvm to make 1 month f/u

## 2020-02-02 ENCOUNTER — Other Ambulatory Visit: Payer: Self-pay | Admitting: Family Medicine

## 2020-02-14 ENCOUNTER — Encounter: Payer: Self-pay | Admitting: Emergency Medicine

## 2020-02-14 ENCOUNTER — Emergency Department
Admission: EM | Admit: 2020-02-14 | Discharge: 2020-02-14 | Disposition: A | Discharge status: Home / Self Care | Payer: Managed Care, Other (non HMO) | Attending: Student | Admitting: Student

## 2020-02-14 ENCOUNTER — Other Ambulatory Visit: Payer: Self-pay

## 2020-02-14 DIAGNOSIS — I10 Essential (primary) hypertension: Secondary | ICD-10-CM | POA: Insufficient documentation

## 2020-02-14 DIAGNOSIS — Z79899 Other long term (current) drug therapy: Secondary | ICD-10-CM | POA: Insufficient documentation

## 2020-02-14 DIAGNOSIS — Z20822 Contact with and (suspected) exposure to covid-19: Secondary | ICD-10-CM | POA: Diagnosis not present

## 2020-02-14 DIAGNOSIS — J029 Acute pharyngitis, unspecified: Secondary | ICD-10-CM

## 2020-02-14 DIAGNOSIS — R070 Pain in throat: Secondary | ICD-10-CM | POA: Diagnosis present

## 2020-02-14 DIAGNOSIS — R519 Headache, unspecified: Secondary | ICD-10-CM | POA: Diagnosis not present

## 2020-02-14 MED ORDER — AZITHROMYCIN 250 MG PO TABS: ORAL_TABLET | ORAL | 0 refills | Status: DC

## 2020-02-14 NOTE — ED Triage Notes (Signed)
Presents with frontal headache and sore throat  States sx's started couple of days ago   Denies any fever or cough

## 2020-02-14 NOTE — ED Provider Notes (Signed)
[facility] Emergency Department Provider Note  ____________________________________________   First MD Initiated Contact with Patient 02/14/20 1752     (approximate)  I have reviewed the triage vital signs and the nursing notes.   HISTORY  Chief Complaint Headache and Sore Throat    HPI Regina Chandler is a 29 y.o. female presents emergency department complaining of sore throat.  And headache.  Patient denies any fever or chills.  She states that all of her children have strep throat and ear infections.  She denies any loss of sense of taste or smell.  No cough or congestion.    Past Medical History:  Diagnosis Date  . Anemia   . Anxiety   . Bipolar 1 disorder (HCC)   . Depression   . GERD (gastroesophageal reflux disease)    NO MEDS  . Headache   . Hepatitis B affecting pregnancy 2007   No current treatment required, titers show up negative  . Hypertension     Patient Active Problem List   Diagnosis Date Noted  . Morbid obesity with BMI of 50.0-59.9, adult (HCC) 11/23/2019  . Hypertension   . Bilateral leg edema 09/07/2018  . Obesity 09/07/2018  . Chlamydia infection affecting pregnancy 04/23/2018  . Elevated blood pressure affecting pregnancy in third trimester, antepartum 04/03/2018  . Back pain affecting pregnancy in second trimester 12/19/2017  . Chronic hepatitis B affecting antepartum care of mother [facility]) 12/19/2017  . BMI 45.0-49.9, adult (HCC) 12/19/2017  . Headache in pregnancy, antepartum, third trimester 11/24/2016    Past Surgical History:  Procedure Laterality Date  . LAPAROSCOPIC TUBAL LIGATION N/A 07/30/2018   Procedure: LAPAROSCOPIC TUBAL LIGATION;  Surgeon: Vena Austria, MD;  Location: [facility] ORS;  Service: Gynecology;  Laterality: N/A;  . NO PAST SURGERIES    . NO PAST SURGERIES    . TUBAL LIGATION  07/30/2018   [facility])    Prior to Admission medications   Medication Sig Start Date End Date  Taking? Authorizing Provider  aluminum chloride (DRYSOL) 20 % external solution Apply topically at bedtime. 08/06/19   Particia Nearing, PA-C  azithromycin (ZITHROMAX Z-PAK) 250 MG tablet 2 pills today then 1 pill a day for 4 days 02/14/20   Sherrie Mustache Roselyn Bering, PA-C  Insulin Pen Needle (NOVOFINE) 32G X 6 MM MISC 1 application by Does not apply route daily. 12/21/19   Nadara Mustard, MD  ketoconazole (NIZORAL) 2 % cream APPLY TO AFFECTED AREA TWICE A DAY 12/17/19   Particia Nearing, PA-C  Liraglutide -Weight Management (SAXENDA) 18 MG/3ML SOPN Inject 3 mg into the skin daily. 12/21/19   Nadara Mustard, MD  lisinopril-hydrochlorothiazide (ZESTORETIC) 20-25 MG tablet Take 1 tablet by mouth daily. 01/19/20   Particia Nearing, PA-C  omeprazole (PRILOSEC) 40 MG capsule Take 1 capsule (40 mg total) by mouth daily. 12/17/19   Particia Nearing, PA-C  SPRINTEC 28 0.25-35 MG-MCG tablet TAKE 1 TABLET BY MOUTH EVERY DAY 10/24/19   Particia Nearing, PA-C    Allergies Penicillins  Family History  Problem Relation Age of Onset  . Hypertension Sister   . Hypertension Brother   . Hypertension Maternal Grandmother   . Diabetes Maternal Grandmother   . Hypertension Maternal Grandfather   . Hypertension Paternal Grandmother   . Hypertension Paternal Grandfather   . Cancer Neg Hx   . Heart disease Neg Hx   . Stroke Neg Hx     Social History Social History  Tobacco Use  . Smoking status: Never Smoker  . Smokeless tobacco: Never Used  Substance Use Topics  . Alcohol use: Yes    Comment: RARE  . Drug use: No    Review of Systems  Constitutional: No fever/chills Eyes: No visual changes. ENT: Positive sore throat. Respiratory: Denies cough Cardiovascular: Denies chest pain Gastrointestinal: Denies abdominal pain Genitourinary: Negative for dysuria. Musculoskeletal: Negative for back pain. Skin: Negative for rash. Psychiatric: no mood changes,      ____________________________________________   PHYSICAL EXAM:  VITAL SIGNS: ED Triage Vitals  Enc Vitals Group     BP 02/14/20 1743 (!) 140/91     Pulse Rate 02/14/20 1743 76     Resp 02/14/20 1743 18     Temp 02/14/20 1743 98.3 F (36.8 C)     Temp Source 02/14/20 1743 Oral     SpO2 02/14/20 1743 98 %     Weight 02/14/20 1744 296 lb (134.3 kg)     Height 02/14/20 1744 5\' 5"  (1.651 m)     Head Circumference --      Peak Flow --      Pain Score 02/14/20 1742 8     Pain Loc --      Pain Edu? --      Excl. in [place]? --     Constitutional: Alert and oriented. Well appearing and in no acute distress. Eyes: Conjunctivae are normal.  Head: Atraumatic. Nose: No congestion/rhinnorhea. Mouth/Throat: Mucous membranes are moist.  Throat is mildly red no exudates noted Neck:  supple no lymphadenopathy noted Cardiovascular: Normal rate, regular rhythm. Heart sounds are normal Respiratory: Normal respiratory effort.  No retractions, lungs c t a  GU: deferred Musculoskeletal: FROM all extremities, warm and well perfused Neurologic:  Normal speech and language.  Skin:  Skin is warm, dry and intact. No rash noted. Psychiatric: Mood and affect are normal. Speech and behavior are normal.  ____________________________________________   LABS (all labs ordered are listed, but only abnormal results are displayed)  Labs Reviewed  SARS CORONAVIRUS 2 (TAT 6-24 HRS)   ____________________________________________   ____________________________________________  RADIOLOGY    ____________________________________________   PROCEDURES  Procedure(s) performed: No  Procedures    ____________________________________________   INITIAL IMPRESSION / ASSESSMENT AND PLAN / ED COURSE  Pertinent labs & imaging results that were available during my care of the patient were reviewed by me and considered in my medical decision making (see chart for details).   Patient is 29 year old  female presents emergency department complaint of sore throat.  Physical exam shows patient to appear well.  Vitals are normal.  Throat is minimally injected.  Explained findings to the patient.  Due to her children all have been strep throat and did treat her with a Z-Pak.  However we did do a Covid swab as her children were not tested for Covid.  She is to remain out of work until she receives her Covid test results.  If negative she may return to work the next day.  If positive she should quarantine for 10 days.  Also if she begins to run fever she should remain out of work until she has been fever free for 24 to 48 hours.  She states she understands will comply.  She is discharged in stable condition.    Regina Chandler was evaluated in Emergency Department on 02/14/2020 for the symptoms described in the history of present illness. She was evaluated in the context of the global COVID-19 pandemic, which  necessitated consideration that the patient might be at risk for infection with the SARS-CoV-2 virus that causes COVID-19. Institutional protocols and algorithms that pertain to the evaluation of patients at risk for COVID-19 are in a state of rapid change based on information released by regulatory bodies including the CDC and federal and state organizations. These policies and algorithms were followed during the patient's care in the ED.   As part of my medical decision making, I reviewed the following data within the electronic MEDICAL RECORD NUMBER Nursing notes reviewed and incorporated, Old chart reviewed, Notes from prior ED visits and [place] Controlled Substance Database  ____________________________________________   FINAL CLINICAL IMPRESSION(S) / ED DIAGNOSES  Final diagnoses:  Acute pharyngitis, unspecified etiology  Suspected COVID-19 virus infection      NEW MEDICATIONS STARTED DURING THIS VISIT:  New Prescriptions   AZITHROMYCIN (ZITHROMAX Z-PAK) 250 MG TABLET    2 pills today then  1 pill a day for 4 days     Note:  This document was prepared using Dragon voice recognition software and may include unintentional dictation errors.    Faythe Ghee, PA-C 02/14/20 1809    Miguel Aschoff., MD 02/14/20 [phone]

## 2020-02-14 NOTE — Discharge Instructions (Signed)
Follow-up with your regular doctor if not improving in 3 days. Your Covid test will result in 6 to 24 hours.  If negative you may return to normal activities.  If positive quarantine for 10 days. Return to the emergency department if worsening The Z-Pak is prescribed as 2 pills today and 1 pill a day for the next 4 days.  Gargle with warm salt water.

## 2020-02-15 LAB — SARS CORONAVIRUS 2 (TAT 6-24 HRS): SARS Coronavirus 2: NEGATIVE

## 2020-02-21 ENCOUNTER — Telehealth: Payer: Managed Care, Other (non HMO) | Admitting: Family Medicine

## 2020-02-21 ENCOUNTER — Ambulatory Visit: Payer: Managed Care, Other (non HMO) | Admitting: Family Medicine

## 2020-02-22 ENCOUNTER — Telehealth (INDEPENDENT_AMBULATORY_CARE_PROVIDER_SITE_OTHER): Payer: Managed Care, Other (non HMO) | Admitting: Family Medicine

## 2020-02-22 ENCOUNTER — Encounter: Payer: Self-pay | Admitting: Family Medicine

## 2020-02-22 VITALS — BP 124/84

## 2020-02-22 DIAGNOSIS — I10 Essential (primary) hypertension: Secondary | ICD-10-CM | POA: Diagnosis not present

## 2020-02-22 MED ORDER — LISINOPRIL 20 MG PO TABS: 20.0000 mg | ORAL_TABLET | Freq: Every day | ORAL | 0 refills | Status: DC

## 2020-02-22 NOTE — Progress Notes (Signed)
BP 124/84   LMP 02/13/2020 (Exact Date)    Subjective:    Patient ID: Regina Chandler, female    DOB: [birth year], 29 y.o.   MRN: [id number]  HPI: Regina Chandler is a 29 y.o. female  Chief Complaint  Patient presents with  . Hypertension    1 month f/up     . This visit was completed via MyChart due to the restrictions of the COVID-19 pandemic. All issues as above were discussed and addressed. Physical exam was done as above through visual confirmation on MyChart. If it was felt that the patient should be evaluated in the office, they were directed there. The patient verbally consented to this visit. . Location of the patient: home . Location of the provider: home . Those involved with this call:  . Provider: Roosvelt Maser, PA-C . CMA: Elton Sin, CMA . Front Desk/Registration: Harriet Pho  . Time spent on call: 15 minutes with patient face to face via video conference. More than 50% of this time was spent in counseling and coordination of care. 5 minutes total spent in review of patient's record and preparation of their chart.  I verified patient identity using two factors (patient name and date of birth). Patient consents verbally to being seen via telemedicine visit today.   Here today for 1 month HTN f/u. Average home blood pressures running 140s-150s/80s-90s. Tolerating medication well without side effects. Swelling some better with the HCTZ. Denies CP, SOB, HAs, dizziness. Trying to eat healthy, staying active as able.   Relevant past medical, surgical, family and social history reviewed and updated as indicated. Interim medical history since our last visit reviewed. Allergies and medications reviewed and updated.  Review of Systems  Per HPI unless specifically indicated above     Objective:    BP 124/84   LMP 02/13/2020 (Exact Date)   Wt Readings from Last 3 Encounters:  02/14/20 296 lb (134.3 kg)  01/19/20 (!) 305 lb (138.3 kg)  01/08/20 (!) 307 lb (139.3  kg)    Physical Exam Vitals and nursing note reviewed.  Constitutional:      General: She is not in acute distress.    Appearance: Normal appearance.  HENT:     Head: Atraumatic.     Right Ear: External ear normal.     Left Ear: External ear normal.     Nose: Nose normal. No congestion.     Mouth/Throat:     Mouth: Mucous membranes are moist.     Pharynx: Oropharynx is clear. No posterior oropharyngeal erythema.  Eyes:     Extraocular Movements: Extraocular movements intact.     Conjunctiva/sclera: Conjunctivae normal.  Cardiovascular:     Comments: Unable to assess via virtual visit Pulmonary:     Effort: Pulmonary effort is normal. No respiratory distress.  Musculoskeletal:        General: Normal range of motion.     Cervical back: Normal range of motion.  Skin:    General: Skin is dry.     Findings: No erythema.  Neurological:     Mental Status: She is alert and oriented to person, place, and time.  Psychiatric:        Mood and Affect: Mood normal.        Thought Content: Thought content normal.        Judgment: Judgment normal.     Results for orders placed or performed during the hospital encounter of 02/14/20  SARS CORONAVIRUS 2 (TAT 6-24 HRS)  Nasopharyngeal Nasopharyngeal Swab   Specimen: Nasopharyngeal Swab  Result Value Ref Range   SARS Coronavirus 2 NEGATIVE NEGATIVE      Assessment & Plan:   Problem List Items Addressed This Visit      Cardiovascular and Mediastinum   Hypertension - Primary    Add 20 mg lisinopril to lisinopril HCTZ combo tab to improve BP control. Continue working on weight loss through diet and exercise, saxenda.       Relevant Medications   lisinopril (ZESTRIL) 20 MG tablet       Follow up plan: Return in about 4 weeks (around 03/21/2020) for HTN f/u.

## 2020-02-23 NOTE — Assessment & Plan Note (Signed)
Add 20 mg lisinopril to lisinopril HCTZ combo tab to improve BP control. Continue working on weight loss through diet and exercise, saxenda.

## 2020-03-06 ENCOUNTER — Other Ambulatory Visit: Payer: Self-pay

## 2020-03-06 ENCOUNTER — Emergency Department
Admission: EM | Admit: 2020-03-06 | Discharge: 2020-03-06 | Disposition: A | Discharge status: Home / Self Care | Payer: Medicaid Other | Attending: Emergency Medicine | Admitting: Emergency Medicine

## 2020-03-06 DIAGNOSIS — I10 Essential (primary) hypertension: Secondary | ICD-10-CM | POA: Insufficient documentation

## 2020-03-06 DIAGNOSIS — J069 Acute upper respiratory infection, unspecified: Secondary | ICD-10-CM | POA: Insufficient documentation

## 2020-03-06 DIAGNOSIS — Z20822 Contact with and (suspected) exposure to covid-19: Secondary | ICD-10-CM | POA: Diagnosis not present

## 2020-03-06 DIAGNOSIS — Z79899 Other long term (current) drug therapy: Secondary | ICD-10-CM | POA: Diagnosis not present

## 2020-03-06 DIAGNOSIS — M7918 Myalgia, other site: Secondary | ICD-10-CM | POA: Insufficient documentation

## 2020-03-06 DIAGNOSIS — R0981 Nasal congestion: Secondary | ICD-10-CM | POA: Diagnosis not present

## 2020-03-06 DIAGNOSIS — R6883 Chills (without fever): Secondary | ICD-10-CM | POA: Diagnosis present

## 2020-03-06 DIAGNOSIS — R05 Cough: Secondary | ICD-10-CM | POA: Insufficient documentation

## 2020-03-06 DIAGNOSIS — R197 Diarrhea, unspecified: Secondary | ICD-10-CM | POA: Insufficient documentation

## 2020-03-06 LAB — SARS CORONAVIRUS 2 (TAT 6-24 HRS): SARS Coronavirus 2: NEGATIVE

## 2020-03-06 LAB — GROUP A STREP BY PCR: Group A Strep by PCR: NOT DETECTED

## 2020-03-06 MED ORDER — BENZONATATE 100 MG PO CAPS: 100.0000 mg | ORAL_CAPSULE | Freq: Three times a day (TID) | ORAL | 0 refills | Status: DC | PRN

## 2020-03-06 MED ORDER — FLUTICASONE PROPIONATE 50 MCG/ACT NA SUSP: 2.0000 | Freq: Every day | NASAL | 0 refills | Status: DC

## 2020-03-06 MED ORDER — ACETAMINOPHEN 500 MG PO TABS: 500.0000 mg | ORAL_TABLET | Freq: Four times a day (QID) | ORAL | 0 refills | Status: AC | PRN

## 2020-03-06 NOTE — ED Provider Notes (Signed)
[facility] Emergency Department Provider Note  ____________________________________________  Time seen: Approximately 11:40 AM  I have reviewed the triage vital signs and the nursing notes.   HISTORY  Chief Complaint URI    HPI Regina Chandler is a 29 y.o. female that presents to the emergency department for evaluation of chills, body aches, nasal congestion, rhinorrhea, sore throat, nonproductive cough, diarrhea for 2 days.  She has had 3 episodes of diarrhea today.  Patient states that all her children have been ill the last couple of weeks with strep throat, ear infections, pneumonia.  Patient has not checked her temperature but has felt warm and cold.  Patient has not yet taken her blood pressure medication today. No shortness of breath, chest pain, vomiting, abdominal pain.  Past Medical History:  Diagnosis Date  . Anemia   . Anxiety   . Bipolar 1 disorder ([place])   . Depression   . GERD (gastroesophageal reflux disease)    NO MEDS  . Headache   . Hepatitis B affecting pregnancy 2007   No current treatment required, titers show up negative  . Hypertension     Patient Active Problem List   Diagnosis Date Noted  . Morbid obesity with BMI of 50.0-59.9, adult ([place]) 11/23/2019  . Hypertension   . Bilateral leg edema 09/07/2018  . Obesity 09/07/2018  . Chlamydia infection affecting pregnancy 04/23/2018  . Elevated blood pressure affecting pregnancy in third trimester, antepartum 04/03/2018  . Back pain affecting pregnancy in second trimester 12/19/2017  . Chronic hepatitis B affecting antepartum care of mother [facility]) 12/19/2017  . BMI 45.0-49.9, adult ([place]) 12/19/2017  . Headache in pregnancy, antepartum, third trimester 11/24/2016    Past Surgical History:  Procedure Laterality Date  . LAPAROSCOPIC TUBAL LIGATION N/A 07/30/2018   Procedure: LAPAROSCOPIC TUBAL LIGATION;  Surgeon: Malachy Mood, MD;  Location: [facility] ORS;  Service: Gynecology;   Laterality: N/A;  . NO PAST SURGERIES    . NO PAST SURGERIES    . TUBAL LIGATION  07/30/2018   [facility])    Prior to Admission medications   Medication Sig Start Date End Date Taking? Authorizing Provider  acetaminophen (TYLENOL) 500 MG tablet Take 1 tablet (500 mg total) by mouth every 6 (six) hours as needed. 03/06/20   Laban Emperor, PA-C  aluminum chloride (DRYSOL) 20 % external solution Apply topically at bedtime. 08/06/19   Volney American, PA-C  azithromycin (ZITHROMAX Z-PAK) 250 MG tablet 2 pills today then 1 pill a day for 4 days 02/14/20   Caryn Section Linden Dolin, PA-C  benzonatate (TESSALON PERLES) 100 MG capsule Take 1 capsule (100 mg total) by mouth 3 (three) times daily as needed. 03/06/20 03/06/21  Laban Emperor, PA-C  fluticasone (FLONASE) 50 MCG/ACT nasal spray Place 2 sprays into both nostrils daily. 03/06/20 03/06/21  Laban Emperor, PA-C  Insulin Pen Needle (NOVOFINE) 32G X 6 MM MISC 1 application by Does not apply route daily. 12/21/19   Gae Dry, MD  ketoconazole (NIZORAL) 2 % cream APPLY TO AFFECTED AREA TWICE A DAY 12/17/19   Volney American, PA-C  Liraglutide -Weight Management (SAXENDA) 18 MG/3ML SOPN Inject 3 mg into the skin daily. 12/21/19   Gae Dry, MD  lisinopril (ZESTRIL) 20 MG tablet Take 1 tablet (20 mg total) by mouth daily. 02/22/20   Volney American, PA-C  lisinopril-hydrochlorothiazide (ZESTORETIC) 20-25 MG tablet Take 1 tablet by mouth daily. 01/19/20   Volney American, PA-C  omeprazole (PRILOSEC) 40 MG  capsule Take 1 capsule (40 mg total) by mouth daily. 12/17/19   Particia Nearing, PA-C  SPRINTEC 28 0.25-35 MG-MCG tablet TAKE 1 TABLET BY MOUTH EVERY DAY 10/24/19   Particia Nearing, PA-C    Allergies Penicillins  Family History  Problem Relation Age of Onset  . Hypertension Sister   . Hypertension Brother   . Hypertension Maternal Grandmother   . Diabetes Maternal Grandmother   . Hypertension Maternal  Grandfather   . Hypertension Paternal Grandmother   . Hypertension Paternal Grandfather   . Cancer Neg Hx   . Heart disease Neg Hx   . Stroke Neg Hx     Social History Social History   Tobacco Use  . Smoking status: Never Smoker  . Smokeless tobacco: Never Used  Substance Use Topics  . Alcohol use: Yes    Comment: RARE  . Drug use: No     Review of Systems  Constitutional: Positive for chills Eyes: No visual changes. No discharge. ENT: Positive for congestion and rhinorrhea. Cardiovascular: No chest pain. Respiratory: Positive for occasional dry cough. No SOB. Gastrointestinal: No abdominal pain.  No nausea, no vomiting.  Positive for diarrhea.  No constipation. Musculoskeletal: Negative for musculoskeletal pain. Skin: Negative for rash, abrasions, lacerations, ecchymosis. Neurological: Negative for headaches.   ____________________________________________   PHYSICAL EXAM:  VITAL SIGNS: ED Triage Vitals [03/06/20 1022]  Enc Vitals Group     BP (!) 142/94     Pulse Rate 68     Resp 18     Temp 97.9 F (36.6 C)     Temp Source Oral     SpO2 97 %     Weight (!) 315 lb (142.9 kg)     Height 5\' 5"  (1.651 m)     Head Circumference      Peak Flow      Pain Score      Pain Loc      Pain Edu?      Excl. in [place]?      Constitutional: Alert and oriented. Well appearing and in no acute distress. Eyes: Conjunctivae are normal. PERRL. EOMI. No discharge. Head: Atraumatic. ENT: No frontal and maxillary sinus tenderness.      Ears: Tympanic membranes pearly gray with good landmarks. No discharge.      Nose: Mild congestion/rhinnorhea.      Mouth/Throat: Mucous membranes are moist. Oropharynx erythematous. Tonsils not enlarged. No exudates. Uvula midline. Neck: No stridor.   Hematological/Lymphatic/Immunilogical: No cervical lymphadenopathy. Cardiovascular: Normal rate, regular rhythm.  Good peripheral circulation. Respiratory: Normal respiratory effort without  tachypnea or retractions. Lungs CTAB. Good air entry to the bases with no decreased or absent breath sounds. Gastrointestinal: Bowel sounds 4 quadrants. Soft and nontender to palpation. No guarding or rigidity. No palpable masses. No distention. Musculoskeletal: Full range of motion to all extremities. No gross deformities appreciated. Neurologic:  Normal speech and language. No gross focal neurologic deficits are appreciated.  Skin:  Skin is warm, dry and intact. No rash noted. Psychiatric: Mood and affect are normal. Speech and behavior are normal. Patient exhibits appropriate insight and judgement.   ____________________________________________   LABS (all labs ordered are listed, but only abnormal results are displayed)  Labs Reviewed  GROUP A STREP BY PCR  SARS CORONAVIRUS 2 (TAT 6-24 HRS)   ____________________________________________  EKG   ____________________________________________  RADIOLOGY  No results found.  ____________________________________________    PROCEDURES  Procedure(s) performed:    Procedures    Medications - No  data to display   ____________________________________________   INITIAL IMPRESSION / ASSESSMENT AND PLAN / ED COURSE  Pertinent labs & imaging results that were available during my care of the patient were reviewed by me and considered in my medical decision making (see chart for details).  Review of the [place] CSRS was performed in accordance of the [facility] prior to dispensing any controlled drugs.   Patient's diagnosis is consistent with viral URI. Vital signs and exam are reassuring. Patient appears well and is staying well hydrated. Patient should alternate tylenol and ibuprofen for fever. Patient feels comfortable going home. Patient will be discharged home with prescriptions for tylenol, flonase, tessalon perles. Patient is to follow up with PCP as needed or otherwise directed. Patient is given ED precautions to return to the  ED for any worsening or new symptoms.  DELESIA MARTINEK was evaluated in Emergency Department on 03/06/2020 for the symptoms described in the history of present illness. She was evaluated in the context of the global COVID-19 pandemic, which necessitated consideration that the patient might be at risk for infection with the SARS-CoV-2 virus that causes COVID-19. Institutional protocols and algorithms that pertain to the evaluation of patients at risk for COVID-19 are in a state of rapid change based on information released by regulatory bodies including the CDC and federal and state organizations. These policies and algorithms were followed during the patient's care in the ED.   ____________________________________________  FINAL CLINICAL IMPRESSION(S) / ED DIAGNOSES  Final diagnoses:  Upper respiratory tract infection, unspecified type      NEW MEDICATIONS STARTED DURING THIS VISIT:  ED Discharge Orders         Ordered    fluticasone (FLONASE) 50 MCG/ACT nasal spray  Daily     03/06/20 1220    acetaminophen (TYLENOL) 500 MG tablet  Every 6 hours PRN     03/06/20 1220    benzonatate (TESSALON PERLES) 100 MG capsule  3 times daily PRN     03/06/20 1220              This chart was dictated using voice recognition software/Dragon. Despite best efforts to proofread, errors can occur which can change the meaning. Any change was purely unintentional.    Enid Derry, PA-C 03/06/20 1507    Minna Antis, MD 03/06/20 1515

## 2020-03-06 NOTE — ED Triage Notes (Signed)
Pt c/o cough with congestion, runny nose and diarrhea for the past 2 days. Pt is in NAD.Marland Kitchen

## 2020-03-06 NOTE — ED Notes (Signed)
See triage note  Presents with body aches congestion and headache  States sx's started this weekend  Denies any fever and is afebrile on arrival

## 2020-03-20 ENCOUNTER — Ambulatory Visit: Payer: Managed Care, Other (non HMO) | Admitting: Obstetrics & Gynecology

## 2020-03-21 ENCOUNTER — Other Ambulatory Visit: Payer: Self-pay | Admitting: Family Medicine

## 2020-03-27 ENCOUNTER — Ambulatory Visit: Payer: Medicaid Other | Admitting: Family Medicine

## 2020-04-04 ENCOUNTER — Other Ambulatory Visit: Payer: Self-pay | Admitting: Family Medicine

## 2020-04-04 NOTE — Telephone Encounter (Signed)
Requested Prescriptions  Pending Prescriptions Disp Refills  . lisinopril (ZESTRIL) 20 MG tablet [Pharmacy Med Name: LISINOPRIL 20 MG TABLET] 60 tablet 0    Sig: TAKE 1 TABLET BY MOUTH EVERY DAY     Cardiovascular:  ACE Inhibitors Failed - 04/04/2020  8:59 AM      Failed - Cr in normal range and within 180 days    Creatinine  Date Value Ref Range Status  06/27/2012 0.72 0.60 - 1.30 mg/dL Final   Creatinine, Ser  Date Value Ref Range Status  02/23/2019 0.76 0.57 - 1.00 mg/dL Final   Creatinine, Urine  Date Value Ref Range Status  04/03/2018 112 mg/dL Final         Failed - K in normal range and within 180 days    Potassium  Date Value Ref Range Status  02/23/2019 4.1 3.5 - 5.2 mmol/L Final  06/27/2012 3.9 3.5 - 5.1 mmol/L Final         Failed - Last BP in normal range    BP Readings from Last 1 Encounters:  03/06/20 138/90         Passed - Patient is not pregnant      Passed - Valid encounter within last 6 months    Recent Outpatient Visits          1 month ago Essential hypertension   [facility] Particia Nearing, PA-C   2 months ago Morbid obesity with BMI of 50.0-59.9, adult [facility])   [facility] [place], [place], [place]   3 months ago Essential hypertension   [facility] Particia Nearing, [place]   4 months ago Class 3 severe obesity due to excess calories without serious comorbidity with body mass index (BMI) of 45.0 to 49.9 in adult [facility])   [facility] Particia Nearing, [place]   7 months ago Acute nonintractable headache, unspecified headache type   [facility], [place], [place]

## 2020-04-18 ENCOUNTER — Other Ambulatory Visit: Payer: Self-pay | Admitting: Family Medicine

## 2020-04-18 NOTE — Telephone Encounter (Signed)
Tried to call patient, no answer and unable to leave a message, patient needs a BP follow up scheduled.

## 2020-04-18 NOTE — Telephone Encounter (Signed)
Requested medication (s) are due for refill today:   Yes  Requested medication (s) are on the active medication list:   Yes  Future visit scheduled:   No  She was for a f/u since Lisinopril was added.   She was a No Show on 03/27/2020   Last ordered: 04/04/2020  #60 0 refills.   Clinic note:  She was for a f/u on 03/27/2020 which she did not show up for.    Returned for provider to review for refills.     Requested Prescriptions  Pending Prescriptions Disp Refills   lisinopril (ZESTRIL) 20 MG tablet [Pharmacy Med Name: LISINOPRIL 20 MG TABLET] 90 tablet 1    Sig: TAKE 1 TABLET BY MOUTH EVERY DAY      Cardiovascular:  ACE Inhibitors Failed - 04/18/2020 10:26 AM      Failed - Cr in normal range and within 180 days    Creatinine  Date Value Ref Range Status  06/27/2012 0.72 0.60 - 1.30 mg/dL Final   Creatinine, Ser  Date Value Ref Range Status  02/23/2019 0.76 0.57 - 1.00 mg/dL Final   Creatinine, Urine  Date Value Ref Range Status  04/03/2018 112 mg/dL Final          Failed - K in normal range and within 180 days    Potassium  Date Value Ref Range Status  02/23/2019 4.1 3.5 - 5.2 mmol/L Final  06/27/2012 3.9 3.5 - 5.1 mmol/L Final          Failed - Last BP in normal range    BP Readings from Last 1 Encounters:  03/06/20 138/90          Passed - Patient is not pregnant      Passed - Valid encounter within last 6 months    Recent Outpatient Visits           1 month ago Essential hypertension   [facility] Particia Nearing, PA-C   3 months ago Morbid obesity with BMI of 50.0-59.9, adult [facility])   [facility] [place], [place], [place]   4 months ago Essential hypertension   [facility] Particia Nearing, [place]   5 months ago Class 3 severe obesity due to excess calories without serious comorbidity with body mass index (BMI) of 45.0 to 49.9 in adult [facility])   [facility] Particia Nearing, [place]   7  months ago Acute nonintractable headache, unspecified headache type   [facility], [place], [place]

## 2020-04-20 NOTE — Telephone Encounter (Signed)
Called and left a message letting patient know that she needs a BP follow up scheduled.

## 2020-04-21 NOTE — Telephone Encounter (Signed)
Scheduled appointment for 04/26/20

## 2020-04-26 ENCOUNTER — Ambulatory Visit: Payer: Medicaid Other | Admitting: Family Medicine

## 2020-05-02 ENCOUNTER — Telehealth (INDEPENDENT_AMBULATORY_CARE_PROVIDER_SITE_OTHER): Payer: Medicaid Other | Admitting: Family Medicine

## 2020-05-02 ENCOUNTER — Encounter: Payer: Self-pay | Admitting: Family Medicine

## 2020-05-02 VITALS — BP 100/89 | Wt 315.0 lb

## 2020-05-02 DIAGNOSIS — I10 Essential (primary) hypertension: Secondary | ICD-10-CM

## 2020-05-02 DIAGNOSIS — M79671 Pain in right foot: Secondary | ICD-10-CM | POA: Diagnosis not present

## 2020-05-02 DIAGNOSIS — M79672 Pain in left foot: Secondary | ICD-10-CM

## 2020-05-02 DIAGNOSIS — Z6841 Body Mass Index (BMI) 40.0 and over, adult: Secondary | ICD-10-CM | POA: Diagnosis not present

## 2020-05-02 MED ORDER — LISINOPRIL-HYDROCHLOROTHIAZIDE 10-12.5 MG PO TABS: 1.0000 | ORAL_TABLET | Freq: Every day | ORAL | 0 refills | Status: DC

## 2020-05-02 MED ORDER — IBUPROFEN 800 MG PO TABS: 800.0000 mg | ORAL_TABLET | Freq: Three times a day (TID) | ORAL | 0 refills | Status: DC | PRN

## 2020-05-02 NOTE — Progress Notes (Signed)
BP 100/89   Wt (!) 315 lb (142.9 kg)   BMI 52.42 kg/m    Subjective:    Patient ID: Regina Chandler, female    DOB: 30-Dec-1990, 29 y.o.   MRN: [id number]  HPI: Regina Chandler is a 29 y.o. female  Chief Complaint  Patient presents with  . Hypertension    . This visit was completed via MyChart due to the restrictions of the COVID-19 pandemic. All issues as above were discussed and addressed. Physical exam was done as above through visual confirmation on MyChart. If it was felt that the patient should be evaluated in the office, they were directed there. The patient verbally consented to this visit. . Location of the patient: home . Location of the provider: work . Those involved with this call:  . Provider: Roosvelt Maser, PA-C . CMA: Elton Sin, CMA . Front Desk/Registration: Harriet Pho  . Time spent on call: 25 minutes with patient face to face via video conference. More than 50% of this time was spent in counseling and coordination of care. 5 minutes total spent in review of patient's record and preparation of their chart. I verified patient identity using two factors (patient name and date of birth). Patient consents verbally to being seen via telemedicine visit today.  *Today's call toward end of call had to be converted to telephone call due to poor connection and difficulty understanding patient due to breaking up   Patient presenting today for HTN f/u. Some confusion on which medications she is taking, she believes she's taking the 20 mg lisinopril and a 25 mg HCTZ currently and not the combo pill. Home BPs running around 100/60 and sometimes getting lightheaded. Denies CP, SOB, HAs, syncope.   Still taking saxenda, tolerating well as far as she knows. Notes the weight is not really changing but she is losing inches and clothes are fitting better. Active at work but does not exercise in a traditional sense. Trying to eat better and smaller portions.   Taking ibuprofen  800 mg here and there for aches and pains. Typically has lots of foot pain when she's super active at work. Needing a refill on this.   Relevant past medical, surgical, family and social history reviewed and updated as indicated. Interim medical history since our last visit reviewed. Allergies and medications reviewed and updated.  Review of Systems  Per HPI unless specifically indicated above     Objective:    BP 100/89   Wt (!) 315 lb (142.9 kg)   BMI 52.42 kg/m   Wt Readings from Last 3 Encounters:  05/02/20 (!) 315 lb (142.9 kg)  03/06/20 (!) 315 lb (142.9 kg)  02/14/20 296 lb (134.3 kg)    Physical Exam Vitals and nursing note reviewed.  Constitutional:      General: She is not in acute distress.    Appearance: Normal appearance.  HENT:     Head: Atraumatic.     Right Ear: External ear normal.     Left Ear: External ear normal.     Nose: Nose normal. No congestion.     Mouth/Throat:     Mouth: Mucous membranes are moist.     Pharynx: Oropharynx is clear. No posterior oropharyngeal erythema.  Eyes:     Extraocular Movements: Extraocular movements intact.     Conjunctiva/sclera: Conjunctivae normal.  Cardiovascular:     Comments: Unable to assess via virtual visit Pulmonary:     Effort: Pulmonary effort is normal. No respiratory  distress.  Musculoskeletal:        General: Normal range of motion.     Cervical back: Normal range of motion.  Skin:    General: Skin is dry.     Findings: No erythema.  Neurological:     Mental Status: She is alert and oriented to person, place, and time.  Psychiatric:        Mood and Affect: Mood normal.        Thought Content: Thought content normal.        Judgment: Judgment normal.     Results for orders placed or performed during the hospital encounter of 03/06/20  Group A Strep by PCR ([facility] Only)   Specimen: Nasopharyngeal Swab; Sterile Swab  Result Value Ref Range   Group A Strep by PCR NOT DETECTED NOT DETECTED  SARS  CORONAVIRUS 2 (TAT 6-24 HRS) Nasopharyngeal Nasopharyngeal Swab   Specimen: Nasopharyngeal Swab  Result Value Ref Range   SARS Coronavirus 2 NEGATIVE NEGATIVE      Assessment & Plan:   Problem List Items Addressed This Visit      Cardiovascular and Mediastinum   Hypertension - Primary    Will decrease doses due to mild hypotension with orthostatic sxs. Pt to be taking the combo lisinopril HCTZ at 10/12.5 mg ONLY. Reviewed with patient      Relevant Medications   lisinopril-hydrochlorothiazide (ZESTORETIC) 10-12.5 MG tablet     Other   Obesity   Morbid obesity with BMI of 50.0-59.9, adult (HCC)    No significant weight loss noted numerically but per patient she is losing inches of body mass. Continue working on more intensive diet and exercise regimen and continue saxenda       Other Visit Diagnoses    Pain in both feet       Continue prn ibuprofen, refill given, and work on weight loss. Supportive shoes reviewed       Follow up plan: Return in about 4 weeks (around 05/30/2020) for CPE.

## 2020-05-03 ENCOUNTER — Telehealth: Payer: Self-pay | Admitting: Family Medicine

## 2020-05-03 ENCOUNTER — Encounter: Payer: Self-pay | Admitting: Family Medicine

## 2020-05-03 NOTE — Telephone Encounter (Signed)
Unable to lvm to schedule this physical apt.

## 2020-05-03 NOTE — Telephone Encounter (Signed)
-----   Message from Particia Nearing, [place] sent at 05/03/2020  5:07 AM EDT ----- 1 month CPE

## 2020-05-03 NOTE — Telephone Encounter (Signed)
Unable to lvm,sent letter.  

## 2020-05-03 NOTE — Assessment & Plan Note (Signed)
No significant weight loss noted numerically but per patient she is losing inches of body mass. Continue working on more intensive diet and exercise regimen and continue saxenda

## 2020-05-03 NOTE — Assessment & Plan Note (Signed)
Will decrease doses due to mild hypotension with orthostatic sxs. Pt to be taking the combo lisinopril HCTZ at 10/12.5 mg ONLY. Reviewed with patient

## 2020-05-16 NOTE — Telephone Encounter (Signed)
Unable to lvm to make this apt/  

## 2020-05-19 ENCOUNTER — Emergency Department
Admission: EM | Admit: 2020-05-19 | Discharge: 2020-05-19 | Disposition: A | Discharge status: Home / Self Care | Payer: Medicaid Other | Attending: Student | Admitting: Student

## 2020-05-19 ENCOUNTER — Other Ambulatory Visit: Payer: Self-pay

## 2020-05-19 DIAGNOSIS — Z79899 Other long term (current) drug therapy: Secondary | ICD-10-CM | POA: Insufficient documentation

## 2020-05-19 DIAGNOSIS — J029 Acute pharyngitis, unspecified: Secondary | ICD-10-CM | POA: Insufficient documentation

## 2020-05-19 DIAGNOSIS — I10 Essential (primary) hypertension: Secondary | ICD-10-CM | POA: Diagnosis not present

## 2020-05-19 DIAGNOSIS — Z20822 Contact with and (suspected) exposure to covid-19: Secondary | ICD-10-CM | POA: Diagnosis not present

## 2020-05-19 LAB — GROUP A STREP BY PCR: Group A Strep by PCR: NOT DETECTED

## 2020-05-19 LAB — SARS CORONAVIRUS 2 BY RT PCR (HOSPITAL ORDER, PERFORMED IN ~~LOC~~ HOSPITAL LAB): SARS Coronavirus 2: NEGATIVE

## 2020-05-19 NOTE — ED Notes (Signed)
See triage note  Presents with her children  States there was a COVID positive child at day care   States she has  Not had fever but has had sore throat

## 2020-05-19 NOTE — ED Provider Notes (Signed)
[facility] Emergency Department Provider Note ____________________________________________  Time seen: 1025  I have reviewed the triage vital signs and the nursing notes.  HISTORY  Chief Complaint  Sore Throat  HPI Regina Chandler is a 29 y.o. female presents to the ED accompanied by her 4 children, for evaluation after close Covid exposure.  Mom's primary complaint is sore throat has been persistent for the last 2 weeks patient denies any interim fever, chills, sweats patient denies a difficult swallowing, breathing, or controlling secretions.   Past Medical History:  Diagnosis Date  . Anemia   . Anxiety   . Bipolar 1 disorder ([place])   . Depression   . GERD (gastroesophageal reflux disease)    NO MEDS  . Headache   . Hepatitis B affecting pregnancy 2007   No current treatment required, titers show up negative  . Hypertension     Patient Active Problem List   Diagnosis Date Noted  . Morbid obesity with BMI of 50.0-59.9, adult ([place]) 11/23/2019  . Hypertension   . Bilateral leg edema 09/07/2018  . Obesity 09/07/2018  . Chlamydia infection affecting pregnancy 04/23/2018  . Elevated blood pressure affecting pregnancy in third trimester, antepartum 04/03/2018  . Back pain affecting pregnancy in second trimester 12/19/2017  . Chronic hepatitis B affecting antepartum care of mother [facility]) 12/19/2017  . BMI 45.0-49.9, adult ([place]) 12/19/2017  . Headache in pregnancy, antepartum, third trimester 11/24/2016    Past Surgical History:  Procedure Laterality Date  . LAPAROSCOPIC TUBAL LIGATION N/A 07/30/2018   Procedure: LAPAROSCOPIC TUBAL LIGATION;  Surgeon: Malachy Mood, MD;  Location: [facility] ORS;  Service: Gynecology;  Laterality: N/A;  . NO PAST SURGERIES    . NO PAST SURGERIES    . TUBAL LIGATION  07/30/2018   [facility])    Prior to Admission medications   Medication Sig Start Date End Date Taking? Authorizing Provider  acetaminophen  (TYLENOL) 500 MG tablet Take 1 tablet (500 mg total) by mouth every 6 (six) hours as needed. 03/06/20   Laban Emperor, PA-C  aluminum chloride (DRYSOL) 20 % external solution Apply topically at bedtime. 08/06/19   Volney American, PA-C  fluticasone [facility]) 50 MCG/ACT nasal spray Place 2 sprays into both nostrils daily. 03/06/20 03/06/21  Laban Emperor, PA-C  Insulin Pen Needle (NOVOFINE) 32G X 6 MM MISC 1 application by Does not apply route daily. 12/21/19   Gae Dry, MD  ketoconazole (NIZORAL) 2 % cream APPLY TO AFFECTED AREA TWICE A DAY 12/17/19   Volney American, PA-C  Liraglutide -Weight Management (SAXENDA) 18 MG/3ML SOPN Inject 3 mg into the skin daily. 12/21/19   Gae Dry, MD  lisinopril-hydrochlorothiazide (ZESTORETIC) 10-12.5 MG tablet Take 1 tablet by mouth daily. 05/02/20   Volney American, PA-C  omeprazole (PRILOSEC) 40 MG capsule Take 1 capsule (40 mg total) by mouth daily. 12/17/19   Volney American, PA-C  [place] 28 0.25-35 MG-MCG tablet TAKE 1 TABLET BY MOUTH EVERY DAY 10/24/19   Volney American, PA-C    Allergies Penicillins  Family History  Problem Relation Age of Onset  . Hypertension Sister   . Hypertension Brother   . Hypertension Maternal Grandmother   . Diabetes Maternal Grandmother   . Hypertension Maternal Grandfather   . Hypertension Paternal Grandmother   . Hypertension Paternal Grandfather   . Cancer Neg Hx   . Heart disease Neg Hx   . Stroke Neg Hx     Social History Social History  Tobacco Use  . Smoking status: Never Smoker  . Smokeless tobacco: Never Used  Vaping Use  . Vaping Use: Never used  Substance Use Topics  . Alcohol use: Yes    Comment: RARE  . Drug use: No    Review of Systems  Constitutional: Negative for fever. Eyes: Negative for visual changes. ENT: Positive for sore throat. Cardiovascular: Negative for chest pain. Respiratory: Negative for shortness of breath. Gastrointestinal:  Negative for abdominal pain, vomiting and diarrhea. Genitourinary: Negative for dysuria. Musculoskeletal: Negative for back pain. Skin: Negative for rash. Neurological: Negative for headaches, focal weakness or numbness. ____________________________________________  PHYSICAL EXAM:  VITAL SIGNS: ED Triage Vitals  Enc Vitals Group     BP 05/19/20 0949 (!) 168/96     Pulse Rate 05/19/20 0949 81     Resp 05/19/20 0949 18     Temp 05/19/20 0949 98.8 F (37.1 C)     Temp src --      SpO2 05/19/20 0949 96 %     Weight 05/19/20 0950 290 lb (131.5 kg)     Height 05/19/20 0950 5\' 5"  (1.651 m)     Head Circumference --      Peak Flow --      Pain Score 05/19/20 0950 4     Pain Loc --      Pain Edu? --      Excl. in GC? --     Constitutional: Alert and oriented. Well appearing and in no distress. Head: Normocephalic and atraumatic. Eyes: Conjunctivae are normal. PERRL. Normal extraocular movements Ears: Canals clear. TMs intact bilaterally. Nose: No congestion/rhinorrhea/epistaxis. Mouth/Throat: Mucous membranes are moist.  Uvula is midline and tonsils are flat.  No oropharyngeal lesions are appreciated. Neck: Supple. No thyromegaly. Hematological/Lymphatic/Immunological: No cervical lymphadenopathy. Cardiovascular: Normal rate, regular rhythm. Normal distal pulses. Respiratory: Normal respiratory effort. No wheezes/rales/rhonchi. Gastrointestinal: Soft and nontender. No distention. Musculoskeletal: Nontender with normal range of motion in all extremities.  Neurologic:  Normal gait without ataxia. Normal speech and language. No gross focal neurologic deficits are appreciated. ____________________________________________   LABS (pertinent positives/negatives) Labs Reviewed  GROUP A STREP BY PCR  SARS CORONAVIRUS 2 BY RT PCR (HOSPITAL ORDER, PERFORMED IN [facility])   ____________________________________________  PROCEDURES  Procedures ____________________________________________  INITIAL IMPRESSION / ASSESSMENT AND PLAN / ED COURSE  Patient with ED evaluation after concern for possible Covid exposure.  Patient also had a secondary complaint of intermittent sore throat pain for the last 2 weeks.  Her exam is overall benign return at this time.  Labs are negative for any acute viral infection or strep pharyngitis.  Patient is advised to take over-the-counter allergy medicines and decongestants as needed.  She will follow with primary provider return to the ED as needed.  VALINE DROZDOWSKI was evaluated in Emergency Department on 05/19/2020 for the symptoms described in the history of present illness. She was evaluated in the context of the global COVID-19 pandemic, which necessitated consideration that the patient might be at risk for infection with the SARS-CoV-2 virus that causes COVID-19. Institutional protocols and algorithms that pertain to the evaluation of patients at risk for COVID-19 are in a state of rapid change based on information released by regulatory bodies including the CDC and federal and state organizations. These policies and algorithms were followed during the patient's care in the ED. ____________________________________________  FINAL CLINICAL IMPRESSION(S) / ED DIAGNOSES  Final diagnoses:  Close exposure to COVID-19 virus  Sore throat  Lissa Hoard, PA-C 05/19/20 1810    Miguel Aschoff., MD 05/20/20 [id number]

## 2020-05-19 NOTE — Discharge Instructions (Addendum)
Your strep test and Covid tests are negative. Continue to take OTC allergy medicines and decongestants as needed. Follow-up with your provider as needed.

## 2020-05-19 NOTE — ED Triage Notes (Addendum)
Pt comes with c.o sore throat that has been going on for about week. Pt thinks she may have covid. Pt states there was outbreak at daycare kids attend.  Pt also states headache.

## 2020-06-02 ENCOUNTER — Other Ambulatory Visit: Payer: Self-pay | Admitting: Family Medicine

## 2020-06-16 ENCOUNTER — Other Ambulatory Visit: Payer: Self-pay | Admitting: Family Medicine

## 2020-06-16 NOTE — Telephone Encounter (Signed)
Requested medication (s) are due for refill today: no  Requested medication (s) are on the active medication list: yes  Last refill:  06/02/2020  Future visit scheduled: no  Notes to clinic:  REQUEST FOR 90 DAYS PRESCRIPTION  Requested Prescriptions  Pending Prescriptions Disp Refills   lisinopril-hydrochlorothiazide (ZESTORETIC) 10-12.5 MG tablet [Pharmacy Med Name: LISINOPRIL-HCTZ 10-12.5 MG TAB] 90 tablet 1    Sig: TAKE 1 TABLET BY MOUTH EVERY DAY      Cardiovascular:  ACEI + Diuretic Combos Failed - 06/16/2020  8:35 AM      Failed - Na in normal range and within 180 days    Sodium  Date Value Ref Range Status  02/23/2019 139 134 - 144 mmol/L Final  06/27/2012 141 136 - 145 mmol/L Final          Failed - K in normal range and within 180 days    Potassium  Date Value Ref Range Status  02/23/2019 4.1 3.5 - 5.2 mmol/L Final  06/27/2012 3.9 3.5 - 5.1 mmol/L Final          Failed - Cr in normal range and within 180 days    Creatinine  Date Value Ref Range Status  06/27/2012 0.72 0.60 - 1.30 mg/dL Final   Creatinine, Ser  Date Value Ref Range Status  02/23/2019 0.76 0.57 - 1.00 mg/dL Final   Creatinine, Urine  Date Value Ref Range Status  04/03/2018 112 mg/dL Final          Failed - Ca in normal range and within 180 days    Calcium  Date Value Ref Range Status  02/23/2019 9.1 8.7 - 10.2 mg/dL Final   Calcium, Total  Date Value Ref Range Status  06/27/2012 8.5 8.5 - 10.1 mg/dL Final          Failed - Last BP in normal range    BP Readings from Last 1 Encounters:  05/19/20 (!) 168/96          Passed - Patient is not pregnant      Passed - Valid encounter within last 6 months    Recent Outpatient Visits           1 month ago Essential hypertension   [facility] Particia N[place], [place]   3 months ago Essential hypertension   [facility] [place], [place], [place]   4 months ago Morbid obesity with BMI of 50.0-59.9,  adult [facility])   [facility] [place], [place] , [place]   6 months ago Essential hypertension   [facility] Particia N[place], [place]   7 months ago Class 3 severe obesity due to excess calories without serious comorbidity with body mass index (BMI) of 45.0 to 49.9 in adult [facility])   [facility] Particia Nearing, [place]

## 2020-06-16 NOTE — Telephone Encounter (Signed)
Routing to provider  

## 2020-06-22 ENCOUNTER — Other Ambulatory Visit: Payer: Self-pay

## 2020-06-22 ENCOUNTER — Emergency Department
Admission: EM | Admit: 2020-06-22 | Discharge: 2020-06-22 | Disposition: A | Discharge status: Home / Self Care | Payer: Medicaid Other | Attending: Student in an Organized Health Care Education/Training Program | Admitting: Student in an Organized Health Care Education/Training Program

## 2020-06-22 DIAGNOSIS — I10 Essential (primary) hypertension: Secondary | ICD-10-CM | POA: Insufficient documentation

## 2020-06-22 DIAGNOSIS — Z880 Allergy status to penicillin: Secondary | ICD-10-CM | POA: Diagnosis not present

## 2020-06-22 DIAGNOSIS — R070 Pain in throat: Secondary | ICD-10-CM | POA: Diagnosis present

## 2020-06-22 DIAGNOSIS — Z20822 Contact with and (suspected) exposure to covid-19: Secondary | ICD-10-CM | POA: Diagnosis not present

## 2020-06-22 DIAGNOSIS — Z79899 Other long term (current) drug therapy: Secondary | ICD-10-CM | POA: Insufficient documentation

## 2020-06-22 DIAGNOSIS — B349 Viral infection, unspecified: Secondary | ICD-10-CM | POA: Insufficient documentation

## 2020-06-22 DIAGNOSIS — J069 Acute upper respiratory infection, unspecified: Secondary | ICD-10-CM | POA: Diagnosis not present

## 2020-06-22 LAB — GROUP A STREP BY PCR: Group A Strep by PCR: NOT DETECTED

## 2020-06-22 LAB — SARS CORONAVIRUS 2 BY RT PCR (HOSPITAL ORDER, PERFORMED IN ~~LOC~~ HOSPITAL LAB): SARS Coronavirus 2: NEGATIVE

## 2020-06-22 NOTE — ED Triage Notes (Signed)
Pt c/o HA with sore throat and bodyaches that started today

## 2020-06-22 NOTE — ED Provider Notes (Signed)
[facility] Emergency Department Provider Note   ____________________________________________   First MD Initiated Contact with Patient 06/22/20 1052     (approximate)  I have reviewed the triage vital signs and the nursing notes.   HISTORY  Chief Complaint URI   HPI Regina Chandler is a 29 y.o. female presents to the ED with complaint of sore throat that started this morning. Patient is unaware of any exposure to strep throat. She denies any known fever or chills. She does report that she has had diarrhea but no vomiting. She also denies any known exposure to Covid.      Past Medical History:  Diagnosis Date  . Anemia   . Anxiety   . Bipolar 1 disorder (HCC)   . Depression   . GERD (gastroesophageal reflux disease)    NO MEDS  . Headache   . Hepatitis B affecting pregnancy 2007   No current treatment required, titers show up negative  . Hypertension     Patient Active Problem List   Diagnosis Date Noted  . Morbid obesity with BMI of 50.0-59.9, adult (HCC) 11/23/2019  . Hypertension   . Bilateral leg edema 09/07/2018  . Obesity 09/07/2018  . Chlamydia infection affecting pregnancy 04/23/2018  . Elevated blood pressure affecting pregnancy in third trimester, antepartum 04/03/2018  . Back pain affecting pregnancy in second trimester 12/19/2017  . Chronic hepatitis B affecting antepartum care of mother [facility]) 12/19/2017  . BMI 45.0-49.9, adult (HCC) 12/19/2017  . Headache in pregnancy, antepartum, third trimester 11/24/2016    Past Surgical History:  Procedure Laterality Date  . LAPAROSCOPIC TUBAL LIGATION N/A 07/30/2018   Procedure: LAPAROSCOPIC TUBAL LIGATION;  Surgeon: Vena Austria, MD;  Location: [facility] ORS;  Service: Gynecology;  Laterality: N/A;  . NO PAST SURGERIES    . NO PAST SURGERIES    . TUBAL LIGATION  07/30/2018   [facility])    Prior to Admission medications   Medication Sig Start Date End Date Taking?  Authorizing Provider  acetaminophen (TYLENOL) 500 MG tablet Take 1 tablet (500 mg total) by mouth every 6 (six) hours as needed. 03/06/20   Enid Derry, PA-C  aluminum chloride (DRYSOL) 20 % external solution Apply topically at bedtime. 08/06/19   Particia Nearing, PA-C  fluticasone [facility]) 50 MCG/ACT nasal spray Place 2 sprays into both nostrils daily. 03/06/20 03/06/21  Enid Derry, PA-C  Insulin Pen Needle (NOVOFINE) 32G X 6 MM MISC 1 application by Does not apply route daily. 12/21/19   Nadara Mustard, MD  ketoconazole (NIZORAL) 2 % cream APPLY TO AFFECTED AREA TWICE A DAY 12/17/19   Particia Nearing, PA-C  Liraglutide -Weight Management (SAXENDA) 18 MG/3ML SOPN Inject 3 mg into the skin daily. 12/21/19   Nadara Mustard, MD  lisinopril-hydrochlorothiazide (ZESTORETIC) 10-12.5 MG tablet TAKE 1 TABLET BY MOUTH EVERY DAY 06/16/20   Particia Nearing, PA-C  omeprazole (PRILOSEC) 40 MG capsule Take 1 capsule (40 mg total) by mouth daily. 12/17/19   Particia Nearing, PA-C  SPRINTEC 28 0.25-35 MG-MCG tablet TAKE 1 TABLET BY MOUTH EVERY DAY 10/24/19   Particia Nearing, PA-C    Allergies Penicillins  Family History  Problem Relation Age of Onset  . Hypertension Sister   . Hypertension Brother   . Hypertension Maternal Grandmother   . Diabetes Maternal Grandmother   . Hypertension Maternal Grandfather   . Hypertension Paternal Grandmother   . Hypertension Paternal Grandfather   . Cancer Neg Hx   .  Heart disease Neg Hx   . Stroke Neg Hx     Social History Social History   Tobacco Use  . Smoking status: Never Smoker  . Smokeless tobacco: Never Used  Vaping Use  . Vaping Use: Never used  Substance Use Topics  . Alcohol use: Not Currently    Comment: RARE  . Drug use: No    Review of Systems Constitutional: No fever/chills Eyes: No visual changes. ENT: Positive sore throat. Negative ear pain. Cardiovascular: Denies chest pain. Respiratory: Denies  shortness of breath. Negative for cough. Gastrointestinal: No abdominal pain.  No nausea, no vomiting. Positive diarrhea.  Genitourinary: Negative for dysuria. Musculoskeletal: Positive for muscle aches. Skin: Negative for rash. Neurological: Negative for headaches, focal weakness or numbness.  ____________________________________________   PHYSICAL EXAM:  VITAL SIGNS: ED Triage Vitals  Enc Vitals Group     BP 06/22/20 1018 (!) 151/87     Pulse Rate 06/22/20 1018 73     Resp 06/22/20 1018 16     Temp 06/22/20 1018 98.6 F (37 C)     Temp Source 06/22/20 1018 Oral     SpO2 06/22/20 1018 98 %     Weight 06/22/20 1019 (!) 305 lb (138.3 kg)     Height 06/22/20 1019 5\' 5"  (1.651 m)     Head Circumference --      Peak Flow --      Pain Score 06/22/20 1019 8     Pain Loc --      Pain Edu? --      Excl. in GC? --     Constitutional: Alert and oriented. Well appearing and in no acute distress. Eyes: Conjunctivae are normal. PERRL. EOMI. Head: Atraumatic. Nose: No congestion/rhinnorhea. EACs and clear, TMs are clear Mouth/Throat: Mucous membranes are moist.  Oropharynx non-erythematous. Uvula is midline and no exudate was seen. Neck: No stridor.   Hematological/Lymphatic/Immunilogical: No cervical lymphadenopathy. Cardiovascular: Normal rate, regular rhythm. Grossly normal heart sounds.  Good peripheral circulation. Respiratory: Normal respiratory effort.  No retractions. Lungs CTAB. Gastrointestinal: Soft and nontender. No distention.  Musculoskeletal: Moves upper and lower extremities that any difficulty. Normal gait was noted. Neurologic:  Normal speech and language. No gross focal neurologic deficits are appreciated. No gait instability. Skin:  Skin is warm, dry and intact. No rash noted. Psychiatric: Mood and affect are normal. Speech and behavior are normal.  ____________________________________________   LABS (all labs ordered are listed, but only abnormal results are  displayed)  Labs Reviewed  GROUP A STREP BY PCR  SARS CORONAVIRUS 2 BY RT PCR (HOSPITAL ORDER, PERFORMED IN [facility])    PROCEDURES  Procedure(s) performed (including Critical Care):  Procedures   ____________________________________________   INITIAL IMPRESSION / ASSESSMENT AND PLAN / ED COURSE  As part of my medical decision making, I reviewed the following data within the electronic MEDICAL RECORD NUMBER Notes from prior ED visits and [place] Controlled Substance Database  29 year old female presents to the ED with complaint of sore throat and body aches that started today.  Patient is unaware of any known exposure to Covid or strep throat.  She denies any fever or chills.  She does report diarrhea.  Patient also had the Pfizer Covid vaccine.  Physical exam was unremarkable.  Strep test was negative and Covid test was pending.  Patient was made aware that she would be called if her test is positive.  A work note was written with extra information added that if her Covid  test is positive she will need an additional 10 days.  ____________________________________________   FINAL CLINICAL IMPRESSION(S) / ED DIAGNOSES  Final diagnoses:  Viral syndrome     ED Discharge Orders    None       Note:  This document was prepared using Dragon voice recognition software and may include unintentional dictation errors.    Tommi Rumps, PA-C 06/22/20 1613    Willy Eddy, MD 06/23/20 [phone]

## 2020-06-22 NOTE — Discharge Instructions (Signed)
Follow-up with your primary care provider if any continued problems or concerns.  Drink lots of fluids to stay hydrated especially with having diarrhea and the heat.  Take Tylenol or ibuprofen if needed for throat pain, body aches or fever.  Your strep test is negative.  The Covid test is still pending however if it is positive you will receive a phone call today.  If your test is positive you will need to quarantine for 10 days.

## 2020-06-26 ENCOUNTER — Telehealth: Payer: Self-pay | Admitting: *Deleted

## 2020-06-26 NOTE — Telephone Encounter (Signed)
Attempted to contact pt to complete transitions of care assessment; message states voicemail is full; unable to leave message. ° °Katrice Tidus Upchurch, RN, BSN, CCRN °Patient Engagement Center °[phone] ° °

## 2020-07-27 ENCOUNTER — Other Ambulatory Visit: Payer: Self-pay | Admitting: Family Medicine

## 2020-07-28 NOTE — Telephone Encounter (Signed)
Routing to provider  

## 2020-07-28 NOTE — Telephone Encounter (Signed)
Requested medication (s) are due for refill today: Yes  Requested medication (s) are on the active medication list: No  Last refill:  05/02/20  Future visit scheduled: Yes  Notes to clinic:  Medication not on list. Possibly d/c by error.    Requested Prescriptions  Pending Prescriptions Disp Refills   ibuprofen (ADVIL) 800 MG tablet [Pharmacy Med Name: IBUPROFEN 800 MG TABLET] 30 tablet 0    Sig: TAKE 1 TABLET BY MOUTH EVERY 8 HOURS AS NEEDED      Analgesics:  NSAIDS Failed - 07/27/2020  9:23 PM      Failed - Cr in normal range and within 360 days    Creatinine  Date Value Ref Range Status  06/27/2012 0.72 0.60 - 1.30 mg/dL Final   Creatinine, Ser  Date Value Ref Range Status  02/23/2019 0.76 0.57 - 1.00 mg/dL Final   Creatinine, Urine  Date Value Ref Range Status  04/03/2018 112 mg/dL Final          Failed - HGB in normal range and within 360 days    Hemoglobin  Date Value Ref Range Status  02/23/2019 12.6 11.1 - 15.9 g/dL Final          Passed - Patient is not pregnant      Passed - Valid encounter within last 12 months    Recent Outpatient Visits           2 months ago Essential hypertension   [facility] Particia Nearing, [place]   5 months ago Essential hypertension   [facility], [place], [place]   6 months ago Morbid obesity with BMI of 50.0-59.9, adult [facility])   [facility] [place], [place], [place]   7 months ago Essential hypertension   [facility] Particia Nearing, [place]   8 months ago Class 3 severe obesity due to excess calories without serious comorbidity with body mass index (BMI) of 45.0 to 49.9 in adult [facility])   [facility] Maurice March, [place] Hews, [place]       Future Appointments             In 3 days Maurice March, [place], PA-C [facility]

## 2020-07-31 ENCOUNTER — Ambulatory Visit: Payer: Medicaid Other | Admitting: Family Medicine

## 2020-08-03 ENCOUNTER — Ambulatory Visit: Payer: Medicaid Other | Admitting: Family Medicine

## 2020-08-09 ENCOUNTER — Emergency Department
Admission: EM | Admit: 2020-08-09 | Discharge: 2020-08-09 | Disposition: A | Discharge status: Home / Self Care | Payer: BC Managed Care – PPO | Attending: Emergency Medicine | Admitting: Emergency Medicine

## 2020-08-09 ENCOUNTER — Other Ambulatory Visit: Payer: Self-pay

## 2020-08-09 DIAGNOSIS — M791 Myalgia, unspecified site: Secondary | ICD-10-CM | POA: Diagnosis not present

## 2020-08-09 DIAGNOSIS — Z794 Long term (current) use of insulin: Secondary | ICD-10-CM | POA: Diagnosis not present

## 2020-08-09 DIAGNOSIS — Z79899 Other long term (current) drug therapy: Secondary | ICD-10-CM | POA: Diagnosis not present

## 2020-08-09 DIAGNOSIS — I10 Essential (primary) hypertension: Secondary | ICD-10-CM | POA: Diagnosis not present

## 2020-08-09 DIAGNOSIS — Z20822 Contact with and (suspected) exposure to covid-19: Secondary | ICD-10-CM | POA: Diagnosis not present

## 2020-08-09 DIAGNOSIS — U071 COVID-19: Secondary | ICD-10-CM

## 2020-08-09 DIAGNOSIS — R519 Headache, unspecified: Secondary | ICD-10-CM | POA: Diagnosis present

## 2020-08-09 DIAGNOSIS — R6883 Chills (without fever): Secondary | ICD-10-CM | POA: Diagnosis not present

## 2020-08-09 LAB — SARS CORONAVIRUS 2 BY RT PCR (HOSPITAL ORDER, PERFORMED IN ~~LOC~~ HOSPITAL LAB): SARS Coronavirus 2: NEGATIVE

## 2020-08-09 MED ORDER — ONDANSETRON 4 MG PO TBDP: 4.0000 mg | ORAL_TABLET | Freq: Three times a day (TID) | ORAL | 0 refills | Status: AC | PRN

## 2020-08-09 MED ORDER — ALBUTEROL SULFATE HFA 108 (90 BASE) MCG/ACT IN AERS: 2.0000 | INHALATION_SPRAY | Freq: Four times a day (QID) | RESPIRATORY_TRACT | 2 refills | Status: AC | PRN

## 2020-08-09 MED ORDER — BENZONATATE 100 MG PO CAPS: 100.0000 mg | ORAL_CAPSULE | Freq: Three times a day (TID) | ORAL | 0 refills | Status: AC | PRN

## 2020-08-09 NOTE — ED Provider Notes (Signed)
Emergency Department Provider Note  ____________________________________________  Time seen: Approximately 10:22 PM  I have reviewed the triage vital signs and the nursing notes.   HISTORY  Chief Complaint Covid Exposure   Historian Patient     HPI Regina Chandler is a 29 y.o. female presents to the emergency department for COVID-19 testing.  Patient son recently tested positive.  Patient states that she has had chills, body aches and headache today.  No chest pain, chest tightness or abdominal pain.   Past Medical History:  Diagnosis Date  . Anemia   . Anxiety   . Bipolar 1 disorder (HCC)   . Depression   . GERD (gastroesophageal reflux disease)    NO MEDS  . Headache   . Hepatitis B affecting pregnancy 2007   No current treatment required, titers show up negative  . Hypertension      Immunizations up to date:  Yes.     Past Medical History:  Diagnosis Date  . Anemia   . Anxiety   . Bipolar 1 disorder (HCC)   . Depression   . GERD (gastroesophageal reflux disease)    NO MEDS  . Headache   . Hepatitis B affecting pregnancy 2007   No current treatment required, titers show up negative  . Hypertension     Patient Active Problem List   Diagnosis Date Noted  . Morbid obesity with BMI of 50.0-59.9, adult (HCC) 11/23/2019  . Hypertension   . Bilateral leg edema 09/07/2018  . Obesity 09/07/2018  . Chlamydia infection affecting pregnancy 04/23/2018  . Elevated blood pressure affecting pregnancy in third trimester, antepartum 04/03/2018  . Back pain affecting pregnancy in second trimester 12/19/2017  . Chronic hepatitis B affecting antepartum care of mother [facility]) 12/19/2017  . BMI 45.0-49.9, adult (HCC) 12/19/2017  . Headache in pregnancy, antepartum, third trimester 11/24/2016    Past Surgical History:  Procedure Laterality Date  . LAPAROSCOPIC TUBAL LIGATION N/A 07/30/2018   Procedure: LAPAROSCOPIC TUBAL LIGATION;  Surgeon: Vena Austria, MD;   Location: [facility] ORS;  Service: Gynecology;  Laterality: N/A;  . NO PAST SURGERIES    . NO PAST SURGERIES    . TUBAL LIGATION  07/30/2018   [facility])    Prior to Admission medications   Medication Sig Start Date End Date Taking? Authorizing Provider  acetaminophen (TYLENOL) 500 MG tablet Take 1 tablet (500 mg total) by mouth every 6 (six) hours as needed. 03/06/20   Enid Derry, PA-C  albuterol (VENTOLIN HFA) 108 (90 Base) MCG/ACT inhaler Inhale 2 puffs into the lungs every 6 (six) hours as needed for wheezing or shortness of breath. 08/09/20   Orvil Feil, PA-C  aluminum chloride (DRYSOL) 20 % external solution Apply topically at bedtime. 08/06/19   Particia Nearing, PA-C  benzonatate (TESSALON PERLES) 100 MG capsule Take 1 capsule (100 mg total) by mouth 3 (three) times daily as needed for up to 7 days for cough. 08/09/20 08/16/20  Orvil Feil, PA-C  fluticasone (FLONASE) 50 MCG/ACT nasal spray Place 2 sprays into both nostrils daily. 03/06/20 03/06/21  Enid Derry, PA-C  ibuprofen (ADVIL) 800 MG tablet TAKE 1 TABLET BY MOUTH EVERY 8 HOURS AS NEEDED 07/28/20   Particia Nearing, PA-C  Insulin Pen Needle (NOVOFINE) 32G X 6 MM MISC 1 application by Does not apply route daily. 12/21/19   Nadara Mustard, MD  ketoconazole (NIZORAL) 2 % cream APPLY TO AFFECTED AREA TWICE A DAY 12/17/19   Particia Nearing, PA-C  Liraglutide -Weight Management (SAXENDA) 18 MG/3ML SOPN Inject 3 mg into the skin daily. 12/21/19   Nadara Mustard, MD  lisinopril-hydrochlorothiazide (ZESTORETIC) 10-12.5 MG tablet TAKE 1 TABLET BY MOUTH EVERY DAY 06/16/20   Particia Nearing, PA-C  omeprazole (PRILOSEC) 40 MG capsule Take 1 capsule (40 mg total) by mouth daily. 12/17/19   Particia Nearing, PA-C  ondansetron (ZOFRAN ODT) 4 MG disintegrating tablet Take 1 tablet (4 mg total) by mouth every 8 (eight) hours as needed for up to 5 days. 08/09/20 08/14/20  Orvil Feil, PA-C  SPRINTEC 28 0.25-35  MG-MCG tablet TAKE 1 TABLET BY MOUTH EVERY DAY 10/24/19   Particia Nearing, PA-C    Allergies Penicillins  Family History  Problem Relation Age of Onset  . Hypertension Sister   . Hypertension Brother   . Hypertension Maternal Grandmother   . Diabetes Maternal Grandmother   . Hypertension Maternal Grandfather   . Hypertension Paternal Grandmother   . Hypertension Paternal Grandfather   . Cancer Neg Hx   . Heart disease Neg Hx   . Stroke Neg Hx     Social History Social History   Tobacco Use  . Smoking status: Never Smoker  . Smokeless tobacco: Never Used  Vaping Use  . Vaping Use: Never used  Substance Use Topics  . Alcohol use: Not Currently    Comment: RARE  . Drug use: No      Review of Systems  Constitutional: Patient has fever.  Eyes: No visual changes. No discharge ENT: Patient has congestion.  Cardiovascular: no chest pain. Respiratory: Patient has cough.  Gastrointestinal: No abdominal pain.  No nausea, no vomiting. Patient had diarrhea.  Genitourinary: Negative for dysuria. No hematuria Musculoskeletal: Patient has myalgias.  Skin: Negative for rash, abrasions, lacerations, ecchymosis. Neurological: Patient has headache, no focal weakness or numbness.     ____________________________________________   PHYSICAL EXAM:  VITAL SIGNS: ED Triage Vitals  Enc Vitals Group     BP 08/09/20 1923 119/78     Pulse Rate 08/09/20 1923 71     Resp 08/09/20 1923 20     Temp 08/09/20 1923 100 F (37.8 C)     Temp Source 08/09/20 1923 Oral     SpO2 08/09/20 1923 100 %     Weight 08/09/20 1924 (!) 310 lb (140.6 kg)     Height 08/09/20 1924 5\' 5"  (1.651 m)     Head Circumference --      Peak Flow --      Pain Score 08/09/20 1924 8     Pain Loc --      Pain Edu? --      Excl. in GC? --      Constitutional: Alert and oriented. Patient is lying supine. Eyes: Conjunctivae are normal. PERRL. EOMI. Head: Atraumatic. ENT:      Ears: Tympanic  membranes are mildly injected with mild effusion bilaterally.       Nose: No congestion/rhinnorhea.      Mouth/Throat: Mucous membranes are moist. Posterior pharynx is mildly erythematous.  Hematological/Lymphatic/Immunilogical: No cervical lymphadenopathy.  Cardiovascular: Normal rate, regular rhythm. Normal S1 and S2.  Good peripheral circulation. Respiratory: Normal respiratory effort without tachypnea or retractions. Lungs CTAB. Good air entry to the bases with no decreased or absent breath sounds. Gastrointestinal: Bowel sounds 4 quadrants. Soft and nontender to palpation. No guarding or rigidity. No palpable masses. No distention. No CVA tenderness. Musculoskeletal: Full range of motion to all extremities. No gross deformities appreciated.  Neurologic:  Normal speech and language. No gross focal neurologic deficits are appreciated.  Skin:  Skin is warm, dry and intact. No rash noted. Psychiatric: Mood and affect are normal. Speech and behavior are normal. Patient exhibits appropriate insight and judgement.    ____________________________________________   LABS (all labs ordered are listed, but only abnormal results are displayed)  Labs Reviewed  SARS CORONAVIRUS 2 BY RT PCR (HOSPITAL ORDER, PERFORMED IN [facility])   ____________________________________________  EKG   ____________________________________________  RADIOLOGY   No results found.  ____________________________________________    PROCEDURES  Procedure(s) performed:     Procedures     Medications - No data to display   ____________________________________________   INITIAL IMPRESSION / ASSESSMENT AND PLAN / ED COURSE  Pertinent labs & imaging results that were available during my care of the patient were reviewed by me and considered in my medical decision making (see chart for details).      Assessment and plan: COVID-80 29 year old female presents to the emergency  department with chills, headache, low-grade fever and body aches after her son recently tested positive for COVID-19.  I am highly suspicious for legitimate COVID-19 infection despite negative testing results in the emergency department tonight.  Patient was discharged with albuterol, Tessalon Perles and Zofran.  Return precautions were given to return with new or worsening symptoms.  All patient questions were answered.    ____________________________________________  FINAL CLINICAL IMPRESSION(S) / ED DIAGNOSES  Final diagnoses:  COVID-19      NEW MEDICATIONS STARTED DURING THIS VISIT:  ED Discharge Orders         Ordered    ondansetron (ZOFRAN ODT) 4 MG disintegrating tablet  Every 8 hours PRN        08/09/20 2153    benzonatate (TESSALON PERLES) 100 MG capsule  3 times daily PRN        08/09/20 2153    albuterol (VENTOLIN HFA) 108 (90 Base) MCG/ACT inhaler  Every 6 hours PRN        08/09/20 2153              This chart was dictated using voice recognition software/Dragon. Despite best efforts to proofread, errors can occur which can change the meaning. Any change was purely unintentional.     Orvil Feil, PA-C 08/09/20 2225    Arnaldo Natal, MD 08/09/20 2236

## 2020-08-09 NOTE — Discharge Instructions (Signed)
You have been prescribed albuterol for shortness of breath. You have been prescribed Tessalon Perles for cough.  You have been prescribed zofran for nausea.

## 2020-08-09 NOTE — ED Triage Notes (Signed)
Pt in with co chills and headaches. Also co body aches, son tested positive for covid yesterday.

## 2020-08-10 ENCOUNTER — Telehealth: Payer: Self-pay | Admitting: *Deleted

## 2020-08-10 NOTE — Telephone Encounter (Signed)
Contacted patient Transition Care Management Follow-up Telephone Call  Date of discharge and from where: 08/09/20, [facility]  How have you been since you were released from the hospital? "Just fine"  Any questions or concerns? No  Items Reviewed:  Did the pt receive and understand the discharge instructions provided? Yes   Medications obtained and verified? Yes   Any new allergies since your discharge? No   Dietary orders reviewed? NO  Do you have support at home? Yes family  Functional Questionnaire: (I = Independent and D = Dependent) ADLs: I  Bathing/Dressing- I  Meal Prep- I  Eating- I  Maintaining continence- I  Transferring/Ambulation- I  Managing Meds- I  Follow up appointments reviewed:   PCP Hospital f/u appt confirmed? No patient to call [facility] on 08/10/20 to schedule follow up appt  Specialist Hospital f/u appt confirmed? No    Are transportation arrangements needed? No   If their condition worsens, is the pt aware to call PCP or go to the Emergency Dept.? YES/  Was the patient provided with contact information for the PCP's office or ED? YES  Was to pt encouraged to call back with questions or concerns? YES  Burnard Bunting, RN, BSN, CCRN Patient Engagement Center [phone]

## 2020-08-24 ENCOUNTER — Ambulatory Visit: Payer: Medicaid Other | Admitting: Family Medicine

## 2020-08-28 ENCOUNTER — Ambulatory Visit: Payer: Medicaid Other | Admitting: Nurse Practitioner

## 2020-09-12 ENCOUNTER — Ambulatory Visit: Payer: Medicaid Other | Admitting: Nurse Practitioner

## 2020-09-19 ENCOUNTER — Other Ambulatory Visit: Payer: Self-pay

## 2020-09-19 ENCOUNTER — Ambulatory Visit (INDEPENDENT_AMBULATORY_CARE_PROVIDER_SITE_OTHER): Payer: BC Managed Care – PPO | Admitting: Nurse Practitioner

## 2020-09-19 ENCOUNTER — Encounter: Payer: Self-pay | Admitting: Nurse Practitioner

## 2020-09-19 VITALS — BP 132/83 | HR 86 | Temp 98.2°F | Wt 318.0 lb

## 2020-09-19 DIAGNOSIS — Z23 Encounter for immunization: Secondary | ICD-10-CM

## 2020-09-19 DIAGNOSIS — N760 Acute vaginitis: Secondary | ICD-10-CM

## 2020-09-19 DIAGNOSIS — I10 Essential (primary) hypertension: Secondary | ICD-10-CM | POA: Diagnosis not present

## 2020-09-19 DIAGNOSIS — B9689 Other specified bacterial agents as the cause of diseases classified elsewhere: Secondary | ICD-10-CM

## 2020-09-19 DIAGNOSIS — R300 Dysuria: Secondary | ICD-10-CM

## 2020-09-19 DIAGNOSIS — N898 Other specified noninflammatory disorders of vagina: Secondary | ICD-10-CM | POA: Diagnosis not present

## 2020-09-19 DIAGNOSIS — Z6841 Body Mass Index (BMI) 40.0 and over, adult: Secondary | ICD-10-CM

## 2020-09-19 DIAGNOSIS — R202 Paresthesia of skin: Secondary | ICD-10-CM

## 2020-09-19 DIAGNOSIS — R200 Anesthesia of skin: Secondary | ICD-10-CM

## 2020-09-19 LAB — MICROSCOPIC EXAMINATION
Bacteria, UA: NONE SEEN
RBC, Urine: NONE SEEN /hpf (ref 0–2)

## 2020-09-19 LAB — URINALYSIS, ROUTINE W REFLEX MICROSCOPIC
Bilirubin, UA: NEGATIVE
Glucose, UA: NEGATIVE
Nitrite, UA: NEGATIVE
Protein,UA: NEGATIVE
RBC, UA: NEGATIVE
Specific Gravity, UA: 1.025 (ref 1.005–1.030)
Urobilinogen, Ur: 1 mg/dL (ref 0.2–1.0)
pH, UA: 5.5 (ref 5.0–7.5)

## 2020-09-19 LAB — WET PREP FOR TRICH, YEAST, CLUE
Clue Cell Exam: POSITIVE — AB
Trichomonas Exam: NEGATIVE
Yeast Exam: NEGATIVE

## 2020-09-19 MED ORDER — LISINOPRIL-HYDROCHLOROTHIAZIDE 10-12.5 MG PO TABS: 1.0000 | ORAL_TABLET | Freq: Every day | ORAL | 1 refills | Status: DC

## 2020-09-19 MED ORDER — HYDROCHLOROTHIAZIDE 12.5 MG PO CAPS: 12.5000 mg | ORAL_CAPSULE | Freq: Every day | ORAL | 0 refills | Status: DC

## 2020-09-19 MED ORDER — METRONIDAZOLE 500 MG PO TABS: 500.0000 mg | ORAL_TABLET | Freq: Two times a day (BID) | ORAL | 0 refills | Status: DC

## 2020-09-19 MED ORDER — METRONIDAZOLE 500 MG PO TABS: 500.0000 mg | ORAL_TABLET | Freq: Two times a day (BID) | ORAL | 0 refills | Status: AC

## 2020-09-19 NOTE — Progress Notes (Signed)
BP 132/83   Pulse 86   Temp 98.2 F (36.8 C) (Oral)   Wt (!) 318 lb (144.2 kg)   LMP 08/29/2020 (Exact Date)   SpO2 97%   BMI 52.92 kg/m    Subjective:    Patient ID: Regina Chandler, female    DOB: [birth year], 29 y.o.   MRN: [id number]  HPI: Regina Chandler is a 29 y.o. female presenting to discuss ongoing obesity, blood pressure issues, and urinary tract infection symptoms.  Chief Complaint  Patient presents with  . Obesity  . Hypertension  . Urinary Tract Infection    pt states she has been having some itching and uncomfortable feeling with urination. States she did recently try scented pads and started the uncomfortable feeling afterwards.   HYPERTENSION Patient does not think the blood pressure medication is working [employer].  Has been having more headaches.  Lost her blood pressure cuff so is not able to check her blood pressure at home anymore. Hypertension status: controlled  Satisfied with current treatment? no Duration of hypertension: chronic BP monitoring frequency:  not checking BP medication side effects:  no Medication compliance: excellent compliance Previous BP meds: lisinopril- hctz 10-12.5 Aspirin: no Recurrent headaches: yes Visual changes: no Palpitations: no Dyspnea: no Chest pain: no Lower extremity edema: yes Dizzy/lightheaded: no  URINARY SYMPTOMS Onset: ~ 10 day ago Dysuria: no Urinary frequency: yes Urgency: no Small volume voids: yes Symptom severity: mild Urinary incontinence: no Foul odor: no Hematuria: no Abdominal pain: no Back pain: no Suprapubic pain/pressure: yes; cramping Flank pain: no Fever:  No Nausea: no Vomiting: no Relief with cranberry juice: no Relief with pyridium: no Status: stable Previous urinary tract infection: yes Recurrent urinary tract infection: no Sexual activity: currently sexually active History of sexually transmitted disease: yes Vaginal discharge: no Vaginal itchiness: yes Treatments  attempted: cranberry, increasing fluids   WEIGHT GAIN Stopped taking Saxenda about 2 months ago because felt she plateued in weight.  Has gained weight in the past 2 months and feels discouraged by this. Duration: months to years Previous attempts at weight loss: yes Complications of obesity: hypertension Peak weight: does not remember Weight loss goal: 30 lbs Weight loss to date: unknown Requesting obesity pharmacotherapy: yes Current weight loss supplements/medications: previous took [place] with good response at first Previous weight loss supplements/meds: yes  HAND PAIN Patient reports bilateral hand pain that gets worse at night.  Does have significant numbness and tingling and is frequently shaking her hands to "help with circulation."  Recently, the pain has been affecting her sleep.  She works in a [employer] and has worked with her hands for the past 6 years. Duration: months -- year Involved hand: bilateral Mechanism of injury: unknown Location: diffuse Onset: sudden Severity: severe  Quality: numbness/tingling Frequency: intermittent Radiation: no Aggravating factors: using hands a lot, night time Alleviating factors: letting hands hang off of the bed, shaking Treatments attempted: hand braces Relief with NSAIDs?: mild Weakness: yes Numbness: yes Redness: no Swelling:no Bruising: no Fevers: no  Allergies  Allergen Reactions  . Penicillins Swelling    Has patient had a PCN reaction causing immediate rash, facial/tongue/throat swelling, SOB or lightheadedness with hypotension: No Has patient had a PCN reaction causing severe rash involving mucus membranes or skin necrosis: No Has patient had a PCN reaction that required hospitalization No Has patient had a PCN reaction occurring within the last 10 years: No If all of the above answers are "NO", then may proceed with Cephalosporin  use.   Outpatient Encounter Medications as of 09/19/2020  Medication Sig    . acetaminophen (TYLENOL) 500 MG tablet Take 1 tablet (500 mg total) by mouth every 6 (six) hours as needed.  Marland Kitchen. albuterol (VENTOLIN HFA) 108 (90 Base) MCG/ACT inhaler Inhale 2 puffs into the lungs every 6 (six) hours as needed for wheezing or shortness of breath.  Marland Kitchen. aluminum chloride (DRYSOL) 20 % external solution Apply topically at bedtime.  Marland Kitchen. ibuprofen (ADVIL) 800 MG tablet TAKE 1 TABLET BY MOUTH EVERY 8 HOURS AS NEEDED  . ketoconazole (NIZORAL) 2 % cream APPLY TO AFFECTED AREA TWICE A DAY  . lisinopril-hydrochlorothiazide (ZESTORETIC) 10-12.5 MG tablet Take 1 tablet by mouth daily.  . SPRINTEC 28 0.25-35 MG-MCG tablet TAKE 1 TABLET BY MOUTH EVERY DAY  . [DISCONTINUED] lisinopril-hydrochlorothiazide (ZESTORETIC) 10-12.5 MG tablet TAKE 1 TABLET BY MOUTH EVERY DAY  . hydrochlorothiazide (MICROZIDE) 12.5 MG capsule Take 1 capsule (12.5 mg total) by mouth daily.  . Insulin Pen Needle (NOVOFINE) 32G X 6 MM MISC 1 application by Does not apply route daily. (Patient not taking: Reported on 09/19/2020)  . Liraglutide -Weight Management (SAXENDA) 18 MG/3ML SOPN Inject 3 mg into the skin daily. (Patient not taking: Reported on 09/19/2020)  . metroNIDAZOLE (FLAGYL) 500 MG tablet Take 1 tablet (500 mg total) by mouth 2 (two) times daily for 7 days.  . [DISCONTINUED] fluticasone (FLONASE) 50 MCG/ACT nasal spray Place 2 sprays into both nostrils daily.  . [DISCONTINUED] metroNIDAZOLE (FLAGYL) 500 MG tablet Take 1 tablet (500 mg total) by mouth 2 (two) times daily for 7 days.  . [DISCONTINUED] omeprazole (PRILOSEC) 40 MG capsule Take 1 capsule (40 mg total) by mouth daily. (Patient not taking: Reported on 09/19/2020)   No facility-administered encounter medications on file as of 09/19/2020.   Patient Active Problem List   Diagnosis Date Noted  . Bacterial vaginosis 09/20/2020  . Numbness and tingling in both hands 09/20/2020  . BMI 50.0-59.9, adult (HCC) 11/23/2019  . Essential hypertension   .  Bilateral leg edema 09/07/2018  . Obesity 09/07/2018  . Chlamydia infection affecting pregnancy 04/23/2018  . Elevated blood pressure affecting pregnancy in third trimester, antepartum 04/03/2018  . Back pain affecting pregnancy in second trimester 12/19/2017  . Chronic hepatitis B affecting antepartum care of mother [facility](HCC) 12/19/2017  . BMI 45.0-49.9, adult (HCC) 12/19/2017  . Headache in pregnancy, antepartum, third trimester 11/24/2016   Past Medical History:  Diagnosis Date  . Anemia   . Anxiety   . Bipolar 1 disorder (HCC)   . Depression   . GERD (gastroesophageal reflux disease)    NO MEDS  . Headache   . Hepatitis B affecting pregnancy 2007   No current treatment required, titers show up negative  . Hypertension    Relevant past medical, surgical, family and social history reviewed and updated as indicated. Interim medical history since our last visit reviewed.  Review of Systems  Constitutional: Negative.  Negative for activity change, appetite change, fatigue and fever.  Respiratory: Negative for cough, shortness of breath and wheezing.   Cardiovascular: Positive for leg swelling. Negative for chest pain and palpitations.  Gastrointestinal: Negative.   Genitourinary: Positive for decreased urine volume, frequency, pelvic pain and vaginal discharge. Negative for difficulty urinating, dysuria, enuresis, flank pain, hematuria, urgency, vaginal bleeding and vaginal pain.       + vaginal itching  Musculoskeletal: Positive for arthralgias (bilateral hand pain with numbness).  Skin: Negative.   Neurological: Positive for numbness (hands) and  headaches. Negative for dizziness, weakness and light-headedness.    Per HPI unless specifically indicated above     Objective:    BP 132/83   Pulse 86   Temp 98.2 F (36.8 C) (Oral)   Wt (!) 318 lb (144.2 kg)   LMP 08/29/2020 (Exact Date)   SpO2 97%   BMI 52.92 kg/m   Wt Readings from Last 3 Encounters:  09/19/20 (!) 318 lb  (144.2 kg)  08/09/20 (!) 310 lb (140.6 kg)  12/30/16 275 lb 9.6 oz (125 kg)    Physical Exam Vitals and nursing note reviewed.  Constitutional:      General: She is not in acute distress.    Appearance: Normal appearance. She is obese. She is not toxic-appearing.  HENT:     Head: Normocephalic and atraumatic.     Right Ear: External ear normal.     Left Ear: External ear normal.  Eyes:     General: No scleral icterus.    Extraocular Movements: Extraocular movements intact.  Neck:     Vascular: No carotid bruit.  Cardiovascular:     Rate and Rhythm: Normal rate and regular rhythm.     Heart sounds: Normal heart sounds. No murmur heard.   Pulmonary:     Effort: Pulmonary effort is normal. No respiratory distress.     Breath sounds: Normal breath sounds. No wheezing, rhonchi or rales.  Abdominal:     General: Abdomen is flat. Bowel sounds are normal.     Palpations: Abdomen is soft.     Tenderness: There is no abdominal tenderness.  Genitourinary:    Comments: Deferred using shared decision making Musculoskeletal:        General: No swelling or tenderness. Normal range of motion.     Cervical back: Normal range of motion.     Right lower leg: Edema (trace) present.     Left lower leg: Edema (trace) present.     Comments: + Phalen test  Skin:    General: Skin is warm and dry.     Capillary Refill: Capillary refill takes less than 2 seconds.     Coloration: Skin is not jaundiced or pale.     Findings: No bruising or erythema.  Neurological:     General: No focal deficit present.     Mental Status: She is alert and oriented to person, place, and time.     Motor: No weakness.     Gait: Gait normal.  Psychiatric:        Mood and Affect: Mood normal.        Behavior: Behavior normal.        Thought Content: Thought content normal.        Judgment: Judgment normal.        Assessment & Plan:   Problem List Items Addressed This Visit      Cardiovascular and Mediastinum    Essential hypertension    Chronic, ongoing.  Patient reports elevated blood pressures at home back when she was checking blood pressure.  Blood pressure mildly elevated today in clinic.  Will continue lisinopril hydrochlorothiazide 10-12.5 and add in 12.5 of hydrochlorothiazide.  Previously did not do well with increase in combination pill.  Encouraged patient to check her blood pressure at home and notify clinic if running low.  We will follow-up in 4 weeks.      Relevant Medications   lisinopril-hydrochlorothiazide (ZESTORETIC) 10-12.5 MG tablet   hydrochlorothiazide (MICROZIDE) 12.5 MG capsule     Genitourinary  Bacterial vaginosis    Acute, ongoing.  This is likely the cause of her vaginal and urinary symptoms today.  We will send urine for culture just in case.  Will treat with 7 days of twice daily Flagyl.  If symptoms persist or worsen, return to clinic.      Relevant Medications   metroNIDAZOLE (FLAGYL) 500 MG tablet     Other   BMI 50.0-59.9, adult (HCC) - Primary    Ongoing.  Patient continues with desire for weight loss but has been unable to achieve significant weight loss on Saxenda in past.  Will refer to weight loss clinic for multidisciplinary approach to weight loss.  Patient in agreement with plan.      Relevant Orders   Amb Ref to Medical Weight Management   Numbness and tingling in both hands    Chronic, ongoing.  With positive Phalen's test, likely etiology is carpal tunnel syndrome-we will send referral to orthopedic surgeon today.  Encourage patient to wear wrist braces every night to help with pain at nighttime.  Encouraged use of Tylenol instead of ibuprofen.      Relevant Orders   Ambulatory referral to Orthopedic Surgery    Other Visit Diagnoses    Vaginal itching       Relevant Orders   WET PREP FOR TRICH, YEAST, CLUE (Completed)   Dysuria       Relevant Orders   Urinalysis, Routine w reflex microscopic (Completed)   Need for influenza vaccination        Relevant Orders   Flu Vaccine QUAD 36+ mos IM (Completed)       Follow up plan: Return in about 4 weeks (around 10/17/2020) for BP f/u.

## 2020-09-19 NOTE — Patient Instructions (Addendum)
Influenza (Flu) Vaccine (Inactivated or Recombinant): What You Need to Know 1. Why get vaccinated? Influenza vaccine can prevent influenza (flu). Flu is a contagious disease that spreads around the [place] every year, usually between October and May. Anyone can get the flu, but it is more dangerous for some people. Infants and young children, people 31 years of age and older, pregnant women, and people with certain health conditions or a weakened immune system are at greatest risk of flu complications. Pneumonia, bronchitis, sinus infections and ear infections are examples of flu-related complications. If you have a medical condition, such as heart disease, cancer or diabetes, flu can make it worse. Flu can cause fever and chills, sore throat, muscle aches, fatigue, cough, headache, and runny or stuffy nose. Some people may have vomiting and diarrhea, though this is more common in children than adults. Each year thousands of people in the [place] die from flu, and many more are hospitalized. Flu vaccine prevents millions of illnesses and flu-related visits to the doctor each year. 2. Influenza vaccine CDC recommends everyone 57 months of age and older get vaccinated every flu season. Children 6 months through 2 years of age may need 2 doses during a single flu season. Everyone else needs only 1 dose each flu season. It takes about 2 weeks for protection to develop after vaccination. There are many flu viruses, and they are always changing. Each year a new flu vaccine is made to protect against three or four viruses that are likely to cause disease in the upcoming flu season. Even when the vaccine doesn't exactly match these viruses, it may still provide some protection. Influenza vaccine does not cause flu. Influenza vaccine may be given at the same time as other vaccines. 3. Talk with your health care provider Tell your vaccine provider if the person getting the vaccine:  Has had an  allergic reaction after a previous dose of influenza vaccine, or has any severe, life-threatening allergies.  Has ever had Guillain-Barr Syndrome (also called GBS). In some cases, your health care provider may decide to postpone influenza vaccination to a future visit. People with minor illnesses, such as a cold, may be vaccinated. People who are moderately or severely ill should usually wait until they recover before getting influenza vaccine. Your health care provider can give you more information. 4. Risks of a vaccine reaction  Soreness, redness, and swelling where shot is given, fever, muscle aches, and headache can happen after influenza vaccine.  There may be a very small increased risk of Guillain-Barr Syndrome (GBS) after inactivated influenza vaccine (the flu shot). Young children who get the flu shot along with pneumococcal vaccine (PCV13), and/or DTaP vaccine at the same time might be slightly more likely to have a seizure caused by fever. Tell your health care provider if a child who is getting flu vaccine has ever had a seizure. People sometimes faint after medical procedures, including vaccination. Tell your provider if you feel dizzy or have vision changes or ringing in the ears. As with any medicine, there is a very remote chance of a vaccine causing a severe allergic reaction, other serious injury, or death. 5. What if there is a serious problem? An allergic reaction could occur after the vaccinated person leaves the clinic. If you see signs of a severe allergic reaction (hives, swelling of the face and throat, difficulty breathing, a fast heartbeat, dizziness, or weakness), call 9-1-1 and get the person to the nearest hospital. For other signs that  concern you, call your health care provider. Adverse reactions should be reported to the Vaccine Adverse Event Reporting System (VAERS). Your health care provider will usually file this report, or you can do it yourself. Visit the  VAERS website at www.vaers.LAgents.no or call 513-589-2878.VAERS is only for reporting reactions, and VAERS staff do not give medical advice. 6. The National Vaccine Injury Compensation Program The [employer] Vaccine Injury Compensation Program (VICP) is a federal program that was created to compensate people who may have been injured by certain vaccines. Visit the VICP website at SpiritualWord.at or call 267-732-4882 to learn about the program and about filing a claim. There is a time limit to file a claim for compensation. 7. How can I learn more?  Ask your healthcare provider.  Call your local or state health department.  Contact the Centers for Disease Control and Prevention (CDC): ? Call (470)169-7332 (1-800-CDC-INFO) or ? Visit CDC's BiotechRoom.com.cy Vaccine Information Statement (Interim) Inactivated Influenza Vaccine (07/23/2018) This information is not intended to replace advice given to you by your health care provider. Make sure you discuss any questions you have with your health care provider. Document Revised: 03/16/2019 Document Reviewed: 07/27/2018 Elsevier Patient Education  2020 Elsevier Inc.  DASH Eating Plan DASH stands for "Dietary Approaches to Stop Hypertension." The DASH eating plan is a healthy eating plan that has been shown to reduce high blood pressure (hypertension). It may also reduce your risk for type 2 diabetes, heart disease, and stroke. The DASH eating plan may also help with weight loss. What are tips for following this plan?  General guidelines  Avoid eating more than 2,300 mg (milligrams) of salt (sodium) a day. If you have hypertension, you may need to reduce your sodium intake to 1,500 mg a day.  Limit alcohol intake to no more than 1 drink a day for nonpregnant women and 2 drinks a day for men. One drink equals 12 oz of beer, 5 oz of wine, or 1 oz of hard liquor.  Work with your health care provider to maintain a healthy body weight  or to lose weight. Ask what an ideal weight is for you.  Get at least 30 minutes of exercise that causes your heart to beat faster (aerobic exercise) most days of the week. Activities may include walking, swimming, or biking.  Work with your health care provider or diet and nutrition specialist (dietitian) to adjust your eating plan to your individual calorie needs. Reading food labels   Check food labels for the amount of sodium per serving. Choose foods with less than 5 percent of the Daily Value of sodium. Generally, foods with less than 300 mg of sodium per serving fit into this eating plan.  To find whole grains, look for the word "whole" as the first word in the ingredient list. Shopping  Buy products labeled as "low-sodium" or "no salt added."  Buy fresh foods. Avoid canned foods and premade or frozen meals. Cooking  Avoid adding salt when cooking. Use salt-free seasonings or herbs instead of table salt or sea salt. Check with your health care provider or pharmacist before using salt substitutes.  Do not fry foods. Cook foods using healthy methods such as baking, boiling, grilling, and broiling instead.  Cook with heart-healthy oils, such as olive, canola, soybean, or sunflower oil. Meal planning  Eat a balanced diet that includes: ? 5 or more servings of fruits and vegetables each day. At each meal, try to fill half of your plate with fruits and  vegetables. ? Up to 6-8 servings of whole grains each day. ? Less than 6 oz of lean meat, poultry, or fish each day. A 3-oz serving of meat is about the same size as a deck of cards. One egg equals 1 oz. ? 2 servings of low-fat dairy each day. ? A serving of nuts, seeds, or beans 5 times each week. ? Heart-healthy fats. Healthy fats called Omega-3 fatty acids are found in foods such as flaxseeds and coldwater fish, like sardines, salmon, and mackerel.  Limit how much you eat of the following: ? Canned or prepackaged foods. ? Food  that is high in trans fat, such as fried foods. ? Food that is high in saturated fat, such as fatty meat. ? Sweets, desserts, sugary drinks, and other foods with added sugar. ? Full-fat dairy products.  Do not salt foods before eating.  Try to eat at least 2 vegetarian meals each week.  Eat more home-cooked food and less restaurant, buffet, and fast food.  When eating at a restaurant, ask that your food be prepared with less salt or no salt, if possible. What foods are recommended? The items listed may not be a complete list. Talk with your dietitian about what dietary choices are best for you. Grains Whole-grain or whole-wheat bread. Whole-grain or whole-wheat pasta. Brown rice. Orpah Cobb. Bulgur. Whole-grain and low-sodium cereals. Pita bread. Low-fat, low-sodium crackers. Whole-wheat flour tortillas. Vegetables Fresh or frozen vegetables (raw, steamed, roasted, or grilled). Low-sodium or reduced-sodium tomato and vegetable juice. Low-sodium or reduced-sodium tomato sauce and tomato paste. Low-sodium or reduced-sodium canned vegetables. Fruits All fresh, dried, or frozen fruit. Canned fruit in natural juice (without added sugar). Meat and other protein foods Skinless chicken or [place]. Ground chicken or [place]. Pork with fat trimmed off. Fish and seafood. Egg whites. Dried beans, peas, or lentils. Unsalted nuts, nut butters, and seeds. Unsalted canned beans. Lean cuts of beef with fat trimmed off. Low-sodium, lean deli meat. Dairy Low-fat (1%) or fat-free (skim) milk. Fat-free, low-fat, or reduced-fat cheeses. Nonfat, low-sodium ricotta or cottage cheese. Low-fat or nonfat yogurt. Low-fat, low-sodium cheese. Fats and oils Soft margarine without trans fats. Vegetable oil. Low-fat, reduced-fat, or light mayonnaise and salad dressings (reduced-sodium). Canola, safflower, olive, soybean, and sunflower oils. Avocado. Seasoning and other foods Herbs. Spices. Seasoning mixes without salt.  Unsalted popcorn and pretzels. Fat-free sweets. What foods are not recommended? The items listed may not be a complete list. Talk with your dietitian about what dietary choices are best for you. Grains Baked goods made with fat, such as croissants, muffins, or some breads. Dry pasta or rice meal packs. Vegetables Creamed or fried vegetables. Vegetables in a cheese sauce. Regular canned vegetables (not low-sodium or reduced-sodium). Regular canned tomato sauce and paste (not low-sodium or reduced-sodium). Regular tomato and vegetable juice (not low-sodium or reduced-sodium). Rosita Fire. Olives. Fruits Canned fruit in a light or heavy syrup. Fried fruit. Fruit in cream or butter sauce. Meat and other protein foods Fatty cuts of meat. Ribs. Fried meat. Tomasa Blase. Sausage. Bologna and other processed lunch meats. Salami. Fatback. Hotdogs. Bratwurst. Salted nuts and seeds. Canned beans with added salt. Canned or smoked fish. Whole eggs or egg yolks. Chicken or [place] with skin. Dairy Whole or 2% milk, cream, and half-and-half. Whole or full-fat cream cheese. Whole-fat or sweetened yogurt. Full-fat cheese. Nondairy creamers. Whipped toppings. Processed cheese and cheese spreads. Fats and oils Butter. Stick margarine. Lard. Shortening. Ghee. Bacon fat. Tropical oils, such as coconut, palm kernel, or  palm oil. Seasoning and other foods Salted popcorn and pretzels. Onion salt, garlic salt, seasoned salt, table salt, and sea salt. Worcestershire sauce. Tartar sauce. Barbecue sauce. Teriyaki sauce. Soy sauce, including reduced-sodium. Steak sauce. Canned and packaged gravies. Fish sauce. Oyster sauce. Cocktail sauce. Horseradish that you find on the shelf. Ketchup. Mustard. Meat flavorings and tenderizers. Bouillon cubes. Hot sauce and Tabasco sauce. Premade or packaged marinades. Premade or packaged taco seasonings. Relishes. Regular salad dressings. Where to find more information:  National Heart, Lung, and Blood  Institute: PopSteam.is  American Heart Association: www.heart.org Summary  The DASH eating plan is a healthy eating plan that has been shown to reduce high blood pressure (hypertension). It may also reduce your risk for type 2 diabetes, heart disease, and stroke.  With the DASH eating plan, you should limit salt (sodium) intake to 2,300 mg a day. If you have hypertension, you may need to reduce your sodium intake to 1,500 mg a day.  When on the DASH eating plan, aim to eat more fresh fruits and vegetables, whole grains, lean proteins, low-fat dairy, and heart-healthy fats.  Work with your health care provider or diet and nutrition specialist (dietitian) to adjust your eating plan to your individual calorie needs. This information is not intended to replace advice given to you by your health care provider. Make sure you discuss any questions you have with your health care provider. Document Revised: 11/07/2017 Document Reviewed: 11/18/2016 Elsevier Patient Education  2020 [employer].

## 2020-09-20 DIAGNOSIS — N760 Acute vaginitis: Secondary | ICD-10-CM | POA: Insufficient documentation

## 2020-09-20 DIAGNOSIS — R200 Anesthesia of skin: Secondary | ICD-10-CM | POA: Insufficient documentation

## 2020-09-20 DIAGNOSIS — B9689 Other specified bacterial agents as the cause of diseases classified elsewhere: Secondary | ICD-10-CM | POA: Insufficient documentation

## 2020-09-20 NOTE — Assessment & Plan Note (Signed)
Ongoing.  Patient continues with desire for weight loss but has been unable to achieve significant weight loss on Saxenda in past.  Will refer to weight loss clinic for multidisciplinary approach to weight loss.  Patient in agreement with plan.

## 2020-09-20 NOTE — Assessment & Plan Note (Signed)
Chronic, ongoing.  Patient reports elevated blood pressures at home back when she was checking blood pressure.  Blood pressure mildly elevated today in clinic.  Will continue lisinopril hydrochlorothiazide 10-12.5 and add in 12.5 of hydrochlorothiazide.  Previously did not do well with increase in combination pill.  Encouraged patient to check her blood pressure at home and notify clinic if running low.  We will follow-up in 4 weeks.

## 2020-09-20 NOTE — Assessment & Plan Note (Addendum)
Chronic, ongoing.  With positive Phalen's test, likely etiology is carpal tunnel syndrome-we will send referral to orthopedic surgeon today.  Encourage patient to wear wrist braces every night to help with pain at nighttime.  Encouraged use of Tylenol instead of ibuprofen.

## 2020-09-20 NOTE — Assessment & Plan Note (Addendum)
Acute, ongoing.  This is likely the cause of her vaginal and urinary symptoms today.  We will send urine for culture just in case.  Will treat with 7 days of twice daily Flagyl.  If symptoms persist or worsen, return to clinic.

## 2020-10-05 ENCOUNTER — Other Ambulatory Visit: Payer: Self-pay

## 2020-10-05 DIAGNOSIS — I10 Essential (primary) hypertension: Secondary | ICD-10-CM

## 2020-10-06 ENCOUNTER — Encounter: Payer: BC Managed Care – PPO | Attending: Nurse Practitioner | Admitting: Dietician

## 2020-10-06 ENCOUNTER — Encounter: Payer: Self-pay | Admitting: Dietician

## 2020-10-06 ENCOUNTER — Other Ambulatory Visit: Payer: Self-pay

## 2020-10-06 VITALS — Ht 66.0 in | Wt 319.5 lb

## 2020-10-06 DIAGNOSIS — Z6841 Body Mass Index (BMI) 40.0 and over, adult: Secondary | ICD-10-CM | POA: Diagnosis not present

## 2020-10-06 MED ORDER — HYDROCHLOROTHIAZIDE 12.5 MG PO CAPS: 12.5000 mg | ORAL_CAPSULE | Freq: Every day | ORAL | 0 refills | Status: DC

## 2020-10-06 NOTE — Progress Notes (Signed)
Medical Nutrition Therapy: Visit start time: 0900  end time: 1030  Assessment:  Diagnosis: obesity Past medical history: HTN Psychosocial issues/ stress concerns: pt rates stress level as 'high' and feels 'ok' about stress management skills   Preferred learning method:  . Auditory . Visual . Hands-on  Current weight: 319.5 lbs  Height: 5'6" Medications, supplements: reconciled in medical record   Progress and evaluation:   Pt states her main goal is to loss weight, goal weight of 190-220 lbs with intermediate goal of 250 lbs   Pt reports she feels 'poor' about health  would like to improve swelling in feet and ankles, joint pain, heavy breathing when walking    Pt reports Saxenda, meal replacement shakes and low-calorie diets have not worked for weight loss  Lives with her four children, ages 2, 3, 8, 9  Pt states one of her children is borderline prediabetic, pt would like to set healthy example for children   Physical activity: ADLs  Dietary Intake:  Usual eating pattern includes 3 meals and 3 snacks per day. Dining out frequency: 8 meals per week.  Breakfast: bojangles/Mc[employer]ds biscuit (sweet tea, egg, cheese, cajun chicken); 2-3 frosted flakes breakfast bars  Snack: couple muffins  Lunch: baked chicken/steak with mashed potatoes/rice Snack: chips, cookies, debbie cakes  Supper: pizza; chicken with mashed pot/rice Snack: chips, icecream Beverages: large caramel iced coffee, fruit juice   Nutrition Care Education: Basic nutrition: basic food groups, appropriate nutrient balance, appropriate meal and snack schedule, general nutrition guidelines    Weight control: importance of low sugar and low fat choices, portion control strategies  Advanced nutrition:  dining out, food label reading Hypertension: identifying high sodium foods Other lifestyle changes:  Mindful eating practices   Nutritional Diagnosis:  [place]-3.3 Overweight/obesity As related to excess caloric intake  and inadequate physical activity .  As evidenced by pt current BMI of 51.57.  Intervention:  Discussion and instruction as noted above.  Established goals for additional change.   Increase fruit and vegetable intake   Incorporate vegetables at lunch and dinner   Incorporate more F/V at snacks  Do not skip breakfast, include whole fruit at breakfast   Try frozen veggies that come in microwaveable pouches (may be tender than fresh and low prep time)  Try canned fruit NOT in heavy syrup (mandarin oranges, peaches, pears, applesauce) for snacks + protein (cheese, PB)   Decrease sugar-sweetened beverage consumption   Switch from sweet to unsweet tea with sugar substitute   Switch from regular to diet soda  Drink at least 64oz water daily (add crystal light, lemon, or mio as needed)    Increase physical activity   Incrementally reintroduce exercise back into routine   150 minutes per week is recommendation   Try chair yoga or seated exercises on YouTube   Increase fiber intake   Eat at least 3 servings of whole grains a day  Increase fruit and vegetable intake  Decrease sodium intake   Reduce sodium intake to recommended 1500mg /day   Read nutrition labels on packaged foods to monitor sodium intake   400-600 mg/meal  <200 mg/snack   Decrease fast food frequency   Avoid processed meats    Decrease saturated fat intake   Try more plant-based sources of protein  Limit processed meats   Switch to low fat dairy products  Education Materials given:  . 10 healthy diet changes  . Mindful eating- hunger scale  . Food record . Plate Planner with food lists  .  Goals/ instructions  Learner/ who was taught:  . Patient  Level of understanding: Marland Kitchen Verbalizes/ demonstrates competency  Demonstrated degree of understanding via:   Teach back Learning barriers: . None  Willingness to learn/ readiness for change: . Eager, change in progress  Monitoring and  Evaluation:  Dietary intake, exercise, and body weight      follow up: December 10 at [facility]

## 2020-10-06 NOTE — Patient Instructions (Signed)
   Eat based on hunger and fullness cues   Portion control carb intake at meals and snacks   Read the nutrition labels based on the guidelines we discussed

## 2020-10-18 ENCOUNTER — Telehealth (INDEPENDENT_AMBULATORY_CARE_PROVIDER_SITE_OTHER): Payer: BC Managed Care – PPO | Admitting: Nurse Practitioner

## 2020-10-18 ENCOUNTER — Encounter: Payer: Self-pay | Admitting: Nurse Practitioner

## 2020-10-18 VITALS — BP 152/91 | HR 88 | Wt 317.0 lb

## 2020-10-18 DIAGNOSIS — J3489 Other specified disorders of nose and nasal sinuses: Secondary | ICD-10-CM | POA: Diagnosis not present

## 2020-10-18 MED ORDER — PREDNISONE 20 MG PO TABS: 40.0000 mg | ORAL_TABLET | Freq: Every day | ORAL | 0 refills | Status: AC

## 2020-10-18 MED ORDER — LORATADINE 10 MG PO TABS: 10.0000 mg | ORAL_TABLET | Freq: Every day | ORAL | 11 refills | Status: DC

## 2020-10-18 MED ORDER — FLUTICASONE PROPIONATE 50 MCG/ACT NA SUSP: 2.0000 | Freq: Every day | NASAL | 6 refills | Status: DC

## 2020-10-18 NOTE — Assessment & Plan Note (Signed)
Acute x 4 days.  Have recommended she obtain Covid testing and discussed with her, will obtain here at office, order in place and will alert staff to schedule.  At this time no abx therapy as symptoms < 7 days, discussed with patient.  Scripts for Prednisone burst, Flonase, and Claritin sent.  Educated her on each of these.  Recommend: - Increased rest - Increasing Fluids - Acetaminophen as needed for fever/pain.  - Salt water gargling, chloraseptic spray and throat lozenges - Mucinex.  - Saline sinus flushes or a neti pot.  - Humidifying the air Return to office for worsening or ongoing symptoms.

## 2020-10-18 NOTE — Progress Notes (Signed)
BP (!) 152/91   Pulse 88   Wt (!) 317 lb (143.8 kg)   BMI 51.17 kg/m    Subjective:    Patient ID: Regina Chandler, female    DOB: 01/01/1991, 29 y.o.   MRN: [id number]  HPI: Regina Chandler is a 29 y.o. female  Chief Complaint  Patient presents with  . Sinus Problem    pt states she has headache, facial pressure, runny nose   . Nasal Congestion    pt states she has a lot of congestion and sneezing for about 4 days   . Fatigue    pt states she has no energy, feels tired all the time     . This visit was completed via MyChart due to the restrictions of the COVID-19 pandemic. All issues as above were discussed and addressed. Physical exam was done as above through visual confirmation on MyChart. If it was felt that the patient should be evaluated in the office, they were directed there. The patient verbally consented to this visit. . Location of the patient: home . Location of the provider: work . Those involved with this call:  . Provider: Aura Dials, DNP . CMA: Wilhemena Durie, CMA . Front Desk/Registration: Harriet Pho  . Time spent on call: 20 minutes with patient face to face via video conference. More than 50% of this time was spent in counseling and coordination of care. 15 minutes total spent in review of patient's record and preparation of their chart.  . I verified patient identity using two factors (patient name and date of birth). Patient consents verbally to being seen via telemedicine visit today.    UPPER RESPIRATORY TRACT INFECTION Presents today for sinus infection symptoms.  Started about 4 days ago with headache and drainage.  Is Covid vaccinated with last vaccine in April.  No loss of taste or smell.  Fever: no Cough: no Shortness of breath: no Wheezing: no Chest pain: no Chest tightness: no Chest congestion: no Nasal congestion: yes Runny nose: yes Post nasal drip: yes Sneezing: yes Sore throat: no Swollen glands: no Sinus pressure:  yes Headache: yes Face pain: no Toothache: yes Ear pain: none Ear pressure: none Eyes red/itching:no Eye drainage/crusting: no  Vomiting: no Rash: no Fatigue: yes Sick contacts: no Strep contacts: no  Context: fluctuating Recurrent sinusitis: no Relief with OTC cold/cough medications: yes  Treatments attempted: cold/sinus   Relevant past medical, surgical, family and social history reviewed and updated as indicated. Interim medical history since our last visit reviewed. Allergies and medications reviewed and updated.  Review of Systems  Constitutional: Positive for fatigue. Negative for activity change, appetite change, chills and fever.  HENT: Positive for congestion, postnasal drip, rhinorrhea, sinus pressure, sinus pain and sneezing. Negative for ear discharge, ear pain, facial swelling, sore throat and voice change.   Eyes: Negative for pain and visual disturbance.  Respiratory: Negative for cough, chest tightness, shortness of breath and wheezing.   Cardiovascular: Negative for chest pain, palpitations and leg swelling.  Gastrointestinal: Negative.   Endocrine: Negative.   Musculoskeletal: Negative for myalgias.  Neurological: Positive for headaches. Negative for dizziness and numbness.  Psychiatric/Behavioral: Negative.     Per HPI unless specifically indicated above     Objective:    BP (!) 152/91   Pulse 88   Wt (!) 317 lb (143.8 kg)   BMI 51.17 kg/m   Wt Readings from Last 3 Encounters:  10/18/20 (!) 317 lb (143.8 kg)  10/06/20 (!) 319  lb 8 oz (144.9 kg)  09/19/20 (!) 318 lb (144.2 kg)    Physical Exam Vitals and nursing note reviewed.  Constitutional:      General: She is awake. She is not in acute distress.    Appearance: She is well-developed. She is not ill-appearing.  HENT:     Head: Normocephalic.     Right Ear: Hearing normal.     Left Ear: Hearing normal.  Eyes:     General: Lids are normal.        Right eye: No discharge.        Left eye:  No discharge.     Conjunctiva/sclera: Conjunctivae normal.  Pulmonary:     Effort: Pulmonary effort is normal. No accessory muscle usage or respiratory distress.  Musculoskeletal:     Cervical back: Normal range of motion.  Neurological:     Mental Status: She is alert and oriented to person, place, and time.  Psychiatric:        Attention and Perception: Attention normal.        Mood and Affect: Mood normal.        Behavior: Behavior normal. Behavior is cooperative.        Thought Content: Thought content normal.        Judgment: Judgment normal.     Results for orders placed or performed in visit on 09/19/20  WET PREP FOR TRICH, YEAST, CLUE   Specimen: Vaginal; Sterile Swab   Sterile Swab  Result Value Ref Range   Trichomonas Exam Negative Negative   Yeast Exam Negative Negative   Clue Cell Exam Positive (A) Negative  Microscopic Examination   Urine  Result Value Ref Range   WBC, UA 0-5 0 - 5 /hpf   RBC None seen 0 - 2 /hpf   Epithelial Cells (non renal) 0-10 0 - 10 /hpf   Bacteria, UA None seen None seen/Few  Urinalysis, Routine w reflex microscopic  Result Value Ref Range   Specific Gravity, UA 1.025 1.005 - 1.030   pH, UA 5.5 5.0 - 7.5   Color, UA Yellow Yellow   Appearance Ur Clear Clear   Leukocytes,UA Trace (A) Negative   Protein,UA Negative Negative/Trace   Glucose, UA Negative Negative   Ketones, UA Trace (A) Negative   RBC, UA Negative Negative   Bilirubin, UA Negative Negative   Urobilinogen, Ur 1.0 0.2 - 1.0 mg/dL   Nitrite, UA Negative Negative   Microscopic Examination See below:       Assessment & Plan:   Problem List Items Addressed This Visit      Other   Sinus pressure - Primary    Acute x 4 days.  Have recommended she obtain Covid testing and discussed with her, will obtain here at office, order in place and will alert staff to schedule.  At this time no abx therapy as symptoms < 7 days, discussed with patient.  Scripts for Prednisone burst,  Flonase, and Claritin sent.  Educated her on each of these.  Recommend: - Increased rest - Increasing Fluids - Acetaminophen as needed for fever/pain.  - Salt water gargling, chloraseptic spray and throat lozenges - Mucinex.  - Saline sinus flushes or a neti pot.  - Humidifying the air Return to office for worsening or ongoing symptoms.      Relevant Orders   Novel Coronavirus, NAA ([facility])      I discussed the assessment and treatment plan with the patient. The patient was provided an opportunity to  ask questions and all were answered. The patient agreed with the plan and demonstrated an understanding of the instructions.   The patient was advised to call back or seek an in-person evaluation if the symptoms worsen or if the condition fails to improve as anticipated.   I provided 21+ minutes of time during this encounter.  Follow up plan: Return if symptoms worsen or fail to improve.

## 2020-10-18 NOTE — Patient Instructions (Signed)
Sinus Headache  A sinus headache occurs when your sinuses become clogged or swollen. Sinuses are air-filled spaces in your skull that are behind the bones of your face and forehead. Sinus headaches can range from mild to severe. What are the causes? A sinus headache can result from various conditions that affect the sinuses. Common causes include:  Colds.  Sinus infections.  Allergies. Many people confuse sinus headaches with migraines or tension headaches because those headaches can also cause facial pain and nasal symptoms. What are the signs or symptoms? The main symptom of this condition is a headache that may feel like pain or pressure in your face, forehead, ears, or upper teeth. People who have a sinus headache often have other symptoms, such as:  Congested or runny nose.  Fever.  Inability to smell. Weather changes can make symptoms worse. How is this diagnosed? This condition may be diagnosed based on:  A physical exam and medical history.  Imaging tests, such as a CT scan or MRI, to check for problems with the sinuses.  Examination of the sinuses using a thin tool with a camera that is inserted through your nose (endoscopy). How is this treated? Treatment for this condition depends on the cause.  Sinus pain that is caused by a sinus infection may be treated with antibiotic medicine.  Sinus pain that is caused by allergies may be helped by allergy medicines (antihistamines) and medicated nasal sprays.  Sinus pain that is caused by congestion may be helped by rinsing out (flushing) the nose and sinuses with saline solution.  Sinus surgery may be needed in some cases if other treatments do not help. Follow these instructions at home: General instructions  If directed: ? Apply a warm, moist washcloth to your face to help relieve pain. ? Use a nasal saline wash. Medicines   Take over-the-counter and prescription medicines only as told by your health care  provider.  If you were prescribed an antibiotic medicine, take it as told by your health care provider. Do not stop taking the antibiotic even if you start to feel better.  If you have congestion, use a nasal spray to help lessen pressure. Hydrate and humidify  Drink enough water to keep your urine clear or pale yellow. Staying hydrated will help to thin your mucus.  Use a cool mist humidifier to keep the humidity level in your home above 50%.  Inhale steam for 10-15 minutes, 3-4 times a day or as told by your health care provider. You can do this in the bathroom while a hot shower is running.  Limit your exposure to cool or dry air. Contact a health care provider if:  You have a headache more than one time a week.  You have sensitivity to light or sound.  You develop a fever.  You feel nauseous or you vomit.  Your headaches do not get better with treatment. Many people think that they have a sinus headache when they actually have a migraine or a tension headache. Get help right away if:  You have vision problems.  You have sudden, severe pain in your face or head.  You have a seizure.  You are confused.  You have a stiff neck. Summary  A sinus headache occurs when your sinuses become clogged or swollen.  A sinus headache can result from various conditions that affect the sinuses, such as a cold, a sinus infection, or an allergy.  Treatment for this condition depends on the cause. It may include   medicine, such as antibiotics or antihistamines. This information is not intended to replace advice given to you by your health care provider. Make sure you discuss any questions you have with your health care provider. Document Revised: 11/07/2017 Document Reviewed: 09/05/2017 Elsevier Patient Education  2020 [employer]  

## 2020-10-19 ENCOUNTER — Other Ambulatory Visit: Payer: Medicaid Other

## 2020-10-20 ENCOUNTER — Ambulatory Visit: Payer: Medicaid Other | Admitting: Nurse Practitioner

## 2020-10-20 LAB — SARS-COV-2, NAA 2 DAY TAT

## 2020-10-20 LAB — NOVEL CORONAVIRUS, NAA: SARS-CoV-2, NAA: NOT DETECTED

## 2020-10-20 NOTE — Progress Notes (Signed)
Contacted via [employer]!!  Covid testing returned negative!!!

## 2020-10-27 ENCOUNTER — Other Ambulatory Visit: Payer: Self-pay | Admitting: Nurse Practitioner

## 2020-10-27 ENCOUNTER — Telehealth: Payer: Self-pay | Admitting: Family Medicine

## 2020-10-27 MED ORDER — AZITHROMYCIN 250 MG PO TABS: ORAL_TABLET | ORAL | 0 refills | Status: DC

## 2020-10-27 NOTE — Telephone Encounter (Signed)
Pt states she still is having SAME SX as she did last week. Pt states she doesn't feel good at all. Pt has pressure in face,coughing up phlegm, runny nose, really nauseous. Pt hoping to get abx or something else to help get rid of this.  [id number] - [place], [place] - [address] Phone:  [phone]  Fax:  [phone]     If you need to call her, pt states her phone is about to go dead and she can be reached at: [phone]

## 2020-10-27 NOTE — Telephone Encounter (Signed)
Sent in Azithromycin as we had discussed at recent visit if no improvement over 7 days would send this in.  However, if ongoing symptoms do request she return to office.

## 2020-10-27 NOTE — Telephone Encounter (Signed)
Called pt no answer left vm 

## 2020-10-30 NOTE — Telephone Encounter (Signed)
Called pt relayed Regina Chandler's message. Pt verbalized understanding

## 2020-11-17 ENCOUNTER — Ambulatory Visit: Payer: BC Managed Care – PPO | Admitting: Dietician

## 2020-11-24 ENCOUNTER — Encounter: Payer: Self-pay | Admitting: Dietician

## 2020-11-24 NOTE — Progress Notes (Signed)
Pt cancelled her appointment via the automated appointment reminder call and has not called to reschedule her appointment.  Pt is being discharged.

## 2020-11-28 ENCOUNTER — Telehealth: Payer: Self-pay | Admitting: Obstetrics and Gynecology

## 2020-11-28 NOTE — Telephone Encounter (Signed)
Voicemail box is full. 

## 2020-11-28 NOTE — Telephone Encounter (Signed)
-----   Message from Nadara Mustard, MD sent at 11/28/2020  1:38 PM EST ----- Regarding: appt [facility] Consultation w [facility].

## 2020-12-21 ENCOUNTER — Telehealth: Payer: Self-pay | Admitting: Family Medicine

## 2020-12-21 NOTE — Telephone Encounter (Signed)
I attempted to reach Regina Chandler today to schedule her a phone visit with the High Risk Managed  Medicaid RN Case Manager. I left a message with all of my information. If I do not hear back I will reach out to her again in the next 7-14 days.

## 2020-12-25 ENCOUNTER — Telehealth (INDEPENDENT_AMBULATORY_CARE_PROVIDER_SITE_OTHER): Payer: BC Managed Care – PPO | Admitting: Family Medicine

## 2020-12-25 ENCOUNTER — Encounter: Payer: Self-pay | Admitting: Family Medicine

## 2020-12-25 ENCOUNTER — Other Ambulatory Visit: Payer: Self-pay

## 2020-12-25 VITALS — Temp 98.8°F

## 2020-12-25 DIAGNOSIS — Z20822 Contact with and (suspected) exposure to covid-19: Secondary | ICD-10-CM | POA: Diagnosis not present

## 2020-12-25 MED ORDER — BENZONATATE 200 MG PO CAPS: 200.0000 mg | ORAL_CAPSULE | Freq: Two times a day (BID) | ORAL | 0 refills | Status: DC | PRN

## 2020-12-25 MED ORDER — PREDNISONE 50 MG PO TABS: 50.0000 mg | ORAL_TABLET | Freq: Every day | ORAL | 0 refills | Status: DC

## 2020-12-25 NOTE — Patient Instructions (Signed)
To schedule your appointment, please visit [email or web address] or call [phone] (Mon. - Fri. 7 a.m. to 7 p.m) 

## 2020-12-25 NOTE — Progress Notes (Signed)
Temp 98.8 F (37.1 C)    Subjective:    Patient ID: Regina Chandler, female    DOB: Sep 05, 1991, 30 y.o.   MRN: [id number]  HPI: IVORIE UPLINGER is a 30 y.o. female  Chief Complaint  Patient presents with  . URI    Pt states she has had a headache, fatigue, cough, sore throat, fever, and chills since Saturday. No current fever, but did have one Saturday and Sunday   UPPER RESPIRATORY TRACT INFECTION Duration: 2 days Worst symptom: headache, congestion, fatigue Fever: yes Cough: yes Shortness of breath: yes Wheezing: yes Chest pain: no Chest tightness: no Chest congestion: yes Nasal congestion: yes Runny nose: yes Post nasal drip: yes Sneezing: no Sore throat: yes Swollen glands: no Sinus pressure: yes Headache: yes Face pain: yes Toothache: no Ear pain: no  Ear pressure: no  Eyes red/itching:no Eye drainage/crusting: no  Vomiting: no Rash: no Fatigue: yes Sick contacts: no Strep contacts: no  Context: fluctuating Recurrent sinusitis: no Relief with OTC cold/cough medications: no  Treatments attempted: cold/sinus   Relevant past medical, surgical, family and social history reviewed and updated as indicated. Interim medical history since our last visit reviewed. Allergies and medications reviewed and updated.  Review of Systems  Constitutional: Positive for chills, diaphoresis, fatigue and fever. Negative for activity change, appetite change and unexpected weight change.  HENT: Positive for congestion, postnasal drip, rhinorrhea and sinus pressure. Negative for dental problem, drooling, ear discharge, ear pain, facial swelling, hearing loss, mouth sores, nosebleeds, sinus pain, sneezing, sore throat, tinnitus, trouble swallowing and voice change.   Eyes: Negative.   Respiratory: Positive for cough, chest tightness and shortness of breath. Negative for apnea, choking, wheezing and stridor.   Cardiovascular: Negative.   Gastrointestinal: Negative.     Per  HPI unless specifically indicated above     Objective:    Temp 98.8 F (37.1 C)   Wt Readings from Last 3 Encounters:  10/18/20 (!) 317 lb (143.8 kg)  10/06/20 (!) 319 lb 8 oz (144.9 kg)  09/19/20 (!) 318 lb (144.2 kg)    Physical Exam Vitals and nursing note reviewed.  Constitutional:      General: She is not in acute distress.    Appearance: Normal appearance. She is not ill-appearing, toxic-appearing or diaphoretic.  HENT:     Head: Normocephalic and atraumatic.     Right Ear: External ear normal.     Left Ear: External ear normal.     Nose: Nose normal.     Mouth/Throat:     Mouth: Mucous membranes are moist.     Pharynx: Oropharynx is clear.  Eyes:     General: No scleral icterus.       Right eye: No discharge.        Left eye: No discharge.     Conjunctiva/sclera: Conjunctivae normal.     Pupils: Pupils are equal, round, and reactive to light.  Pulmonary:     Effort: Pulmonary effort is normal. No respiratory distress.     Comments: Speaking in full sentences Musculoskeletal:        General: Normal range of motion.     Cervical back: Normal range of motion.  Skin:    Coloration: Skin is not jaundiced or pale.     Findings: No bruising, erythema, lesion or rash.  Neurological:     Mental Status: She is alert and oriented to person, place, and time. Mental status is at baseline.  Psychiatric:  Mood and Affect: Mood normal.        Behavior: Behavior normal.        Thought Content: Thought content normal.        Judgment: Judgment normal.     Results for orders placed or performed in visit on 10/18/20  Novel Coronavirus, NAA ([facility])   Specimen: Nasopharyngeal(NP) swabs in vial transport medium  Result Value Ref Range   SARS-CoV-2, NAA Not Detected Not Detected  SARS-COV-2, NAA 2 DAY TAT  Result Value Ref Range   SARS-CoV-2, NAA 2 DAY TAT Performed       Assessment & Plan:   Problem List Items Addressed This Visit   None   Visit Diagnoses     Suspected COVID-19 virus infection    -  Primary   Will get her swabbed. Self-quarantine until results are back. Treat with prednisone and tessalon. Call with any concerns or if getting worse.    Relevant Orders   Novel Coronavirus, NAA ([facility])       Follow up plan: No follow-ups on file.   . This visit was completed via MyChart due to the restrictions of the COVID-19 pandemic. All issues as above were discussed and addressed. Physical exam was done as above through visual confirmation on MyChart. If it was felt that the patient should be evaluated in the office, they were directed there. The patient verbally consented to this visit. . Location of the patient: home . Location of the provider: home . Those involved with this call:  . Provider: Olevia Perches, DO . CMA: Wilhemena Durie, CMA . Front Desk/Registration: Harriet Pho  . Time spent on call: 15 minutes with patient face to face via video conference. More than 50% of this time was spent in counseling and coordination of care. 23 minutes total spent in review of patient's record and preparation of their chart.

## 2020-12-26 ENCOUNTER — Emergency Department
Admission: EM | Admit: 2020-12-26 | Discharge: 2020-12-26 | Disposition: A | Discharge status: Home / Self Care | Payer: BC Managed Care – PPO | Attending: Emergency Medicine | Admitting: Emergency Medicine

## 2020-12-26 ENCOUNTER — Other Ambulatory Visit: Payer: Self-pay

## 2020-12-26 ENCOUNTER — Encounter: Payer: Self-pay | Admitting: Emergency Medicine

## 2020-12-26 DIAGNOSIS — Z79899 Other long term (current) drug therapy: Secondary | ICD-10-CM | POA: Insufficient documentation

## 2020-12-26 DIAGNOSIS — I10 Essential (primary) hypertension: Secondary | ICD-10-CM | POA: Diagnosis not present

## 2020-12-26 DIAGNOSIS — U071 COVID-19: Secondary | ICD-10-CM | POA: Diagnosis not present

## 2020-12-26 DIAGNOSIS — R059 Cough, unspecified: Secondary | ICD-10-CM | POA: Diagnosis present

## 2020-12-26 NOTE — Discharge Instructions (Addendum)
Remain out of work until you are fever free for >48 hours and at least 5 days after diagnosis  Follow work guidelines  Take tylenol for fever, pain  Drink plenty of fluids  Return with any worsening SOB

## 2020-12-26 NOTE — ED Triage Notes (Signed)
Patient presents to the ED with fever, body aches, congestion since Saturday.  Patient states she took an at home [phone] test that came back positive.  Patient states her work told her she needed to come to the ED to get documentation that she has covid19.  Patient states she has been feeling very weak and tired.  Patient states she was having trouble peeling a banana this morning.

## 2020-12-26 NOTE — ED Provider Notes (Signed)
[facility] Emergency Department Provider Note  ____________________________________________   None    (approximate)  I have reviewed the triage vital signs and the nursing notes.   HISTORY  Chief Complaint Fever, Nasal Congestion, and Generalized Body Aches    HPI Regina Chandler is a 30 y.o. female  With h/o bipolar d/o, htn, here with fever chills. Pt sx began 2-3 days ago with fever, cough, congestion. Reports she took a home COVID test and it was positive. She's since had ongoing fever, myalgias, fatigue. Mild nausea, no vomiting. No diarrhea. NO known sick contacts. Denies any SOB. Mild cough that is occasionally productive. No CP. No h/o lung issues. No h/o prior hospitalizations for lung issues. She did receive 2 vaccinations. No specific alleviating factors. Sx worse w/ exertion, worse at night.        Past Medical History:  Diagnosis Date  . Anemia   . Anxiety   . Bipolar 1 disorder (HCC)   . Depression   . GERD (gastroesophageal reflux disease)    NO MEDS  . Headache   . Hepatitis B affecting pregnancy 2007   No current treatment required, titers show up negative  . Hypertension     Patient Active Problem List   Diagnosis Date Noted  . Sinus pressure 10/18/2020  . Bacterial vaginosis 09/20/2020  . Numbness and tingling in both hands 09/20/2020  . BMI 50.0-59.9, adult (HCC) 11/23/2019  . Essential hypertension   . Bilateral leg edema 09/07/2018  . Obesity 09/07/2018  . Chlamydia infection affecting pregnancy 04/23/2018  . Elevated blood pressure affecting pregnancy in third trimester, antepartum 04/03/2018  . Back pain affecting pregnancy in second trimester 12/19/2017  . Chronic hepatitis B affecting antepartum care of mother [facility]) 12/19/2017  . BMI 45.0-49.9, adult (HCC) 12/19/2017  . Headache in pregnancy, antepartum, third trimester 11/24/2016    Past Surgical History:  Procedure Laterality Date  . LAPAROSCOPIC TUBAL  LIGATION N/A 07/30/2018   Procedure: LAPAROSCOPIC TUBAL LIGATION;  Surgeon: Vena Austria, MD;  Location: [facility] ORS;  Service: Gynecology;  Laterality: N/A;  . NO PAST SURGERIES    . NO PAST SURGERIES    . TUBAL LIGATION  07/30/2018   [facility])    Prior to Admission medications   Medication Sig Start Date End Date Taking? Authorizing Provider  acetaminophen (TYLENOL) 500 MG tablet Take 1 tablet (500 mg total) by mouth every 6 (six) hours as needed. 03/06/20   Enid Derry, PA-C  albuterol (VENTOLIN HFA) 108 (90 Base) MCG/ACT inhaler Inhale 2 puffs into the lungs every 6 (six) hours as needed for wheezing or shortness of breath. 08/09/20   Orvil Feil, PA-C  aluminum chloride (DRYSOL) 20 % external solution Apply topically at bedtime. 08/06/19   Particia Nearing, PA-C  benzonatate (TESSALON) 200 MG capsule Take 1 capsule (200 mg total) by mouth 2 (two) times daily as needed for cough. 12/25/20   Johnson, Megan P, DO  fluticasone (FLONASE) 50 MCG/ACT nasal spray Place 2 sprays into both nostrils daily. 10/18/20   Cannady, Corrie Dandy T, NP  hydrochlorothiazide (MICROZIDE) 12.5 MG capsule Take 1 capsule (12.5 mg total) by mouth daily. 10/06/20   Cannady, Corrie Dandy T, NP  Insulin Pen Needle (NOVOFINE) 32G X 6 MM MISC 1 application by Does not apply route daily. Patient not taking: No sig reported 12/21/19   Nadara Mustard, MD  ketoconazole (NIZORAL) 2 % cream APPLY TO AFFECTED AREA TWICE A DAY 12/17/19   Roosvelt Maser  Lanora Manis, PA-C  Liraglutide -Weight Management (SAXENDA) 18 MG/3ML SOPN Inject 3 mg into the skin daily. Patient not taking: No sig reported 12/21/19   Nadara Mustard, MD  lisinopril-hydrochlorothiazide (ZESTORETIC) 10-12.5 MG tablet Take 1 tablet by mouth daily. 09/19/20   Valentino Nose, NP  loratadine (CLARITIN) 10 MG tablet Take 1 tablet (10 mg total) by mouth daily. 10/18/20   Aura Dials T, NP  predniSONE (DELTASONE) 50 MG tablet Take 1 tablet (50 mg total)  by mouth daily with breakfast. 12/25/20   Dorcas Carrow, DO  SPRINTEC 28 0.25-35 MG-MCG tablet TAKE 1 TABLET BY MOUTH EVERY DAY 10/24/19   Particia Nearing, PA-C    Allergies Penicillins  Family History  Problem Relation Age of Onset  . Hypertension Sister   . Hypertension Brother   . Hypertension Maternal Grandmother   . Diabetes Maternal Grandmother   . Hypertension Maternal Grandfather   . Hypertension Paternal Grandmother   . Hypertension Paternal Grandfather   . Cancer Neg Hx   . Heart disease Neg Hx   . Stroke Neg Hx     Social History Social History   Tobacco Use  . Smoking status: Never Smoker  . Smokeless tobacco: Never Used  Vaping Use  . Vaping Use: Never used  Substance Use Topics  . Alcohol use: Not Currently    Comment: RARE  . Drug use: No    Review of Systems  Review of Systems  Constitutional: Positive for chills and fatigue. Negative for fever.  HENT: Negative for congestion and sore throat.   Eyes: Negative for visual disturbance.  Respiratory: Positive for cough. Negative for shortness of breath.   Cardiovascular: Negative for chest pain.  Gastrointestinal: Negative for abdominal pain, diarrhea, nausea and vomiting.  Genitourinary: Negative for flank pain.  Musculoskeletal: Negative for back pain and neck pain.  Skin: Negative for rash and wound.  Neurological: Positive for weakness.  All other systems reviewed and are negative.    ____________________________________________  PHYSICAL EXAM:      VITAL SIGNS: ED Triage Vitals  Enc Vitals Group     BP 12/26/20 1309 (!) 160/100     Pulse Rate 12/26/20 1309 77     Resp 12/26/20 1309 20     Temp 12/26/20 1309 98.9 F (37.2 C)     Temp Source 12/26/20 1309 Oral     SpO2 12/26/20 1309 100 %     Weight 12/26/20 1310 (!) 323 lb (146.5 kg)     Height 12/26/20 1310 5\' 5"  (1.651 m)     Head Circumference --      Peak Flow --      Pain Score 12/26/20 1310 10     Pain Loc --       Pain Edu? --      Excl. in GC? --      Physical Exam Vitals and nursing note reviewed.  Constitutional:      General: She is not in acute distress.    Appearance: She is well-developed.  HENT:     Head: Normocephalic and atraumatic.     Mouth/Throat:     Pharynx: Posterior oropharyngeal erythema present.     Comments: Mild posterior pharyngeal erythema w/o swelling or exudates Eyes:     Conjunctiva/sclera: Conjunctivae normal.  Cardiovascular:     Rate and Rhythm: Normal rate and regular rhythm.     Heart sounds: Normal heart sounds. No murmur heard. No friction rub.  Pulmonary:  Effort: Pulmonary effort is normal. No respiratory distress.     Breath sounds: Normal breath sounds. No wheezing or rales.  Abdominal:     General: There is no distension.     Palpations: Abdomen is soft.     Tenderness: There is no abdominal tenderness.  Musculoskeletal:     Cervical back: Neck supple.  Skin:    General: Skin is warm.     Capillary Refill: Capillary refill takes less than 2 seconds.  Neurological:     Mental Status: She is alert and oriented to person, place, and time.     Motor: No abnormal muscle tone.       ____________________________________________   LABS (all labs ordered are listed, but only abnormal results are displayed)  Labs Reviewed  SARS CORONAVIRUS 2 (TAT 6-24 HRS)    ____________________________________________  EKG:  ________________________________________  RADIOLOGY All imaging, including plain films, CT scans, and ultrasounds, independently reviewed by me, and interpretations confirmed via formal radiology reads.  ED MD interpretation:     Official radiology report(s): No results found.  ____________________________________________  PROCEDURES   Procedure(s) performed (including Critical Care):  Procedures  ____________________________________________  INITIAL IMPRESSION / MDM / ASSESSMENT AND PLAN / ED COURSE  As part of my  medical decision making, I reviewed the following data within the electronic MEDICAL RECORD NUMBER Nursing notes reviewed and incorporated, Old chart reviewed, Notes from prior ED visits, and  Controlled Substance Database       *MALY LEMARR was evaluated in Emergency Department on 12/26/2020 for the symptoms described in the history of present illness. She was evaluated in the context of the global COVID-19 pandemic, which necessitated consideration that the patient might be at risk for infection with the SARS-CoV-2 virus that causes COVID-19. Institutional protocols and algorithms that pertain to the evaluation of patients at risk for COVID-19 are in a state of rapid change based on information released by regulatory bodies including the CDC and federal and state organizations. These policies and algorithms were followed during the patient's care in the ED.  Some ED evaluations and interventions may be delayed as a result of limited staffing during the pandemic.*     Medical Decision Making:  30 yo F here with COVID symptoms of cough, SOB, fever, fatigue. Home COVID test is positive. Pt is well appearing, non toxic, in NAD here. Afebrile, satting >95% on RA. Normal WOB on exam. Will test here, d/c with outpt follow-up. No high risk features outside of weight. Nontoxic and in NAD.  ____________________________________________  FINAL CLINICAL IMPRESSION(S) / ED DIAGNOSES  Final diagnoses:  COVID-19     MEDICATIONS GIVEN DURING THIS VISIT:  Medications - No data to display   ED Discharge Orders    None       Note:  This document was prepared using Dragon voice recognition software and may include unintentional dictation errors.   Shaune Pollack, MD 12/26/20 [phone]

## 2020-12-27 ENCOUNTER — Telehealth: Payer: Self-pay

## 2020-12-27 LAB — SARS CORONAVIRUS 2 (TAT 6-24 HRS): SARS Coronavirus 2: POSITIVE — AB

## 2020-12-27 NOTE — Telephone Encounter (Signed)
Transition Care Management Unsuccessful Follow-up Telephone Call  Date of discharge and from where:  12/26/2020 [facility] ED  Attempts:  1st Attempt  Reason for unsuccessful TCM follow-up call:  Left voice message, call went straight to voicemail.

## 2020-12-28 ENCOUNTER — Other Ambulatory Visit (HOSPITAL_COMMUNITY): Payer: Self-pay | Admitting: Family

## 2020-12-28 ENCOUNTER — Telehealth (HOSPITAL_COMMUNITY): Payer: Self-pay

## 2020-12-28 ENCOUNTER — Telehealth: Payer: Self-pay

## 2020-12-28 DIAGNOSIS — U071 COVID-19: Secondary | ICD-10-CM

## 2020-12-28 NOTE — Telephone Encounter (Signed)
Transition Care Management Follow-up Telephone Call  Date of discharge and from where: 12/26/2020 [facility] ED  How have you been since you were released from the hospital? Recently diagnosed with COVID having some breathing issues, and very tired with moving around.   Advised patient of message in chart where infusion team attempted to contact her this morning for treatment.  Provided patient with number to call back. Advised her she may have to leave a message but someone will reach back out since she is already on their list.   Any questions or concerns? No  Items Reviewed:  Did the pt receive and understand the discharge instructions provided? Yes   Medications obtained and verified? No medicaitons ordered.  Other? No   Any new allergies since your discharge? No   Dietary orders reviewed? Yes  Do you have support at home? Yes   Functional Questionnaire: (I = Independent and D = Dependent) ADLs: I  Bathing/Dressing- I  Meal Prep- I  Eating- I  Maintaining continence- I  Transferring/Ambulation- I  Managing Meds- I  Follow up appointments reviewed:   PCP Hospital f/u appt confirmed? No    Specialist Hospital f/u appt confirmed? No    Are transportation arrangements needed? No   If their condition worsens, is the pt aware to call PCP or go to the Emergency Dept.? Yes  Was the patient provided with contact information for the PCP's office or ED? Yes  Was to pt encouraged to call back with questions or concerns? Yes

## 2020-12-28 NOTE — Progress Notes (Signed)
I connected by phone with Regina Chandler on 12/28/2020 at 6:36 PM to discuss the potential use of a new treatment for mild to moderate COVID-19 viral infection in non-hospitalized patients.  This patient is a 30 y.o. female that meets the FDA criteria for Emergency Use Authorization of COVID monoclonal antibody sotrovimab.  Has a (+) direct SARS-CoV-2 viral test result  Has mild or moderate COVID-19   Is NOT hospitalized due to COVID-19  Is within 10 days of symptom onset  Has at least one of the high risk factor(s) for progression to severe COVID-19 and/or hospitalization as defined in EUA.  Specific high risk criteria : BMI > 25 and Cardiovascular disease or hypertension   Symptoms began 12/23/2020   I have spoken and communicated the following to the patient or parent/caregiver regarding COVID monoclonal antibody treatment:  1. FDA has authorized the emergency use for the treatment of mild to moderate COVID-19 in adults and pediatric patients with positive results of direct SARS-CoV-2 viral testing who are 67 years of age and older weighing at least 40 kg, and who are at high risk for progressing to severe COVID-19 and/or hospitalization.  2. The significant known and potential risks and benefits of COVID monoclonal antibody, and the extent to which such potential risks and benefits are unknown.  3. Information on available alternative treatments and the risks and benefits of those alternatives, including clinical trials.  4. Patients treated with COVID monoclonal antibody should continue to self-isolate and use infection control measures (e.g., wear mask, isolate, social distance, avoid sharing personal items, clean and disinfect "high touch" surfaces, and frequent handwashing) according to CDC guidelines.   5. The patient or parent/caregiver has the option to accept or refuse COVID monoclonal antibody treatment.  After reviewing this information with the patient, the patient has  agreed to receive one of the available covid 19 monoclonal antibodies and will be provided an appropriate fact sheet prior to infusion. Morton Stall, NP 12/28/2020 6:36 PM

## 2020-12-28 NOTE — Telephone Encounter (Signed)
Called to discuss with patient about COVID-19 symptoms and the use of one of the available treatments for those with mild to moderate Covid symptoms and at a high risk of hospitalization.  Pt appears to qualify for outpatient treatment due to co-morbid conditions and/or a member of an at-risk group in accordance with the FDA Emergency Use Authorization.    Symptom onset: 12/23/20 per chart Vaccinated: Yes Booster? No Immunocompromised? No Qualifiers: HTN  Unable to reach pt - Left message and call back number [phone].   Regina Chandler

## 2020-12-28 NOTE — Telephone Encounter (Signed)
done

## 2020-12-29 ENCOUNTER — Ambulatory Visit (HOSPITAL_COMMUNITY): Payer: BC Managed Care – PPO

## 2020-12-30 ENCOUNTER — Ambulatory Visit (HOSPITAL_COMMUNITY): Payer: BC Managed Care – PPO

## 2021-01-02 ENCOUNTER — Encounter: Payer: Self-pay | Admitting: Family Medicine

## 2021-01-02 ENCOUNTER — Other Ambulatory Visit: Payer: Self-pay

## 2021-01-02 ENCOUNTER — Telehealth (INDEPENDENT_AMBULATORY_CARE_PROVIDER_SITE_OTHER): Payer: BC Managed Care – PPO | Admitting: Family Medicine

## 2021-01-02 VITALS — Temp 98.6°F

## 2021-01-02 DIAGNOSIS — U071 COVID-19: Secondary | ICD-10-CM

## 2021-01-02 NOTE — Patient Instructions (Signed)
It was great to see you!  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 - You may continue to take over the counter cough/old/sinus medication for your symptoms.  - I recommend getting your COVID booster vaccine once you are healed from your current infection and 5 months from your 2nd dose.   Take care and seek immediate care sooner if you develop any concerns.   Dr. Linwood Dibbles
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
## 2021-01-16 ENCOUNTER — Telehealth: Payer: Self-pay | Admitting: Family Medicine

## 2021-01-16 ENCOUNTER — Ambulatory Visit: Payer: BC Managed Care – PPO | Admitting: Family Medicine

## 2021-01-16 NOTE — Telephone Encounter (Signed)
Today was my 2nd attempt to reach Regina Chandler and get her scheduled for a phone visit with the Managed Medicaid Team. I left my contact info for her to call me back. I will reach out again in the next 7-14 days if I have not  Heard back from her.

## 2021-01-17 ENCOUNTER — Telehealth: Payer: Self-pay

## 2021-01-18 ENCOUNTER — Ambulatory Visit (INDEPENDENT_AMBULATORY_CARE_PROVIDER_SITE_OTHER): Payer: BC Managed Care – PPO | Admitting: Nurse Practitioner

## 2021-01-18 ENCOUNTER — Other Ambulatory Visit: Payer: Self-pay

## 2021-01-18 ENCOUNTER — Encounter: Payer: Self-pay | Admitting: Nurse Practitioner

## 2021-01-18 VITALS — BP 142/88 | HR 79 | Temp 98.3°F | Wt 325.6 lb

## 2021-01-18 DIAGNOSIS — N939 Abnormal uterine and vaginal bleeding, unspecified: Secondary | ICD-10-CM | POA: Diagnosis not present

## 2021-01-18 LAB — PREGNANCY, URINE: Preg Test, Ur: NEGATIVE

## 2021-01-18 LAB — CBC WITH DIFFERENTIAL/PLATELET
Hematocrit: 37.3 % (ref 34.0–46.6)
Hemoglobin: 12 g/dL (ref 11.1–15.9)
Lymphocytes Absolute: 2.5 10*3/uL (ref 0.7–3.1)
Lymphs: 36 %
MCH: 28.3 pg (ref 26.6–33.0)
MCHC: 32.2 g/dL (ref 31.5–35.7)
MCV: 88 fL (ref 79–97)
MID (Absolute): 0.6 10*3/uL (ref 0.1–1.6)
MID: 8 %
Neutrophils Absolute: 3.9 10*3/uL (ref 1.4–7.0)
Neutrophils: 56 %
Platelets: 271 10*3/uL (ref 150–450)
RBC: 4.24 x10E6/uL (ref 3.77–5.28)
RDW: 13.9 % (ref 11.7–15.4)
WBC: 7 10*3/uL (ref 3.4–10.8)

## 2021-01-18 NOTE — Progress Notes (Signed)
Established Patient Office Visit  Subjective:  Patient ID: Regina Chandler, female    DOB: 1991-06-29  Age: 30 y.o. MRN: [id number]  CC:  Chief Complaint  Patient presents with  . Menorrhagia    Pt states the bleeding started around 01/01/21, pt states she has some light and heavy days and states she has cramps that are associated with the bleeding. Pt states she gets nausea and complains of being really tired and fatigue. Pt states her iron is low. Pt states this is week 3 of bleeding.     HPI Regina Chandler presents for abnormal uterine bleeding   ABNORMAL MENSTRUAL PERIODS  Started period 01/01/21 and has been bleeding since then. Varies from light to heavy, uses a tampon and a pad. Changes tampon every 2 hours, however is not completely saturated. Notices that when she uses a tampon, she doesn't bleed as much, however if just a pad and walking then she bleeds more. This is associated with fatigue. [id number] Duration: weeks Average interval between menses: 28-31 days Length of menses: Normally 5 days Flow: heavy initially, then moderate Dysmenorrhea: yes Intermenstrual bleeding:no Postcoital bleeding: no Contraception: tubal ligation  Was taking sprintec, ran out early 2021 and hasn't taken it since Menarche at age: 54 Sexual activity: In a Monogamous Relationship History of sexually transmitted diseases: yes History GYN procedures: yes tubal ligation Abnormal pap smears: no   Dyspareunia: no Vaginal discharge:no Abdominal pain: no Galactorrhea: no Hirsuitism: no Frequent bruising/mucosal bleeding: no Double vision:no Hot flashes: no  Past Medical History:  Diagnosis Date  . Anemia   . Anxiety   . Bipolar 1 disorder (HCC)   . Depression   . GERD (gastroesophageal reflux disease)    NO MEDS  . Headache   . Hepatitis B affecting pregnancy 2007   No current treatment required, titers show up negative  . Hypertension     Past Surgical History:  Procedure  Laterality Date  . LAPAROSCOPIC TUBAL LIGATION N/A 07/30/2018   Procedure: LAPAROSCOPIC TUBAL LIGATION;  Surgeon: Vena Austria, MD;  Location: [facility] ORS;  Service: Gynecology;  Laterality: N/A;  . NO PAST SURGERIES    . NO PAST SURGERIES    . TUBAL LIGATION  07/30/2018   [facility])    Family History  Problem Relation Age of Onset  . Hypertension Sister   . Hypertension Brother   . Hypertension Maternal Grandmother   . Diabetes Maternal Grandmother   . Hypertension Maternal Grandfather   . Hypertension Paternal Grandmother   . Hypertension Paternal Grandfather   . Cancer Neg Hx   . Heart disease Neg Hx   . Stroke Neg Hx     Social History   Socioeconomic History  . Marital status: Single    Spouse name: Not on file  . Number of children: Not on file  . Years of education: Not on file  . Highest education level: Not on file  Occupational History  . Not on file  Tobacco Use  . Smoking status: Never Smoker  . Smokeless tobacco: Never Used  Vaping Use  . Vaping Use: Never used  Substance and Sexual Activity  . Alcohol use: Not Currently    Comment: RARE  . Drug use: No  . Sexual activity: Yes    Birth control/protection: Surgical    Comment: Tubal ligation  Other Topics Concern  . Not on file  Social History Narrative  . Not on file   Social Determinants of Health   Financial  Resource Strain: Not on file  Food Insecurity: Not on file  Transportation Needs: Not on file  Physical Activity: Not on file  Stress: Not on file  Social Connections: Not on file  Intimate Partner Violence: Not on file    Outpatient Medications Prior to Visit  Medication Sig Dispense Refill  . acetaminophen (TYLENOL) 500 MG tablet Take 1 tablet (500 mg total) by mouth every 6 (six) hours as needed. 30 tablet 0  . albuterol (VENTOLIN HFA) 108 (90 Base) MCG/ACT inhaler Inhale 2 puffs into the lungs every 6 (six) hours as needed for wheezing or shortness of breath. 8 g 2  .  aluminum chloride (DRYSOL) 20 % external solution Apply topically at bedtime. 35 mL 2  . fluticasone (FLONASE) 50 MCG/ACT nasal spray Place 2 sprays into both nostrils daily. 16 g 6  . ketoconazole (NIZORAL) 2 % cream APPLY TO AFFECTED AREA TWICE A DAY 60 g 2  . lisinopril-hydrochlorothiazide (ZESTORETIC) 10-12.5 MG tablet Take 1 tablet by mouth daily. 90 tablet 1  . loratadine (CLARITIN) 10 MG tablet Take 1 tablet (10 mg total) by mouth daily. 30 tablet 11  . benzonatate (TESSALON) 200 MG capsule Take 1 capsule (200 mg total) by mouth 2 (two) times daily as needed for cough. (Patient not taking: Reported on 01/18/2021) 20 capsule 0  . hydrochlorothiazide (MICROZIDE) 12.5 MG capsule Take 1 capsule (12.5 mg total) by mouth daily. (Patient not taking: Reported on 01/18/2021) 90 capsule 0  . predniSONE (DELTASONE) 50 MG tablet Take 1 tablet (50 mg total) by mouth daily with breakfast. (Patient not taking: Reported on 01/18/2021) 5 tablet 0  . SPRINTEC 28 0.25-35 MG-MCG tablet TAKE 1 TABLET BY MOUTH EVERY DAY (Patient not taking: Reported on 01/18/2021) 84 tablet 0   No facility-administered medications prior to visit.    Allergies  Allergen Reactions  . Penicillins Swelling    Has patient had a PCN reaction causing immediate rash, facial/tongue/throat swelling, SOB or lightheadedness with hypotension: No Has patient had a PCN reaction causing severe rash involving mucus membranes or skin necrosis: No Has patient had a PCN reaction that required hospitalization No Has patient had a PCN reaction occurring within the last 10 years: No If all of the above answers are "NO", then may proceed with Cephalosporin use.    ROS Review of Systems  Constitutional: Positive for fatigue. Negative for activity change and appetite change.  HENT: Negative.   Eyes: Negative.   Respiratory: Negative.   Cardiovascular: Negative.   Gastrointestinal: Positive for nausea. Negative for blood in stool, constipation and  diarrhea.  Endocrine: Negative.   Genitourinary: Positive for menstrual problem and vaginal bleeding. Negative for difficulty urinating and frequency.  Musculoskeletal: Negative.   Skin: Negative.   Neurological: Positive for light-headedness. Negative for dizziness, weakness and headaches.  Hematological: Negative.   Psychiatric/Behavioral: Negative.       Objective:    Physical Exam Vitals and nursing note reviewed.  Constitutional:      General: She is not in acute distress.    Appearance: Normal appearance.  HENT:     Head: Normocephalic and atraumatic.  Eyes:     Conjunctiva/sclera: Conjunctivae normal.  Cardiovascular:     Rate and Rhythm: Normal rate and regular rhythm.     Pulses: Normal pulses.     Heart sounds: Normal heart sounds.  Pulmonary:     Effort: Pulmonary effort is normal.     Breath sounds: Normal breath sounds.  Musculoskeletal:  Cervical back: Normal range of motion.  Skin:    General: Skin is warm and dry.  Neurological:     General: No focal deficit present.     Mental Status: She is alert and oriented to person, place, and time.  Psychiatric:        Mood and Affect: Mood normal.        Behavior: Behavior normal.     BP (!) 142/88   Pulse 79   Temp 98.3 F (36.8 C) (Oral)   Wt (!) 325 lb 9.6 oz (147.7 kg)   SpO2 97%   BMI 54.18 kg/m  Wt Readings from Last 3 Encounters:  01/18/21 (!) 325 lb 9.6 oz (147.7 kg)  12/26/20 (!) 323 lb (146.5 kg)  10/18/20 (!) 317 lb (143.8 kg)    Lab Results  Component Value Date   TSH 3.770 02/23/2019   Lab Results  Component Value Date   WBC 7.0 01/18/2021   HGB 12.0 01/18/2021   HCT 37.3 01/18/2021   MCV 88 01/18/2021   PLT 271 01/18/2021   Lab Results  Component Value Date   NA 139 02/23/2019   K 4.1 02/23/2019   CO2 26 02/23/2019   GLUCOSE 84 02/23/2019   BUN 9 02/23/2019   CREATININE 0.76 02/23/2019   BILITOT 0.5 02/23/2019   ALKPHOS 110 02/23/2019   AST 34 02/23/2019   ALT 24  02/23/2019   PROT 8.0 02/23/2019   ALBUMIN 4.5 02/23/2019   CALCIUM 9.1 02/23/2019   ANIONGAP 5 07/27/2018   Lab Results  Component Value Date   CHOL 140 02/23/2019   Lab Results  Component Value Date   HDL 45 02/23/2019   Lab Results  Component Value Date   LDLCALC 82 02/23/2019   Lab Results  Component Value Date   TRIG 64 02/23/2019   No results found for: CHOLHDL Lab Results  Component Value Date   HGBA1C 5.0 02/16/2018      Assessment & Plan:   Problem List Items Addressed This Visit      Genitourinary   Abnormal uterine bleeding - Primary    Has had menses for 13 days. Urine pregnancy negative. CBC checked in office which did not show anemia or low hemoglobin. She may take an OTC iron pill or prenatal vitamin while she is still bleeding to help with the fatigue. For now, we will monitor symptoms. If she does not stop bleeding by Monday or it worsens, will order pelvic ultrasound and consider a progesterone pill to stop the bleeding.       Relevant Orders   Pregnancy, urine (Completed)   CBC With Differential/Platelet (Completed)      No orders of the defined types were placed in this encounter.   Follow-up: No follow-ups on file.    Gerre Scull, NP

## 2021-01-18 NOTE — Assessment & Plan Note (Addendum)
Has had menses for 13 days. Urine pregnancy negative. CBC checked in office which did not show anemia or low hemoglobin. She may take an OTC iron pill or prenatal vitamin while she is still bleeding to help with the fatigue. For now, we will monitor symptoms. If she does not stop bleeding by Monday or it worsens, will order pelvic ultrasound and consider a progesterone pill to stop the bleeding.

## 2021-01-20 NOTE — Progress Notes (Addendum)
I saw and evaluated the above patient on 01/18/21.  The case was discussed on rounds with Lauren McElwee, DNP. I personally reviewed the HPI, PH, FH, SH, ROS and medications. I repeated pertinent portions of the examination and reviewed the relevant imaging and laboratory data. I agree with the findings, assessment and plan as documented.   

## 2021-01-22 DIAGNOSIS — G5603 Carpal tunnel syndrome, bilateral upper limbs: Secondary | ICD-10-CM | POA: Diagnosis not present

## 2021-01-26 ENCOUNTER — Other Ambulatory Visit (HOSPITAL_COMMUNITY)
Admission: RE | Admit: 2021-01-26 | Discharge: 2021-01-26 | Disposition: A | Discharge status: Home / Self Care | Payer: BC Managed Care – PPO | Source: Ambulatory Visit | Attending: Obstetrics & Gynecology | Admitting: Obstetrics & Gynecology

## 2021-01-26 ENCOUNTER — Ambulatory Visit (INDEPENDENT_AMBULATORY_CARE_PROVIDER_SITE_OTHER): Payer: BC Managed Care – PPO | Admitting: Obstetrics & Gynecology

## 2021-01-26 ENCOUNTER — Encounter: Payer: Self-pay | Admitting: Obstetrics & Gynecology

## 2021-01-26 ENCOUNTER — Other Ambulatory Visit: Payer: Self-pay

## 2021-01-26 VITALS — BP 140/80 | Ht 66.0 in | Wt 324.0 lb

## 2021-01-26 DIAGNOSIS — Z124 Encounter for screening for malignant neoplasm of cervix: Secondary | ICD-10-CM

## 2021-01-26 DIAGNOSIS — N921 Excessive and frequent menstruation with irregular cycle: Secondary | ICD-10-CM

## 2021-01-26 MED ORDER — MEDROXYPROGESTERONE ACETATE 10 MG PO TABS: 10.0000 mg | ORAL_TABLET | Freq: Every day | ORAL | 0 refills | Status: DC

## 2021-01-26 NOTE — Patient Instructions (Signed)
Menorrhagia Menorrhagia is when your monthly periods are heavy or last longer than normal. If you have this condition, bleeding and cramping may make it hard for you to do your daily activities. What are the causes? Common causes of this condition include:  Growths in the womb (uterus). These are polyps or fibroids. These growths are not cancer.  Problems with two hormones called estrogen and progesterone.  One of the ovaries not releasing an egg during one or more months.  A problem with the thyroid gland.  Having a device for birth control (IUD).  Side effects of some medicines, such as NSAIDs or blood thinners.  A disorder that stops the blood from clotting normally. What increases the risk? You are more likely to have this condition if you have cancer of the womb. What are the signs or symptoms?  Having to change your pad or tampon every 1-2 hours because it is soaked.  Needing to use pads and tampons at the same time because of heavy bleeding.  Needing to wake up to change your pads or tampons during the night.  Passing blood clots larger than 1 inch (2.5 cm) in size.  Having bleeding that lasts for more than 7 days.  Having symptoms of low iron levels (anemia), such as feeling tired or having shortness of breath. How is this treated? You may not need to be treated for this condition. But if you need treatment, you may be given medicines:  To reduce bleeding during your period. These include birth control medicines.  To make your blood thick. This slows bleeding.  To reduce swelling. Medicines that do this include ibuprofen.  That have a hormone called progestin.  That make the ovaries stop working for a short time.  To treat low iron levels. You will be given iron pills if you have this condition. If medicines do not work, surgery may be done. Surgery may be done to:  Remove a part of the lining of the womb. This lining is called the endometrium. This reduces  bleeding during a period.  Remove growths in the womb. These may be polyps or fibroids.  Remove the entire lining of the womb.  Remove the womb entirely. This procedure is called a hysterectomy.   Follow these instructions at home: Medicines  Take over-the-counter and prescription medicines only as told by your doctor. This includes iron pills.  Do not change or switch medicines without asking your doctor.  Do not take aspirin or medicines that contain aspirin 1 week before or during your period. Aspirin may make bleeding worse. Managing constipation Iron pills may cause trouble pooping (constipation). To prevent or treat problems when pooping, you may need to:  Drink enough fluid to keep your pee (urine) pale yellow.  Take over-the-counter or prescription medicines.  Eat foods that are high in fiber. These include beans, whole grains, and fresh fruits and vegetables.  Limit foods that are high in fat and sugar. These include fried or sweet foods. General instructions  If you need to change your pad or tampon more than once every 2 hours, limit your activity until the bleeding stops.  Eat healthy meals and foods that are high in iron. Foods that have a lot of iron include: ? Leafy green vegetables. ? Meat. ? Liver. ? Eggs. ? Whole-grain breads and cereals.  Do not try to lose weight until your heavy bleeding has stopped and you have normal amounts of iron in your blood. If you need to lose   weight, work with your doctor.  Keep all follow-up visits. Contact a doctor if:  You soak through a pad or tampon every 1 or 2 hours, and this happens every time you have a period.  You need to use pads and tampons at the same time because you are bleeding so much.  You are taking medicine, and: ? You feel like you may vomit. ? You vomit. ? You have watery poop (diarrhea).  You have other problems that may be related to the medicine you are taking. Get help right away if:  You  soak through more than a pad or tampon in 1 hour.  You pass clots bigger than 1 inch (2.5 cm) wide.  You feel short of breath.  You feel like your heart is beating too fast.  You feel dizzy or you faint.  You feel very weak or tired. Summary  Menorrhagia is when your menstrual periods are heavy or last longer than normal.  You may not need to be treated for this condition. If you need treatment, you may be given medicines or have surgery.  Take over-the-counter and prescription medicines only as told by your doctor. This includes iron pills.  Get help right away if you soak through more than a pad or tampon in 1 hour or you pass large clots. Also, get help right away if you feel dizzy, short of breath, or very weak or tired. This information is not intended to replace advice given to you by your health care provider. Make sure you discuss any questions you have with your health care provider. Document Revised: 08/08/2020 Document Reviewed: 08/08/2020 Elsevier Patient Education  2021 [employer] Inc[employer]  

## 2021-01-26 NOTE — Progress Notes (Signed)
HPI:      [name] is a 30 y.o. [id number] who LMP was Patient's last menstrual period was 01/01/2021., presents today for a problem visit.  She complains of menometrorrhagia that  began several weeks ago and its severity is described as severe.  She has regular periods every 28 days and they are associated with mild menstrual cramping; until Dec- Jan where she had no period, then current bleeding started 01/01/21 til now.  She has used the following for attempts at control: tampon and pad.  Previous evaluation: none. Prior Diagnosis: none, prior reg cyctse, 4 prior pregnancies, prior BTL. Previous Treatment: none.  She is single partner, contraception - tubal ligation.  Hx of STDs: none. She is premenopausal.  PMHx: She  has a past medical history of Anemia, Anxiety, Bipolar 1 disorder (HCC), Depression, GERD (gastroesophageal reflux disease), Headache, Hepatitis B affecting pregnancy (2007), and Hypertension. Also,  has a past surgical history that includes No past surgeries; No past surgeries; Laparoscopic tubal ligation (N/A, 07/30/2018); and Tubal ligation (07/30/2018)., family history includes Diabetes in her maternal grandmother; Hypertension in her brother, maternal grandfather, maternal grandmother, paternal grandfather, paternal grandmother, and sister.,  reports that she has never smoked. She has never used smokeless tobacco. She reports previous alcohol use. She reports that she does not use drugs.  She  Current Outpatient Medications:  .  acetaminophen (TYLENOL) 500 MG tablet, Take 1 tablet (500 mg total) by mouth every 6 (six) hours as needed., Disp: 30 tablet, Rfl: 0 .  albuterol (VENTOLIN HFA) 108 (90 Base) MCG/ACT inhaler, Inhale 2 puffs into the lungs every 6 (six) hours as needed for wheezing or shortness of breath., Disp: 8 g, Rfl: 2 .  aluminum chloride (DRYSOL) 20 % external solution, Apply topically at bedtime., Disp: 35 mL, Rfl: 2 .  fluticasone (FLONASE) 50  MCG/ACT nasal spray, Place 2 sprays into both nostrils daily., Disp: 16 g, Rfl: 6 .  hydrochlorothiazide (MICROZIDE) 12.5 MG capsule, Take 1 capsule (12.5 mg total) by mouth daily., Disp: 90 capsule, Rfl: 0 .  ketoconazole (NIZORAL) 2 % cream, APPLY TO AFFECTED AREA TWICE A DAY, Disp: 60 g, Rfl: 2 .  lisinopril-hydrochlorothiazide (ZESTORETIC) 10-12.5 MG tablet, Take 1 tablet by mouth daily., Disp: 90 tablet, Rfl: 1 .  loratadine (CLARITIN) 10 MG tablet, Take 1 tablet (10 mg total) by mouth daily., Disp: 30 tablet, Rfl: 11 .  medroxyPROGESTERone (PROVERA) 10 MG tablet, Take 1 tablet (10 mg total) by mouth daily for 10 days., Disp: 10 tablet, Rfl: 0 .  predniSONE (DELTASONE) 50 MG tablet, Take 1 tablet (50 mg total) by mouth daily with breakfast., Disp: 5 tablet, Rfl: 0 .  benzonatate (TESSALON) 200 MG capsule, Take 1 capsule (200 mg total) by mouth 2 (two) times daily as needed for cough. (Patient not taking: Reported on 01/18/2021), Disp: 20 capsule, Rfl: 0  Also, is allergic to penicillins.  Review of Systems  Constitutional: Negative for chills, fever and malaise/fatigue.  HENT: Negative for congestion, sinus pain and sore throat.   Eyes: Negative for blurred vision and pain.  Respiratory: Negative for cough and wheezing.   Cardiovascular: Negative for chest pain and leg swelling.  Gastrointestinal: Negative for abdominal pain, constipation, diarrhea, heartburn, nausea and vomiting.  Genitourinary: Negative for dysuria, frequency, hematuria and urgency.  Musculoskeletal: Negative for back pain, joint pain, myalgias and neck pain.  Skin: Negative for itching and rash.  Neurological: Negative for dizziness, tremors and weakness.  Endo/Heme/Allergies: Does not bruise/bleed  easily.  Psychiatric/Behavioral: Negative for depression. The patient is not nervous/anxious and does not have insomnia.     Objective: BP 140/80   Ht 5\' 6"  (1.676 m)   Wt (!) 324 lb (147 kg)   LMP 01/01/2021   BMI  52.29 kg/m  Physical Exam Constitutional:      General: She is not in acute distress.    Appearance: She is well-developed. She is obese.  Genitourinary:     Uterus normal.     Vaginal bleeding present.     No vaginal erythema.      Right Adnexa: not tender and no mass present.    Left Adnexa: not tender and no mass present.    No cervical motion tenderness, discharge, polyp or nabothian cyst.     Uterus is mobile.     Uterus is not enlarged.     No uterine mass detected.    Uterus is midaxial.     Pelvic exam was performed with patient in the lithotomy position.  HENT:     Head: Normocephalic and atraumatic.     Nose: Nose normal.  Abdominal:     General: There is no distension.     Palpations: Abdomen is soft.     Tenderness: There is no abdominal tenderness.  Musculoskeletal:        General: Normal range of motion.  Neurological:     Mental Status: She is alert and oriented to person, place, and time.     Cranial Nerves: No cranial nerve deficit.  Skin:    General: Skin is warm and dry.  Psychiatric:        Attention and Perception: Attention normal.        Mood and Affect: Mood and affect normal.        Speech: Speech normal.        Behavior: Behavior normal.        Thought Content: Thought content normal.        Judgment: Judgment normal.     ASSESSMENT/PLAN:  menometrorrhagia  Problem List Items Addressed This Visit    Visit Diagnoses    Menometrorrhagia    -  Primary   Relevant Orders   01/03/2021 PELVIC COMPLETE WITH TRANSVAGINAL Provera for 10 days    Screening for cervical cancer       Relevant Orders   Cytology - PAP    Patient has abnormal uterine bleeding . She has a normal exam today, with no evidence of lesions.  Evaluation includes the following: exam, labs such as hormonal testing, and pelvic ultrasound to evaluate for any structural gynecologic abnormalities.  Patient to follow up after testing.  Treatment option for menorrhagia or menometrorrhagia  discussed in great detail with the patient.  Options include hormonal therapy, IUD therapy such as Mirena, D&C, Ablation, and Hysterectomy.  The pros and cons of each option discussed with patient.   [place], MD, [facility], [facility] 01/26/2021  2:11 PM

## 2021-01-30 LAB — CYTOLOGY - PAP
Adequacy: ABSENT
Diagnosis: NEGATIVE

## 2021-02-05 ENCOUNTER — Encounter: Payer: Self-pay | Admitting: Family Medicine

## 2021-02-13 ENCOUNTER — Other Ambulatory Visit: Payer: Self-pay | Admitting: Obstetrics & Gynecology

## 2021-02-13 ENCOUNTER — Ambulatory Visit (INDEPENDENT_AMBULATORY_CARE_PROVIDER_SITE_OTHER): Payer: BC Managed Care – PPO | Admitting: Obstetrics & Gynecology

## 2021-02-13 ENCOUNTER — Ambulatory Visit (INDEPENDENT_AMBULATORY_CARE_PROVIDER_SITE_OTHER): Payer: BC Managed Care – PPO

## 2021-02-13 ENCOUNTER — Encounter: Payer: Self-pay | Admitting: Obstetrics & Gynecology

## 2021-02-13 ENCOUNTER — Other Ambulatory Visit: Payer: Self-pay

## 2021-02-13 VITALS — BP 130/90 | Ht 66.0 in | Wt 324.0 lb

## 2021-02-13 DIAGNOSIS — N921 Excessive and frequent menstruation with irregular cycle: Secondary | ICD-10-CM | POA: Diagnosis not present

## 2021-02-13 DIAGNOSIS — Z6841 Body Mass Index (BMI) 40.0 and over, adult: Secondary | ICD-10-CM

## 2021-02-13 MED ORDER — NORETHINDRONE 0.35 MG PO TABS: 1.0000 | ORAL_TABLET | Freq: Every day | ORAL | 11 refills | Status: DC

## 2021-02-13 MED ORDER — PHENTERMINE HCL 37.5 MG PO TABS: ORAL_TABLET | ORAL | 0 refills | Status: DC

## 2021-02-13 NOTE — Progress Notes (Signed)
°  HPI: Pt has been having prolonged bleeding w periods, irreg too.  Provera helped for a few days, then bleeding resumed after she finished it.  Pt also describes difficulty in weight loss.  Metabolism low.  Trying new diet.  Ultrasound demonstrates no masses seen, perhaps adenomyosis  PMHx: She  has a past medical history of Anemia, Anxiety, Bipolar 1 disorder (HCC), Depression, GERD (gastroesophageal reflux disease), Headache, Hepatitis B affecting pregnancy (2007), and Hypertension. Also,  has a past surgical history that includes No past surgeries; No past surgeries; Laparoscopic tubal ligation (N/A, 07/30/2018); and Tubal ligation (07/30/2018)., family history includes Diabetes in her maternal grandmother; Hypertension in her brother, maternal grandfather, maternal grandmother, paternal grandfather, paternal grandmother, and sister.,  reports that she has never smoked. She has never used smokeless tobacco. She reports previous alcohol use. She reports that she does not use drugs.  She has a current medication list which includes the following prescription(s): acetaminophen, albuterol, drysol, fluticasone, hydrochlorothiazide, ketoconazole, lisinopril-hydrochlorothiazide, loratadine, norethindrone, phentermine, and prednisone. Also, is allergic to penicillins.  Review of Systems  All other systems reviewed and are negative.   Objective: BP 130/90    Ht 5\' 6"  (1.676 m)    Wt (!) 324 lb (147 kg)    LMP 01/05/2021    BMI 52.29 kg/m   Physical examination Constitutional NAD, Conversant  Skin No rashes, lesions or ulceration.   Extremities: Moves all appropriately.  Normal ROM for age. No lymphadenopathy.  Neuro: Grossly intact  Psych: Oriented to PPT.  Normal mood. Normal affect.   01/07/2021 PELVIS TRANSVAGINAL NON-OB (TV ONLY)  Result Date: 02/13/2021 Patient Name: Regina Chandler DOB: 1991-03-25 MRN: [id number] ULTRASOUND REPORT Location: [facility] Date of Service: 02/13/2021  Indications:Abnormal Uterine Bleeding Findings: The uterus is anteverted and measures 10.3 x 6.4 x 5.6cm. Echo texture is homogenous. Bulky fundus without evidence of focal masses. The Endometrium measures 8.1 mm. Right Ovary measures 3.4 x 2.0 x 2.2 cm. It is normal in appearance. Left Ovary measures 2.1 x 1.5 x 1.5 cm. It is normal in appearance. Survey of the adnexa demonstrates no adnexal masses. There is no free fluid in the cul de sac. Impression: 1. Normal pelvic ultrasound. Recommendations: 1.Clinical correlation with the patient's History and Physical Exam. 04/15/2021, RT Review of ULTRASOUND.    I have personally reviewed images and report of recent ultrasound done at [facility].    Plan of management to be discussed with patient. [facility], MD, FACOG [facility], [facility] 02/13/2021  10:55 AM    Assessment:  Menometrorrhagia - Plan: norethindrone (MICRONOR) 0.35 MG tablet  Class 3 severe obesity due to excess calories without serious comorbidity with body mass index (BMI) of 50.0 to 59.9 in adult [facility]) - Plan: phentermine (ADIPEX-P) 37.5 MG tablet   Will start medicine for weight loss, and follow up to assess results, BP response, and side effects  Will start Micronor daily for hormone control of periods, safe medicine as compared to estrogen meds based on h/o HTN.  Alternatives of hysterectomy discussed as well.  A total of 20 minutes were spent face-to-face with the patient as well as preparation, review, communication, and documentation during this encounter.   [facility], MD, Annam[facility] Major Ob/Gyn, [facility] 02/13/2021  11:11 AM

## 2021-02-13 NOTE — Patient Instructions (Signed)
Phentermine tablets or capsules What is this medicine? PHENTERMINE (FEN ter meen) decreases your appetite. It is used with a reduced calorie diet and exercise to help you lose weight. This medicine may be used for other purposes; ask your health care provider or pharmacist if you have questions. COMMON BRAND NAME(S): Adipex-P, Atti-Plex P, Atti-Plex P Spansule, Fastin, Lomaira, Pro-Fast, Tara-8 What should I tell my health care provider before I take this medicine? They need to know if you have any of these conditions:  agitation or nervousness  diabetes  glaucoma  heart disease  high blood pressure  history of drug abuse or addiction  history of stroke  kidney disease  lung disease called Primary Pulmonary Hypertension (PPH)  taken an MAOI like Carbex, Eldepryl, Marplan, Nardil, or Parnate in last 14 days  taking stimulant medicines for attention disorders, weight loss, or to stay awake  thyroid disease  an unusual or allergic reaction to phentermine, other medicines, foods, dyes, or preservatives  pregnant or trying to get pregnant  breast-feeding How should I use this medicine? Take this medicine by mouth with a glass of water. Follow the directions on the prescription label. Take your medicine at regular intervals. Do not take it more often than directed. Do not stop taking except on your doctor's advice. Talk to your pediatrician regarding the use of this medicine in children. While this drug may be prescribed for children 17 years or older for selected conditions, precautions do apply. Overdosage: If you think you have taken too much of this medicine contact a poison control center or emergency room at once. NOTE: This medicine is only for you. Do not share this medicine with others. What if I miss a dose? If you miss a dose, take it as soon as you can. If it is almost time for your next dose, take only that dose. Do not take double or extra doses. What may interact  with this medicine? Do not take this medicine with any of the following medications:  MAOIs like Carbex, Eldepryl, Marplan, Nardil, and Parnate This medicine may also interact with the following medications:  alcohol  certain medicines for depression, anxiety, or psychotic disorders  certain medicines for high blood pressure  linezolid  medicines for colds or breathing difficulties like pseudoephedrine or phenylephrine  medicines for diabetes  sibutramine  stimulant medicines for attention disorders, weight loss, or to stay awake This list may not describe all possible interactions. Give your health care provider a list of all the medicines, herbs, non-prescription drugs, or dietary supplements you use. Also tell them if you smoke, drink alcohol, or use illegal drugs. Some items may interact with your medicine. What should I watch for while using this medicine? Visit your doctor or health care provider for regular checks on your progress. Do not stop taking except on your health care provider's advice. You may develop a severe reaction. Your health care provider will tell you how much medicine to take. Do not take this medicine close to bedtime. It may prevent you from sleeping. You may get drowsy or dizzy. Do not drive, use machinery, or do anything that needs mental alertness until you know how this medicine affects you. Do not stand or sit up quickly, especially if you are an older patient. This reduces the risk of dizzy or fainting spells. Alcohol may increase dizziness and drowsiness. Avoid alcoholic drinks. This medicine may affect blood sugar levels. Ask your healthcare provider if changes in diet or medicines are needed   if you have diabetes. Women should inform their health care provider if they wish to become pregnant or think they might be pregnant. Losing weight while pregnant is not advised and may cause harm to the unborn child. Talk to your health care provider for more  information. What side effects may I notice from receiving this medicine? Side effects that you should report to your doctor or health care professional as soon as possible:  allergic reactions like skin rash, itching or hives, swelling of the face, lips, or tongue  breathing problems  changes in emotions or moods  changes in vision  chest pain or chest tightness  fast, irregular heartbeat  feeling faint or lightheaded  increased blood pressure  irritable  restlessness  tremors  seizures  signs and symptoms of a stroke like changes in vision; confusion; trouble speaking or understanding; severe headaches; sudden numbness or weakness of the face, arm or leg; trouble walking; dizziness; loss of balance or coordination  unusually weak or tired Side effects that usually do not require medical attention (report to your doctor or health care professional if they continue or are bothersome):  changes in taste  constipation or diarrhea  dizziness  dry mouth  headache  trouble sleeping  upset stomach This list may not describe all possible side effects. Call your doctor for medical advice about side effects. You may report side effects to FDA at 1-800-FDA-1088. Where should I keep my medicine? Keep out of the reach of children. This medicine can be abused. Keep your medicine in a safe place to protect it from theft. Do not share this medicine with anyone. Selling or giving away this medicine is dangerous and against the law. This medicine may cause harm and death if it is taken by other adults, children, or pets. Return medicine that has not been used to an official disposal site. Contact the DEA at 878-597-6009 or your city/county government to find a site. If you cannot return the medicine, mix any unused medicine with a substance like cat litter or coffee grounds. Then throw the medicine away in a sealed container like a sealed bag or coffee can with a lid. Do not use the  medicine after the expiration date. Store at room temperature between 20 and 25 degrees C (68 and 77 degrees F). Keep container tightly closed. NOTE: This sheet is a summary. It may not cover all possible information. If you have questions about this medicine, talk to your doctor, pharmacist, or health care provider.  2021 Elsevier/Gold Standard (2019-10-01 12:54:20)  Norethindrone acetate (hormone replacement) What is this medicine? NORETHINDRONE ACETATE (nor eth IN drone AS e tate) is a female hormone. This medicine is used to treat endometriosis, uterine bleeding caused by abnormal hormone levels, and secondary amenorrhea. Secondary amenorrhea is when a woman stops getting menstrual periods due to low levels of certain female hormones. This medicine may be used for other purposes; ask your health care provider or pharmacist if you have questions. COMMON BRAND NAME(S): Aygestin What should I tell my health care provider before I take this medicine? They need to know if you have any of these conditions:  blood vessel disease or blood clots  breast, cervical, or vaginal cancer  diabetes  heart disease  kidney disease  liver disease  mental depression  migraine  seizures  stroke  vaginal bleeding  an unusual or allergic reaction to norethindrone, other medicines, foods, dyes, or preservatives  pregnant or trying to get pregnant  breast-feeding How  should I use this medicine? Take this medicine by mouth with a glass of water. You may take this medicine with or without food. Follow the directions on the prescription label. Take this medicine at the same time each day. Do not take your medicine more often than directed. A patient information sheet will be given with each prescription and refill. Read this sheet carefully each time. The sheet may change frequently. Talk to your pediatrician regarding the use of this medicine in children. Special care may be needed. Overdosage:  If you think you have taken too much of this medicine contact a poison control center or emergency room at once. NOTE: This medicine is only for you. Do not share this medicine with others. What if I miss a dose? If you miss a dose, take it as soon as you can. If it is almost time for your next dose, take only that dose. Do not take double or extra doses. What may interact with this medicine? Do not take this medicine with any of the following medications:  amprenavir or fosamprenavir  bosentan This medicine may also interact with the following medications:  antibiotics or medicines for infections, especially rifampin, rifabutin, rifapentine, and griseofulvin, and possibly penicillins or tetracyclines  aprepitant  barbiturate medicines, such as phenobarbital  carbamazepine  felbamate  modafinil  oxcarbazepine  phenytoin  ritonavir or other medicines for HIV infection or AIDS  St. John's wort  topiramate This list may not describe all possible interactions. Give your health care provider a list of all the medicines, herbs, non-prescription drugs, or dietary supplements you use. Also tell them if you smoke, drink alcohol, or use illegal drugs. Some items may interact with your medicine. What should I watch for while using this medicine? Visit your doctor or health care professional for regular checks on your progress. You will need a regular breast and pelvic exam and Pap smear while on this medicine. If you have any reason to think you are pregnant, stop taking this medicine right away and contact your doctor or health care professional. If you are taking this medicine for hormone related problems, it may take several cycles of use to see improvement in your condition. What side effects may I notice from receiving this medicine? Side effects that you should report to your doctor or health care professional as soon as possible:  breast tenderness or discharge  pain in the  abdomen, chest, groin or leg  severe headache  skin rash, itching, or hives  sudden shortness of breath  unusually weak or tired  vision or speech problems  yellowing of skin or eyes Side effects that usually do not require medical attention (report to your doctor or health care professional if they continue or are bothersome):  changes in sexual desire  change in menstrual flow  facial hair growth  fluid retention and swelling  headache  irritability  nausea  weight gain or loss This list may not describe all possible side effects. Call your doctor for medical advice about side effects. You may report side effects to FDA at 1-800-FDA-1088. Where should I keep my medicine? Keep out of the reach of children. Store at room temperature between 15 and 30 degrees C (59 and 86 degrees F). Throw away any unused medicine after the expiration date. NOTE: This sheet is a summary. It may not cover all possible information. If you have questions about this medicine, talk to your doctor, pharmacist, or health care provider.  2021 Elsevier/Gold Standard (  2008-06-20 14:38:36)   

## 2021-02-22 ENCOUNTER — Other Ambulatory Visit: Payer: Self-pay | Admitting: Obstetrics & Gynecology

## 2021-03-08 DIAGNOSIS — G5603 Carpal tunnel syndrome, bilateral upper limbs: Secondary | ICD-10-CM | POA: Diagnosis not present

## 2021-03-13 ENCOUNTER — Ambulatory Visit (INDEPENDENT_AMBULATORY_CARE_PROVIDER_SITE_OTHER): Payer: BC Managed Care – PPO | Admitting: Obstetrics & Gynecology

## 2021-03-13 ENCOUNTER — Other Ambulatory Visit: Payer: Self-pay

## 2021-03-13 ENCOUNTER — Encounter: Payer: Self-pay | Admitting: Obstetrics & Gynecology

## 2021-03-13 VITALS — BP 140/98 | Ht 66.0 in | Wt 324.0 lb

## 2021-03-13 DIAGNOSIS — Z6841 Body Mass Index (BMI) 40.0 and over, adult: Secondary | ICD-10-CM

## 2021-03-13 DIAGNOSIS — N921 Excessive and frequent menstruation with irregular cycle: Secondary | ICD-10-CM | POA: Diagnosis not present

## 2021-03-13 NOTE — Progress Notes (Signed)
  History of Present Illness:  Regina Chandler is a 30 y.o. who was started on Micronor for abnormal bleeding approximately 4 weeks ago. Since that time, she states that her symptoms are improving.  No bleeding today.  Also started ojn Phentermine for obesity.  Recent stress of father dying.  Dry mouth side effect, no others.  No weight loss documented this past month.  Denies sx's of HTN.  PMHx: She  has a past medical history of Anemia, Anxiety, Bipolar 1 disorder (HCC), Depression, GERD (gastroesophageal reflux disease), Headache, Hepatitis B affecting pregnancy (2007), and Hypertension. Also,  has a past surgical history that includes No past surgeries; No past surgeries; Laparoscopic tubal ligation (N/A, 07/30/2018); and Tubal ligation (07/30/2018)., family history includes Diabetes in her maternal grandmother; Hypertension in her brother, maternal grandfather, maternal grandmother, paternal grandfather, paternal grandmother, and sister.,  reports that she has never smoked. She has never used smokeless tobacco. She reports previous alcohol use. She reports that she does not use drugs. Current Meds  Medication Sig  . acetaminophen (TYLENOL) 500 MG tablet Take 1 tablet (500 mg total) by mouth every 6 (six) hours as needed.  Marland Kitchen albuterol (VENTOLIN HFA) 108 (90 Base) MCG/ACT inhaler Inhale 2 puffs into the lungs every 6 (six) hours as needed for wheezing or shortness of breath.  Marland Kitchen aluminum chloride (DRYSOL) 20 % external solution Apply topically at bedtime.  . fluticasone (FLONASE) 50 MCG/ACT nasal spray Place 2 sprays into both nostrils daily.  . hydrochlorothiazide (MICROZIDE) 12.5 MG capsule Take 1 capsule (12.5 mg total) by mouth daily.  Marland Kitchen ketoconazole (NIZORAL) 2 % cream APPLY TO AFFECTED AREA TWICE A DAY  . lisinopril-hydrochlorothiazide (ZESTORETIC) 10-12.5 MG tablet Take 1 tablet by mouth daily.  Marland Kitchen loratadine (CLARITIN) 10 MG tablet Take 1 tablet (10 mg total) by mouth daily.  .  norethindrone (MICRONOR) 0.35 MG tablet Take 1 tablet (0.35 mg total) by mouth daily.  . phentermine (ADIPEX-P) 37.5 MG tablet One tablet po in morning.  . predniSONE (DELTASONE) 50 MG tablet Take 1 tablet (50 mg total) by mouth daily with breakfast.  . Also, is allergic to penicillins..  Review of Systems  All other systems reviewed and are negative.   Physical Exam:  BP (!) 140/98   Ht 5\' 6"  (1.676 m)   Wt (!) 324 lb (147 kg)   LMP 12/13/2020   BMI 52.29 kg/m  Body mass index is 52.29 kg/m. Constitutional: Well nourished, well developed female in no acute distress.  Abdomen: diffusely non tender to palpation, non distended, and no masses, hernias Neuro: Grossly intact Psych:  Normal mood and affect.    Assessment:  Problem List Items Addressed This Visit      Other   Obesity    Other Visit Diagnoses    Menometrorrhagia    -  Primary    1. Cont Micronor daily hormone therapy 2. Hold Phentermine for now.  Allow time for stress reduction and see if BP improves.  May need referral for HTN further management, al;so may need bariatric referral.  Consider trial of Phentermine +/-Topamax if HTN better under control.  A total of 20 minutes were spent face-to-face with the patient as well as preparation, review, communication, and documentation during this encounter.   02/10/2021, MD, An[facility] Major [facility]/G[facility], [facility] 03/13/2021  11:40 AM

## 2021-03-19 ENCOUNTER — Ambulatory Visit: Payer: BC Managed Care – PPO | Admitting: Family Medicine

## 2021-03-27 ENCOUNTER — Ambulatory Visit (INDEPENDENT_AMBULATORY_CARE_PROVIDER_SITE_OTHER): Payer: BC Managed Care – PPO | Admitting: Family Medicine

## 2021-03-27 ENCOUNTER — Encounter: Payer: Self-pay | Admitting: Family Medicine

## 2021-03-27 ENCOUNTER — Other Ambulatory Visit: Payer: Self-pay

## 2021-03-27 VITALS — BP 134/87 | HR 76 | Temp 98.0°F | Wt 326.0 lb

## 2021-03-27 DIAGNOSIS — N3000 Acute cystitis without hematuria: Secondary | ICD-10-CM | POA: Diagnosis not present

## 2021-03-27 DIAGNOSIS — I10 Essential (primary) hypertension: Secondary | ICD-10-CM | POA: Diagnosis not present

## 2021-03-27 DIAGNOSIS — Z6841 Body Mass Index (BMI) 40.0 and over, adult: Secondary | ICD-10-CM

## 2021-03-27 DIAGNOSIS — R300 Dysuria: Secondary | ICD-10-CM | POA: Diagnosis not present

## 2021-03-27 LAB — URINALYSIS, ROUTINE W REFLEX MICROSCOPIC
Bilirubin, UA: NEGATIVE
Glucose, UA: NEGATIVE
Ketones, UA: NEGATIVE
Leukocytes,UA: NEGATIVE
Nitrite, UA: POSITIVE — AB
Protein,UA: NEGATIVE
RBC, UA: NEGATIVE
Specific Gravity, UA: 1.02 (ref 1.005–1.030)
Urobilinogen, Ur: 1 mg/dL (ref 0.2–1.0)
pH, UA: 7 (ref 5.0–7.5)

## 2021-03-27 LAB — MICROSCOPIC EXAMINATION
RBC, Urine: NONE SEEN /hpf (ref 0–2)
WBC, UA: NONE SEEN /hpf (ref 0–5)

## 2021-03-27 LAB — MICROALBUMIN, URINE WAIVED
Creatinine, Urine Waived: 200 mg/dL (ref 10–300)
Microalb, Ur Waived: 30 mg/L — ABNORMAL HIGH (ref 0–19)
Microalb/Creat Ratio: <30 mg/g (ref <30)

## 2021-03-27 MED ORDER — LISINOPRIL-HYDROCHLOROTHIAZIDE 10-12.5 MG PO TABS: 1.0000 | ORAL_TABLET | Freq: Every day | ORAL | 1 refills | Status: DC

## 2021-03-27 MED ORDER — HYDROCHLOROTHIAZIDE 12.5 MG PO CAPS: 12.5000 mg | ORAL_CAPSULE | Freq: Every day | ORAL | 1 refills | Status: DC

## 2021-03-27 MED ORDER — NITROFURANTOIN MONOHYD MACRO 100 MG PO CAPS: 100.0000 mg | ORAL_CAPSULE | Freq: Two times a day (BID) | ORAL | 0 refills | Status: DC

## 2021-03-27 NOTE — Progress Notes (Signed)
BP 134/87   Pulse 76   Temp 98 F (36.7 C)   Wt (!) 326 lb (147.9 kg)   SpO2 97%   BMI 52.62 kg/m    Subjective:    Patient ID: Regina Chandler, female    DOB: 1991/09/26, 30 y.o.   MRN: [id number]  HPI: Regina Chandler is a 30 y.o. female  Chief Complaint  Patient presents with  . Hypertension  . Urinary Tract Infection    Patient states her urine has a strong odor, frequent urination, and vaginal itching.    HYPERTENSION Hypertension status: controlled  Satisfied with current treatment? yes Duration of hypertension: chronic BP monitoring frequency:  not checking BP medication side effects:  no Medication compliance: excellent compliance Previous BP meds: lisinopril, HCTZ Aspirin: no Recurrent headaches: no Visual changes: no Palpitations: no Dyspnea: no Chest pain: no Lower extremity edema: no Dizzy/lightheaded: no  URINARY SYMPTOMS Duration: about a week Dysuria: no Urinary frequency: yes Urgency: yes Small volume voids: yes Symptom severity: moderate Urinary incontinence: no Foul odor: yes Hematuria: no Abdominal pain: no Back pain: no Suprapubic pain/pressure: yes Flank pain: no Fever:  no Vomiting: no Relief with cranberry juice: no Relief with pyridium: no Status: worse Previous urinary tract infection: yes Recurrent urinary tract infection: no History of sexually transmitted disease: no Vaginal discharge: no Treatments attempted: cranberry and increasing fluids   OBESITY Duration: chronic Previous attempts at weight loss: yes, diet, exercise, meds, phentermine Complications of obesity: HTN Peak weight: current 326 Weight loss goal: to be healthy Weight loss to date: none Requesting obesity pharmacotherapy: no Current weight loss supplements/medications: yes Previous weight loss supplements/meds: no   Relevant past medical, surgical, family and social history reviewed and updated as indicated. Interim medical history since our  last visit reviewed. Allergies and medications reviewed and updated.  Review of Systems  Constitutional: Negative.   Respiratory: Negative.   Cardiovascular: Negative.   Gastrointestinal: Negative.   Genitourinary: Negative.   Musculoskeletal: Negative.   Psychiatric/Behavioral: Negative.     Per HPI unless specifically indicated above     Objective:    BP 134/87   Pulse 76   Temp 98 F (36.7 C)   Wt (!) 326 lb (147.9 kg)   SpO2 97%   BMI 52.62 kg/m   Wt Readings from Last 3 Encounters:  03/27/21 (!) 326 lb (147.9 kg)  03/13/21 (!) 324 lb (147 kg)  02/13/21 (!) 324 lb (147 kg)    Physical Exam Vitals and nursing note reviewed.  Constitutional:      General: She is not in acute distress.    Appearance: Normal appearance. She is obese. She is not ill-appearing, toxic-appearing or diaphoretic.  HENT:     Head: Normocephalic and atraumatic.     Right Ear: External ear normal.     Left Ear: External ear normal.     Nose: Nose normal.     Mouth/Throat:     Mouth: Mucous membranes are moist.     Pharynx: Oropharynx is clear.  Eyes:     General: No scleral icterus.       Right eye: No discharge.        Left eye: No discharge.     Extraocular Movements: Extraocular movements intact.     Conjunctiva/sclera: Conjunctivae normal.     Pupils: Pupils are equal, round, and reactive to light.  Cardiovascular:     Rate and Rhythm: Normal rate and regular rhythm.     Pulses: Normal  pulses.     Heart sounds: Normal heart sounds. No murmur heard. No friction rub. No gallop.   Pulmonary:     Effort: Pulmonary effort is normal. No respiratory distress.     Breath sounds: Normal breath sounds. No stridor. No wheezing, rhonchi or rales.  Chest:     Chest wall: No tenderness.  Musculoskeletal:        General: Normal range of motion.     Cervical back: Normal range of motion and neck supple.  Skin:    General: Skin is warm and dry.     Capillary Refill: Capillary refill takes  less than 2 seconds.     Coloration: Skin is not jaundiced or pale.     Findings: No bruising, erythema, lesion or rash.  Neurological:     General: No focal deficit present.     Mental Status: She is alert and oriented to person, place, and time. Mental status is at baseline.  Psychiatric:        Mood and Affect: Mood normal.        Behavior: Behavior normal.        Thought Content: Thought content normal.        Judgment: Judgment normal.     Results for orders placed or performed in visit on 01/26/21  Cytology - PAP  Result Value Ref Range   Adequacy      Satisfactory for evaluation; transformation zone component ABSENT.   Diagnosis      - Negative for intraepithelial lesion or malignancy (NILM)      Assessment & Plan:   Problem List Items Addressed This Visit      Cardiovascular and Mediastinum   Essential hypertension - Primary    Under good control on current regimen. Continue current regimen. Continue to monitor. Call with any concerns. Refills given. Labs drawn today.        Relevant Medications   hydrochlorothiazide (MICROZIDE) 12.5 MG capsule   lisinopril-hydrochlorothiazide (ZESTORETIC) 10-12.5 MG tablet   Other Relevant Orders   Basic metabolic panel   Microalbumin, Urine Waived     Other   BMI 50.0-59.9, adult (HCC)    Would like to look into bariatric surgery. Will refer her today.       Relevant Orders   Ambulatory referral to General Surgery    Other Visit Diagnoses    Acute cystitis without hematuria       Will treat with macrobid. Call if not getting better or getting worse.    Dysuria       Will treat.    Relevant Orders   Urinalysis, Routine w reflex microscopic       Follow up plan: Return in about 6 months (around 09/26/2021) for physical.

## 2021-03-27 NOTE — Assessment & Plan Note (Signed)
Would like to look into bariatric surgery. Will refer her today.

## 2021-03-27 NOTE — Assessment & Plan Note (Signed)
Under good control on current regimen. Continue current regimen. Continue to monitor. Call with any concerns. Refills given. Labs drawn today.   

## 2021-04-03 ENCOUNTER — Ambulatory Visit (INDEPENDENT_AMBULATORY_CARE_PROVIDER_SITE_OTHER): Payer: BC Managed Care – PPO | Admitting: Obstetrics & Gynecology

## 2021-04-03 ENCOUNTER — Other Ambulatory Visit: Payer: Self-pay

## 2021-04-03 ENCOUNTER — Encounter: Payer: Self-pay | Admitting: Obstetrics & Gynecology

## 2021-04-03 VITALS — BP 140/100 | Ht 66.0 in | Wt 329.0 lb

## 2021-04-03 DIAGNOSIS — Z6841 Body Mass Index (BMI) 40.0 and over, adult: Secondary | ICD-10-CM

## 2021-04-03 DIAGNOSIS — N921 Excessive and frequent menstruation with irregular cycle: Secondary | ICD-10-CM

## 2021-04-03 NOTE — Progress Notes (Signed)
Obstetrics & Gynecology Office Visit   Chief Complaint  Patient presents with  . Follow-up  . Blood Pressure Check    History of Present Illness: 30 y.o. [id number] being seen for follow up blood pressure check today.  The patient is not pregnantThe established diagnosis for the patient is chronic hypertension.  She is currently on meds (sees PCP).  She reports no current symptoms attributable to her blood pressure.  Medication list reviewed medications which may contribute to BP elevation were not noted.  She has been off of Phentermine due to this.  She continues to struggle w weight gain and desires more help.  Reports improved periods on Micronor.  2 day light period this past month.  BTL for contraception in place.  Past Medical History:  Past Medical History:  Diagnosis Date  . Anemia   . Anxiety   . Bipolar 1 disorder (HCC)   . Depression   . GERD (gastroesophageal reflux disease)    NO MEDS  . Headache   . Hepatitis B affecting pregnancy 2007   No current treatment required, titers show up negative  . Hypertension     Past Surgical History:  Past Surgical History:  Procedure Laterality Date  . LAPAROSCOPIC TUBAL LIGATION N/A 07/30/2018   Procedure: LAPAROSCOPIC TUBAL LIGATION;  Surgeon: Vena Austria, MD;  Location: [facility] ORS;  Service: Gynecology;  Laterality: N/A;  . NO PAST SURGERIES    . NO PAST SURGERIES    . TUBAL LIGATION  07/30/2018   [facility])    Gynecologic History: No LMP recorded.  Obstetric History: [id number]  Family History:  Family History  Problem Relation Age of Onset  . Hypertension Sister   . Hypertension Brother   . Hypertension Maternal Grandmother   . Diabetes Maternal Grandmother   . Hypertension Maternal Grandfather   . Hypertension Paternal Grandmother   . Hypertension Paternal Grandfather   . Cancer Neg Hx   . Heart disease Neg Hx   . Stroke Neg Hx     Social History:  Social History   Socioeconomic History   . Marital status: Single    Spouse name: Not on file  . Number of children: Not on file  . Years of education: Not on file  . Highest education level: Not on file  Occupational History  . Not on file  Tobacco Use  . Smoking status: Never Smoker  . Smokeless tobacco: Never Used  Vaping Use  . Vaping Use: Never used  Substance and Sexual Activity  . Alcohol use: Not Currently    Comment: RARE  . Drug use: No  . Sexual activity: Yes    Birth control/protection: Surgical    Comment: Tubal ligation  Other Topics Concern  . Not on file  Social History Narrative  . Not on file   Social Determinants of Health   Financial Resource Strain: Not on file  Food Insecurity: Not on file  Transportation Needs: Not on file  Physical Activity: Not on file  Stress: Not on file  Social Connections: Not on file  Intimate Partner Violence: Not on file    Allergies:  Allergies  Allergen Reactions  . Penicillins Swelling    Has patient had a PCN reaction causing immediate rash, facial/tongue/throat swelling, SOB or lightheadedness with hypotension: No Has patient had a PCN reaction causing severe rash involving mucus membranes or skin necrosis: No Has patient had a PCN reaction that required hospitalization No Has patient had a PCN reaction  occurring within the last 10 years: No If all of the above answers are "NO", then may proceed with Cephalosporin use.    Medications: Prior to Admission medications   Medication Sig Start Date End Date Taking? Authorizing Provider  acetaminophen (TYLENOL) 500 MG tablet Take 1 tablet (500 mg total) by mouth every 6 (six) hours as needed. 03/06/20  Yes Enid Derry, PA-C  albuterol (VENTOLIN HFA) 108 (90 Base) MCG/ACT inhaler Inhale 2 puffs into the lungs every 6 (six) hours as needed for wheezing or shortness of breath. 08/09/20  Yes Pia Mau M, PA-C  aluminum chloride (DRYSOL) 20 % external solution Apply topically at bedtime. 08/06/19  Yes Particia Nearing, PA-C  fluticasone [facility]) 50 MCG/ACT nasal spray Place 2 sprays into both nostrils daily. 10/18/20  Yes Cannady, Jolene T, NP  hydrochlorothiazide (MICROZIDE) 12.5 MG capsule Take 1 capsule (12.5 mg total) by mouth daily. 03/27/21  Yes Johnson, Megan P, DO  ketoconazole (NIZORAL) 2 % cream APPLY TO AFFECTED AREA TWICE A DAY 12/17/19  Yes Particia Nearing, PA-C  lisinopril-hydrochlorothiazide (ZESTORETIC) 10-12.5 MG tablet Take 1 tablet by mouth daily. 03/27/21  Yes Johnson, Megan P, DO  loratadine (CLARITIN) 10 MG tablet Take 1 tablet (10 mg total) by mouth daily. 10/18/20  Yes Cannady, Jolene T, NP  nitrofurantoin, macrocrystal-monohydrate, (MACROBID) 100 MG capsule Take 1 capsule (100 mg total) by mouth 2 (two) times daily. 03/27/21  Yes Johnson, Megan P, DO  norethindrone (MICRONOR) 0.35 MG tablet Take 1 tablet (0.35 mg total) by mouth daily. 02/13/21  Yes Nadara Mustard, MD    Review of Systems  All other systems reviewed and are negative.   Physical Exam Blood pressure (!) 140/100, height 5\' 6"  (1.676 m), weight (!) 329 lb (149.2 kg). Repeat BP 140/90  General: NAD HEENT: normocephalic, anicteric Pulmonary: No increased work of breathing Cardiovascular: RRR, distal pulses 2+ Extremities: noedema, no erythema, no tenderness Neurologic: Grossly intact Psychiatric: mood appropriate, affect full  Assessment: 30 y.o. 30  Plan: Problem List Items Addressed This Visit      Other   Obesity - Primary   Relevant Orders   Amb Referral to Bariatric Surgery    Other Visit Diagnoses    Menometrorrhagia         Cont Micronor daily for period control   Chronic Hypertension       Cont meds and monitoring and lifestyle changes to aid in BP control   A total of 22 minutes were spent face-to-face with the patient as well as preparation, review, communication, and documentation during this encounter.   [id number], MD, Annamarie Major Ob/G[facility], [facility] 04/03/2021  10:14 AM

## 2021-04-30 ENCOUNTER — Encounter (INDEPENDENT_AMBULATORY_CARE_PROVIDER_SITE_OTHER): Payer: Self-pay

## 2021-06-12 ENCOUNTER — Encounter (INDEPENDENT_AMBULATORY_CARE_PROVIDER_SITE_OTHER): Payer: Self-pay

## 2021-06-14 ENCOUNTER — Ambulatory Visit (INDEPENDENT_AMBULATORY_CARE_PROVIDER_SITE_OTHER): Payer: BC Managed Care – PPO | Admitting: Bariatrics

## 2021-06-28 ENCOUNTER — Ambulatory Visit (INDEPENDENT_AMBULATORY_CARE_PROVIDER_SITE_OTHER): Payer: BC Managed Care – PPO | Admitting: Bariatrics

## 2021-07-17 ENCOUNTER — Ambulatory Visit: Payer: Self-pay

## 2021-07-17 NOTE — Telephone Encounter (Signed)
Summary: rash on face   Pt stated she has had a rash on her face for about a week and it burns when she applys ointment/ please advise / pt does have an appt with Dr. Laural Benes tomorrow     Second attempted to reach patient- no answer- mailbox full- unable to leave call back message.

## 2021-07-17 NOTE — Telephone Encounter (Signed)
VM full

## 2021-07-17 NOTE — Telephone Encounter (Signed)
Third attempt to reach patient- no answer and mailbox full. Call sent to office for review- patient does have appointment scheduled for tomorrow.

## 2021-07-18 ENCOUNTER — Ambulatory Visit (INDEPENDENT_AMBULATORY_CARE_PROVIDER_SITE_OTHER): Payer: BC Managed Care – PPO | Admitting: Family Medicine

## 2021-07-18 ENCOUNTER — Encounter: Payer: Self-pay | Admitting: Family Medicine

## 2021-07-18 ENCOUNTER — Other Ambulatory Visit: Payer: Self-pay

## 2021-07-18 VITALS — BP 138/85 | HR 73 | Temp 97.9°F | Wt 331.2 lb

## 2021-07-18 DIAGNOSIS — N898 Other specified noninflammatory disorders of vagina: Secondary | ICD-10-CM

## 2021-07-18 DIAGNOSIS — R300 Dysuria: Secondary | ICD-10-CM | POA: Diagnosis not present

## 2021-07-18 DIAGNOSIS — R21 Rash and other nonspecific skin eruption: Secondary | ICD-10-CM

## 2021-07-18 DIAGNOSIS — N760 Acute vaginitis: Secondary | ICD-10-CM | POA: Diagnosis not present

## 2021-07-18 DIAGNOSIS — I10 Essential (primary) hypertension: Secondary | ICD-10-CM

## 2021-07-18 DIAGNOSIS — B9689 Other specified bacterial agents as the cause of diseases classified elsewhere: Secondary | ICD-10-CM

## 2021-07-18 DIAGNOSIS — Z862 Personal history of diseases of the blood and blood-forming organs and certain disorders involving the immune mechanism: Secondary | ICD-10-CM

## 2021-07-18 LAB — WET PREP FOR TRICH, YEAST, CLUE
Clue Cell Exam: POSITIVE — AB
Trichomonas Exam: NEGATIVE
Yeast Exam: NEGATIVE

## 2021-07-18 LAB — URINALYSIS, ROUTINE W REFLEX MICROSCOPIC
Bilirubin, UA: NEGATIVE
Glucose, UA: NEGATIVE
Ketones, UA: NEGATIVE
Leukocytes,UA: NEGATIVE
Nitrite, UA: NEGATIVE
Protein,UA: NEGATIVE
RBC, UA: NEGATIVE
Specific Gravity, UA: 1.025 (ref 1.005–1.030)
Urobilinogen, Ur: 1 mg/dL (ref 0.2–1.0)
pH, UA: 6 (ref 5.0–7.5)

## 2021-07-18 MED ORDER — METRONIDAZOLE 500 MG PO TABS: 500.0000 mg | ORAL_TABLET | Freq: Two times a day (BID) | ORAL | 0 refills | Status: DC

## 2021-07-18 NOTE — Assessment & Plan Note (Signed)
Encouraged diet and exercise with goal of losing 1-2lbs per week. Continue to monitor. Call with any concerns. Information about medications provided- will call if any are covered.

## 2021-07-18 NOTE — Assessment & Plan Note (Signed)
Under good control on current regimen. Continue current regimen. Continue to monitor. Call with any concerns. Labs drawn today.  

## 2021-07-18 NOTE — Progress Notes (Signed)
BP 138/85   Pulse 73   Temp 97.9 F (36.6 C) (Oral)   Wt (!) 331 lb 3.2 oz (150.2 kg)   SpO2 98%   BMI 53.46 kg/m    Subjective:    Patient ID: Regina Chandler, female    DOB: [birth year], 30 y.o.   MRN: [id number]  HPI: Regina Chandler is a 30 y.o. female  Chief Complaint  Patient presents with   Urinary Tract Infection    Pt states she has been having urinary urgency and vaginal itching that started about 2 weeks ago    Rash    Pt states she has a rash on her face that itches, states she first noticed it about a week and half ago   URINARY SYMPTOMS Duration: about 2 weeks Dysuria: yes Urinary frequency: yes Urgency: yes Small volume voids:  yes Symptom severity: no Urinary incontinence: yes Foul odor: yes Hematuria: no Abdominal pain: yes Back pain: no Suprapubic pain/pressure: yes Flank pain: no Fever:  no Vomiting: no Relief with cranberry juice: no Relief with pyridium: no Status: worse Previous urinary tract infection: yes Recurrent urinary tract infection: no Sexual activity: practicing safe sex Vaginal discharge: yes Treatments attempted: increasing fluids  RASH Duration:  1 week  Location: face  Itching: yes Burning: no Redness: no Oozing: no Scaling: no Blisters: yes Painful: no Fevers: no Change in detergents/soaps/personal care products: no Recent illness: no Recent travel:no History of same: yes Context: better Alleviating factors: lotion/moisturizer Treatments attempted:lotion/moisturizer Shortness of breath: no  Throat/tongue swelling: no Myalgias/arthralgias: no  OBESITY Duration: chronic Previous attempts at weight loss: yes Complications of obesity: HTN Peak weight: 331 (current) Weight loss goal: to be healthy Weight loss to date: none Requesting obesity pharmacotherapy: yes Current weight loss supplements/medications: no Previous weight loss supplements/meds: topamax    Relevant past medical, surgical, family  and social history reviewed and updated as indicated. Interim medical history since our last visit reviewed. Allergies and medications reviewed and updated.  Review of Systems  Constitutional: Negative.   Respiratory: Negative.    Cardiovascular: Negative.   Gastrointestinal: Negative.   Genitourinary:  Positive for dysuria and frequency. Negative for decreased urine volume, difficulty urinating, dyspareunia, enuresis, flank pain, genital sores, hematuria, menstrual problem, pelvic pain, urgency, vaginal bleeding, vaginal discharge and vaginal pain.  Musculoskeletal: Negative.   Skin:  Positive for rash. Negative for color change, pallor and wound.  Psychiatric/Behavioral: Negative.     Per HPI unless specifically indicated above     Objective:    BP 138/85   Pulse 73   Temp 97.9 F (36.6 C) (Oral)   Wt (!) 331 lb 3.2 oz (150.2 kg)   SpO2 98%   BMI 53.46 kg/m   Wt Readings from Last 3 Encounters:  07/18/21 (!) 331 lb 3.2 oz (150.2 kg)  04/03/21 (!) 329 lb (149.2 kg)  03/27/21 (!) 326 lb (147.9 kg)    Physical Exam Vitals and nursing note reviewed.  Constitutional:      General: She is not in acute distress.    Appearance: Normal appearance. She is obese. She is not ill-appearing, toxic-appearing or diaphoretic.  HENT:     Head: Normocephalic and atraumatic.     Right Ear: External ear normal.     Left Ear: External ear normal.     Nose: Nose normal.     Mouth/Throat:     Mouth: Mucous membranes are moist.     Pharynx: Oropharynx is clear.  Eyes:  General: No scleral icterus.       Right eye: No discharge.        Left eye: No discharge.     Extraocular Movements: Extraocular movements intact.     Conjunctiva/sclera: Conjunctivae normal.     Pupils: Pupils are equal, round, and reactive to light.  Cardiovascular:     Rate and Rhythm: Normal rate and regular rhythm.     Pulses: Normal pulses.     Heart sounds: Normal heart sounds. No murmur heard.   No friction  rub. No gallop.  Pulmonary:     Effort: Pulmonary effort is normal. No respiratory distress.     Breath sounds: Normal breath sounds. No stridor. No wheezing, rhonchi or rales.  Chest:     Chest wall: No tenderness.  Musculoskeletal:        General: Normal range of motion.     Cervical back: Normal range of motion and neck supple.  Skin:    General: Skin is warm and dry.     Capillary Refill: Capillary refill takes less than 2 seconds.     Coloration: Skin is not jaundiced or pale.     Findings: Rash (healing rash on L side of her face) present. No bruising, erythema or lesion.  Neurological:     General: No focal deficit present.     Mental Status: She is alert and oriented to person, place, and time. Mental status is at baseline.  Psychiatric:        Mood and Affect: Mood normal.        Behavior: Behavior normal.        Thought Content: Thought content normal.        Judgment: Judgment normal.    Results for orders placed or performed in visit on 07/18/21  WET PREP FOR TRICH, YEAST, CLUE   Specimen: Sterile Swab   Sterile Swab  Result Value Ref Range   Trichomonas Exam Negative Negative   Yeast Exam Negative Negative   Clue Cell Exam Positive (A) Negative  Urinalysis, Routine w reflex microscopic  Result Value Ref Range   Specific Gravity, UA 1.025 1.005 - 1.030   pH, UA 6.0 5.0 - 7.5   Color, UA Yellow Yellow   Appearance Ur Clear Clear   Leukocytes,UA Negative Negative   Protein,UA Negative Negative/Trace   Glucose, UA Negative Negative   Ketones, UA Negative Negative   RBC, UA Negative Negative   Bilirubin, UA Negative Negative   Urobilinogen, Ur 1.0 0.2 - 1.0 mg/dL   Nitrite, UA Negative Negative      Assessment & Plan:   Problem List Items Addressed This Visit       Cardiovascular and Mediastinum   Essential hypertension    Under good control on current regimen. Continue current regimen. Continue to monitor. Call with any concerns. Labs drawn today.        Relevant Orders   Basic metabolic panel     Genitourinary   Bacterial vaginosis - Primary    Will treat with flagyl. Call if not getting better or getting worse.       Relevant Medications   metroNIDAZOLE (FLAGYL) 500 MG tablet     Other   Morbid obesity (HCC)    Encouraged diet and exercise with goal of losing 1-2lbs per week. Continue to monitor. Call with any concerns. Information about medications provided- will call if any are covered.        Other Visit Diagnoses     Rash  Resolving. Encouraged neosporin/vasaline. Continue to monitor.    History of anemia       Rechecking labs today. Await results.    Relevant Orders   CBC with Differential/Platelet   Iron and TIBC   Ferritin   Dysuria       UA clear   Relevant Orders   Urinalysis, Routine w reflex microscopic (Completed)   Urine Culture   Vaginal itching       + clue cells   Relevant Orders   WET PREP FOR TRICH, YEAST, CLUE (Completed)        Follow up plan: Return October.

## 2021-07-18 NOTE — Assessment & Plan Note (Signed)
Will treat with flagyl. Call if not getting better or getting worse.  

## 2021-07-19 LAB — BASIC METABOLIC PANEL
BUN/Creatinine Ratio: 18 (ref 9–23)
BUN: 14 mg/dL (ref 6–20)
CO2: 23 mmol/L (ref 20–29)
Calcium: 9.2 mg/dL (ref 8.7–10.2)
Chloride: 103 mmol/L (ref 96–106)
Creatinine, Ser: 0.76 mg/dL (ref 0.57–1.00)
Glucose: 85 mg/dL (ref 65–99)
Potassium: 3.9 mmol/L (ref 3.5–5.2)
Sodium: 140 mmol/L (ref 134–144)
eGFR: 107 mL/min/{1.73_m2} (ref >59)

## 2021-07-19 LAB — IRON AND TIBC
Iron Saturation: 10 % — ABNORMAL LOW (ref 15–55)
Iron: 40 ug/dL (ref 27–159)
Total Iron Binding Capacity: 391 ug/dL (ref 250–450)
UIBC: 351 ug/dL (ref 131–425)

## 2021-07-19 LAB — CBC WITH DIFFERENTIAL/PLATELET
Basophils Absolute: 0.1 10*3/uL (ref 0.0–0.2)
Basos: 1 %
EOS (ABSOLUTE): 0.1 10*3/uL (ref 0.0–0.4)
Eos: 2 %
Hematocrit: 34.9 % (ref 34.0–46.6)
Hemoglobin: 11.2 g/dL (ref 11.1–15.9)
Immature Grans (Abs): 0 10*3/uL (ref 0.0–0.1)
Immature Granulocytes: 0 %
Lymphocytes Absolute: 2.3 10*3/uL (ref 0.7–3.1)
Lymphs: 37 %
MCH: 26.7 pg (ref 26.6–33.0)
MCHC: 32.1 g/dL (ref 31.5–35.7)
MCV: 83 fL (ref 79–97)
Monocytes Absolute: 0.4 10*3/uL (ref 0.1–0.9)
Monocytes: 7 %
Neutrophils Absolute: 3.4 10*3/uL (ref 1.4–7.0)
Neutrophils: 53 %
Platelets: 293 10*3/uL (ref 150–450)
RBC: 4.2 x10E6/uL (ref 3.77–5.28)
RDW: 13.6 % (ref 11.7–15.4)
WBC: 6.3 10*3/uL (ref 3.4–10.8)

## 2021-07-19 LAB — FERRITIN: Ferritin: 16 ng/mL (ref 15–150)

## 2021-07-21 LAB — URINE CULTURE

## 2021-07-24 ENCOUNTER — Ambulatory Visit: Payer: BC Managed Care – PPO | Admitting: Obstetrics & Gynecology

## 2021-08-21 ENCOUNTER — Encounter: Payer: Self-pay | Admitting: Family Medicine

## 2021-08-21 NOTE — Telephone Encounter (Signed)
Will leave for Dr. Laural Benes to review which medication she would prefer to utilize with this patient.

## 2021-08-27 ENCOUNTER — Other Ambulatory Visit: Payer: Self-pay | Admitting: Family Medicine

## 2021-08-27 MED ORDER — CONTRAVE 8-90 MG PO TB12: ORAL_TABLET | ORAL | 1 refills | Status: DC

## 2021-08-27 NOTE — Telephone Encounter (Signed)
Please schedule follow up in 4-6 weeks. Thanks!

## 2021-08-27 NOTE — Telephone Encounter (Signed)
Pt has a CPE for 10/19 which is 4 wks out. Pt wants to know if needs to make another appt in the same timeframe? Pls notify her if another appt additionally to CPE on 10/19. FU at [phone]

## 2021-08-27 NOTE — Telephone Encounter (Signed)
Called pt to get her scheduled, voicemail box is full

## 2021-08-27 NOTE — Telephone Encounter (Signed)
Requested medication (s) are on the active medication list Yes  Future visit scheduled Yes on 09/26/21  Note to clinic-Dr. Laural Benes approved this medication request earlier this morning. There is a pharmacy note that request an alternative due to insurance will not pay. I'm routing to office as I'm not sure if the pharmacy note is regarding the Contrave that was signed early this am.    Requested Prescriptions  Pending Prescriptions Disp Refills   CONTRAVE 8-90 MG TB12 [Pharmacy Med Name: CONTRAVE ER 8-90 MG TABLET] 120 tablet 1    Sig: START 1 TABLET EVERY MORNING FOR 7 DAYS, THEN 1 TABLET TWICE DAILY FOR 7 DAYS, THEN 2 TABLETS EVERY MORNING AND ONE EVERY EVENING     Off-Protocol Failed - 08/27/2021  9:21 AM      Failed - Medication not assigned to a protocol, review manually.      Passed - Valid encounter within last 12 months    Recent Outpatient Visits           1 month ago Bacterial vaginosis   [facility] [place], [place], DO   5 months ago Essential hypertension   [facility] [place], Megan P, DO   7 months ago Abnormal uterine bleeding   [facility] [place], Jake Church, NP   7 months ago COVID-19   [facility] Caro [place], DO   8 months ago Suspected COVID-19 virus infection   [facility] [place], [place], DO       Future Appointments             In 1 month Johnson, Oralia Rud, DO [employer] [facility]

## 2021-08-28 ENCOUNTER — Other Ambulatory Visit: Payer: Self-pay | Admitting: Family Medicine

## 2021-08-28 NOTE — Telephone Encounter (Signed)
Requested medication (s) are due for refill today: no   Requested medication (s) are on the active medication list: yes   Last refill:  08/27/21 #120 1 refill  Future visit scheduled: yes in 4 weeks  Notes to clinic:  medication not assigned to a protocol, tapered dose . Pharmacy comment: Alternative Requested:PRIOR AUTH FOR CONTRAVE     Requested Prescriptions  Pending Prescriptions Disp Refills   CONTRAVE 8-90 MG TB12 [Pharmacy Med Name: CONTRAVE ER 8-90 MG TABLET] 120 tablet 1    Sig: Start 1 tablet every morning for 7 days, then 1 tablet twice daily for 7 days, then 2 tablets every morning and one every evening     Off-Protocol Failed - 08/28/2021  9:15 AM      Failed - Medication not assigned to a protocol, review manually.      Passed - Valid encounter within last 12 months    Recent Outpatient Visits           1 month ago Bacterial vaginosis   [facility] [place], [place], DO   5 months ago Essential hypertension   [facility] [place], Megan P, DO   7 months ago Abnormal uterine bleeding   [facility] [place], Jake Church, NP   7 months ago COVID-19   [facility] Caro [place], DO   8 months ago Suspected COVID-19 virus infection   [facility] [place], [place], DO       Future Appointments             In 4 weeks Laural Benes, Oralia Rud, DO [employer]

## 2021-08-28 NOTE — Telephone Encounter (Signed)
Patient specifically said that she checked with the insurance and it was covered. I'm unsure what to do about this.

## 2021-08-29 NOTE — Telephone Encounter (Signed)
Please do PA

## 2021-08-30 NOTE — Telephone Encounter (Signed)
Prior Authorization initiated via CoverMyMeds for prescription Contrave. Awaiting determination.   KEY: [id number]

## 2021-08-31 NOTE — Telephone Encounter (Signed)
Called patient to discuss Dr.Johnson recommendations. Unable to leave a message due to voicemail being full.

## 2021-08-31 NOTE — Telephone Encounter (Signed)
Please let patient know and ask her what she would like to do

## 2021-09-26 ENCOUNTER — Encounter: Payer: BC Managed Care – PPO | Admitting: Family Medicine

## 2021-10-29 ENCOUNTER — Other Ambulatory Visit: Payer: Self-pay | Admitting: Family Medicine

## 2021-10-29 ENCOUNTER — Ambulatory Visit (INDEPENDENT_AMBULATORY_CARE_PROVIDER_SITE_OTHER): Payer: BC Managed Care – PPO | Admitting: Family Medicine

## 2021-10-29 ENCOUNTER — Other Ambulatory Visit: Payer: Self-pay

## 2021-10-29 ENCOUNTER — Encounter: Payer: Self-pay | Admitting: Family Medicine

## 2021-10-29 VITALS — BP 163/107 | HR 62 | Ht 65.98 in | Wt 320.0 lb

## 2021-10-29 DIAGNOSIS — I10 Essential (primary) hypertension: Secondary | ICD-10-CM | POA: Diagnosis not present

## 2021-10-29 DIAGNOSIS — Z23 Encounter for immunization: Secondary | ICD-10-CM

## 2021-10-29 DIAGNOSIS — B9689 Other specified bacterial agents as the cause of diseases classified elsewhere: Secondary | ICD-10-CM | POA: Diagnosis not present

## 2021-10-29 DIAGNOSIS — N760 Acute vaginitis: Secondary | ICD-10-CM | POA: Diagnosis not present

## 2021-10-29 DIAGNOSIS — N898 Other specified noninflammatory disorders of vagina: Secondary | ICD-10-CM | POA: Diagnosis not present

## 2021-10-29 DIAGNOSIS — N912 Amenorrhea, unspecified: Secondary | ICD-10-CM | POA: Diagnosis not present

## 2021-10-29 DIAGNOSIS — R300 Dysuria: Secondary | ICD-10-CM | POA: Diagnosis not present

## 2021-10-29 LAB — URINALYSIS, ROUTINE W REFLEX MICROSCOPIC
Bilirubin, UA: NEGATIVE
Glucose, UA: NEGATIVE
Ketones, UA: NEGATIVE
Nitrite, UA: NEGATIVE
Specific Gravity, UA: 1.02 (ref 1.005–1.030)
Urobilinogen, Ur: 1 mg/dL (ref 0.2–1.0)
pH, UA: 7 (ref 5.0–7.5)

## 2021-10-29 LAB — PREGNANCY, URINE: Preg Test, Ur: NEGATIVE

## 2021-10-29 LAB — MICROSCOPIC EXAMINATION: Bacteria, UA: NONE SEEN

## 2021-10-29 LAB — MICROALBUMIN, URINE WAIVED
Creatinine, Urine Waived: 100 mg/dL (ref 10–300)
Microalb, Ur Waived: 150 mg/L — ABNORMAL HIGH (ref 0–19)

## 2021-10-29 LAB — WET PREP FOR TRICH, YEAST, CLUE
Clue Cell Exam: POSITIVE — AB
Trichomonas Exam: NEGATIVE
Yeast Exam: NEGATIVE

## 2021-10-29 MED ORDER — CONTRAVE 8-90 MG PO TB12: ORAL_TABLET | ORAL | 1 refills | Status: DC

## 2021-10-29 MED ORDER — METRONIDAZOLE 500 MG PO TABS: 500.0000 mg | ORAL_TABLET | Freq: Two times a day (BID) | ORAL | 0 refills | Status: DC

## 2021-10-29 MED ORDER — LISINOPRIL-HYDROCHLOROTHIAZIDE 20-25 MG PO TABS: 1.0000 | ORAL_TABLET | Freq: Every day | ORAL | 3 refills | Status: DC

## 2021-10-29 NOTE — Assessment & Plan Note (Signed)
Will treat with flagyl. Call if not getting better or getting worse.

## 2021-10-29 NOTE — Assessment & Plan Note (Signed)
BP running high- will increase her lisinopril-HCTZ and recheck 1 month. Call with any concerns.

## 2021-10-29 NOTE — Telephone Encounter (Signed)
  Requested medications are on the active medication list yes  Last visit 10/29/21  Future visit scheduled 11/26/21  Notes to clinic pharm requesting something different, this med not covered by insurance, please assess. Requested Prescriptions  Pending Prescriptions Disp Refills   CONTRAVE 8-90 MG TB12 [Pharmacy Med Name: CONTRAVE ER 8-90 MG TABLET] 120 tablet 1    Sig: Start 1 tablet every morning for 7 days, then 1 tablet twice daily for 7 days, then 2 tablets every morning and one every evening     Off-Protocol Failed - 10/29/2021  2:35 PM      Failed - Medication not assigned to a protocol, review manually.      Passed - Valid encounter within last 12 months    Recent Outpatient Visits           Today Essential hypertension   [facility] [place], [place] P, DO   3 months ago Bacterial vaginosis   [facility] [place], [place], DO   7 months ago Essential hypertension   [facility] [place], Megan P, DO   9 months ago Abnormal uterine bleeding   [facility] [place], Jake Church, NP   10 months ago COVID-19   [employer], Darl [place], DO       Future Appointments             In 4 weeks Laural Benes, Oralia Rud, DO [employer]   In 1 month Johnson, Oralia Rud, DO [employer]

## 2021-10-29 NOTE — Progress Notes (Signed)
BP (!) 163/107   Pulse 62   Ht 5' 5.98" (1.676 m)   Wt (!) 320 lb (145.2 kg)   SpO2 99%   BMI 51.67 kg/m    Subjective:    Patient ID: Regina Chandler, female    DOB: 1991-04-28, 30 y.o.   MRN: [id number]  HPI: Regina Chandler is a 30 y.o. female  Chief Complaint  Patient presents with   Vaginal Itching   Urinary Frequency   URINARY SYMPTOMS Duration: 2 weeks Dysuria: no Urinary frequency: yes Urgency: yes Small volume voids: yes Symptom severity: moderate Urinary incontinence: yes Foul odor: yes Hematuria: no Abdominal pain: no Back pain: no Suprapubic pain/pressure: no Flank pain: no Fever:  no Vomiting: no Relief with cranberry juice: no Relief with pyridium: no Status: stable Previous urinary tract infection: yes Recurrent urinary tract infection: no History of sexually transmitted disease: no Vaginal discharge: yes Treatments attempted: cranberry and increasing fluids  HYPERTENSION Hypertension status: uncontrolled  Satisfied with current treatment? no Duration of hypertension: chronic BP monitoring frequency:  not checking BP medication side effects:  no Medication compliance: excellent compliance Previous BP meds:lisinopril-HCTZ Aspirin: no Recurrent headaches: yes Visual changes: no Palpitations: no Dyspnea: no Chest pain: no Lower extremity edema: no Dizzy/lightheaded: no   WEIGHT GAIN Duration: chronic Previous attempts at weight loss: yes Complications of obesity: HTN Peak weight: 331 Weight loss goal: to he healthy  Weight loss to date: none Requesting obesity pharmacotherapy: yes Current weight loss supplements/medications: yes Previous weight loss supplements/meds: yes  Relevant past medical, surgical, family and social history reviewed and updated as indicated. Interim medical history since our last visit reviewed. Allergies and medications reviewed and updated.  Review of Systems  Constitutional: Negative.    Respiratory: Negative.    Cardiovascular: Negative.   Gastrointestinal: Negative.   Genitourinary:  Positive for dysuria, frequency, urgency and vaginal discharge. Negative for decreased urine volume, difficulty urinating, dyspareunia, enuresis, flank pain, genital sores, hematuria, menstrual problem, pelvic pain, vaginal bleeding and vaginal pain.  Musculoskeletal: Negative.   Psychiatric/Behavioral: Negative.     Per HPI unless specifically indicated above     Objective:    BP (!) 163/107   Pulse 62   Ht 5' 5.98" (1.676 m)   Wt (!) 320 lb (145.2 kg)   SpO2 99%   BMI 51.67 kg/m   Wt Readings from Last 3 Encounters:  10/29/21 (!) 320 lb (145.2 kg)  07/18/21 (!) 331 lb 3.2 oz (150.2 kg)  04/03/21 (!) 329 lb (149.2 kg)    Physical Exam Vitals and nursing note reviewed.  Constitutional:      General: She is not in acute distress.    Appearance: Normal appearance. She is not ill-appearing, toxic-appearing or diaphoretic.  HENT:     Head: Normocephalic and atraumatic.     Right Ear: External ear normal.     Left Ear: External ear normal.     Nose: Nose normal.     Mouth/Throat:     Mouth: Mucous membranes are moist.     Pharynx: Oropharynx is clear.  Eyes:     General: No scleral icterus.       Right eye: No discharge.        Left eye: No discharge.     Extraocular Movements: Extraocular movements intact.     Conjunctiva/sclera: Conjunctivae normal.     Pupils: Pupils are equal, round, and reactive to light.  Cardiovascular:     Rate and Rhythm: Normal rate and  regular rhythm.     Pulses: Normal pulses.     Heart sounds: Normal heart sounds. No murmur heard.   No friction rub. No gallop.  Pulmonary:     Effort: Pulmonary effort is normal. No respiratory distress.     Breath sounds: Normal breath sounds. No stridor. No wheezing, rhonchi or rales.  Chest:     Chest wall: No tenderness.  Musculoskeletal:        General: Normal range of motion.     Cervical back:  Normal range of motion and neck supple.  Skin:    General: Skin is warm and dry.     Capillary Refill: Capillary refill takes less than 2 seconds.     Coloration: Skin is not jaundiced or pale.     Findings: No bruising, erythema, lesion or rash.  Neurological:     General: No focal deficit present.     Mental Status: She is alert and oriented to person, place, and time. Mental status is at baseline.  Psychiatric:        Mood and Affect: Mood normal.        Behavior: Behavior normal.        Thought Content: Thought content normal.        Judgment: Judgment normal.    Results for orders placed or performed in visit on 10/29/21  Microscopic Examination  Result Value Ref Range   WBC, UA 0-5 0 - 5 /hpf   RBC 3-10 (A) 0 - 2 /hpf   Epithelial Cells (non renal) 0-10 0 - 10 /hpf   Mucus, UA Present (A) Not Estab.   Bacteria, UA None seen None seen/Few  WET PREP FOR TRICH, YEAST, CLUE   Urine  Result Value Ref Range   Trichomonas Exam Negative Negative   Yeast Exam Negative Negative   Clue Cell Exam Positive (A) Negative  Urinalysis, Routine w reflex microscopic  Result Value Ref Range   Specific Gravity, UA 1.020 1.005 - 1.030   pH, UA 7.0 5.0 - 7.5   Color, UA Yellow Yellow   Appearance Ur Cloudy (A) Clear   Leukocytes,UA Trace (A) Negative   Protein,UA 1+ (A) Negative/Trace   Glucose, UA Negative Negative   Ketones, UA Negative Negative   RBC, UA 3+ (A) Negative   Bilirubin, UA Negative Negative   Urobilinogen, Ur 1.0 0.2 - 1.0 mg/dL   Nitrite, UA Negative Negative   Microscopic Examination See below:   Microalbumin, Urine Waived  Result Value Ref Range   Microalb, Ur Waived 150 (H) 0 - 19 mg/L   Creatinine, Urine Waived 100 10 - 300 mg/dL   Microalb/Creat Ratio 30-300 (H) <30 mg/g  Pregnancy, urine  Result Value Ref Range   Preg Test, Ur Negative Negative      Assessment & Plan:   Problem List Items Addressed This Visit       Cardiovascular and Mediastinum    Essential hypertension - Primary    BP running high- will increase her lisinopril-HCTZ and recheck 1 month. Call with any concerns.       Relevant Medications   lisinopril-hydrochlorothiazide (ZESTORETIC) 20-25 MG tablet   Other Relevant Orders   Basic metabolic panel   Microalbumin, Urine Waived (Completed)     Genitourinary   Bacterial vaginosis    Will treat with flagyl. Call if not getting better or getting worse.       Relevant Medications   metroNIDAZOLE (FLAGYL) 500 MG tablet     Other  Morbid obesity (HCC)    Tolerating medicine well. Has lost 11lbs today, and has been down 18 until last week. Contrave is effective. Refills given today.      Relevant Medications   Naltrexone-buPROPion HCl ER (CONTRAVE) 8-90 MG TB12   Other Visit Diagnoses     Amenorrhea       Seeing GYN in 3 weeks. Will check pregnancy today. Await results.    Relevant Orders   Pregnancy, urine   Vaginal itching       +BV   Relevant Orders   WET PREP FOR TRICH, YEAST, CLUE   Need for influenza vaccination       Relevant Orders   Flu Vaccine QUAD 78mo+IM (Fluarix, Fluzone & Alfiuria Quad PF) (Completed)   Dysuria       Relevant Orders   Urinalysis, Routine w reflex microscopic (Completed)        Follow up plan: Return in about 4 weeks (around 11/26/2021), or physical.

## 2021-10-29 NOTE — Assessment & Plan Note (Signed)
Tolerating medicine well. Has lost 11lbs today, and has been down 18 until last week. Contrave is effective. Refills given today.

## 2021-10-30 LAB — BASIC METABOLIC PANEL
BUN/Creatinine Ratio: 19 (ref 9–23)
BUN: 14 mg/dL (ref 6–20)
CO2: 25 mmol/L (ref 20–29)
Calcium: 8.7 mg/dL (ref 8.7–10.2)
Chloride: 101 mmol/L (ref 96–106)
Creatinine, Ser: 0.75 mg/dL (ref 0.57–1.00)
Glucose: 75 mg/dL (ref 70–99)
Potassium: 3.5 mmol/L (ref 3.5–5.2)
Sodium: 141 mmol/L (ref 134–144)
eGFR: 108 mL/min/{1.73_m2} (ref >59)

## 2021-11-20 ENCOUNTER — Ambulatory Visit: Payer: BC Managed Care – PPO | Admitting: Obstetrics & Gynecology

## 2021-11-26 ENCOUNTER — Encounter: Payer: Self-pay | Admitting: Family Medicine

## 2021-11-26 ENCOUNTER — Other Ambulatory Visit: Payer: Self-pay

## 2021-11-26 ENCOUNTER — Ambulatory Visit (INDEPENDENT_AMBULATORY_CARE_PROVIDER_SITE_OTHER): Payer: Medicaid Other | Admitting: Family Medicine

## 2021-11-26 VITALS — BP 136/88 | HR 72 | Temp 98.4°F | Ht 66.0 in | Wt 320.4 lb

## 2021-11-26 DIAGNOSIS — I10 Essential (primary) hypertension: Secondary | ICD-10-CM

## 2021-11-26 DIAGNOSIS — Z0000 Encounter for general adult medical examination without abnormal findings: Secondary | ICD-10-CM | POA: Diagnosis not present

## 2021-11-26 LAB — URINALYSIS, ROUTINE W REFLEX MICROSCOPIC
Bilirubin, UA: NEGATIVE
Glucose, UA: NEGATIVE
Leukocytes,UA: NEGATIVE
Nitrite, UA: NEGATIVE
RBC, UA: NEGATIVE
Specific Gravity, UA: 1.025 (ref 1.005–1.030)
Urobilinogen, Ur: 1 mg/dL (ref 0.2–1.0)
pH, UA: 6 (ref 5.0–7.5)

## 2021-11-26 LAB — MICROALBUMIN, URINE WAIVED
Creatinine, Urine Waived: 200 mg/dL (ref 10–300)
Microalb, Ur Waived: 30 mg/L — ABNORMAL HIGH (ref 0–19)
Microalb/Creat Ratio: <30 mg/g (ref <30)

## 2021-11-26 MED ORDER — LISINOPRIL-HYDROCHLOROTHIAZIDE 20-25 MG PO TABS: 1.0000 | ORAL_TABLET | Freq: Every day | ORAL | 1 refills | Status: DC

## 2021-11-26 NOTE — Progress Notes (Signed)
BP 136/88    Pulse 72    Temp 98.4 F (36.9 C)    Ht '5\' 6"'  (1.676 m)    Wt (!) 320 lb 6.4 oz (145.3 kg)    SpO2 98%    BMI 51.71 kg/m    Subjective:    Patient ID: Regina Chandler, female    DOB: 07-05-1991, 30 y.o.   MRN: [id number]  HPI: ANDERSON COPPOCK is a 30 y.o. female presenting on 11/26/2021 for comprehensive medical examination. Current medical complaints include:  HYPERTENSION Hypertension status: controlled  Satisfied with current treatment? yes Duration of hypertension: chronic BP monitoring frequency:  not checking BP medication side effects:  no Medication compliance: excellent compliance Previous BP meds: Aspirin: no Recurrent headaches: no Visual changes: no Palpitations: no Dyspnea: no Chest pain: no Lower extremity edema: no Dizzy/lightheaded: no  Menopausal Symptoms: no  Depression Screen done today and results listed below:  Depression screen [facility] 2/9 09/18/2018 09/04/2018  Decreased Interest 0 1  Down, Depressed, Hopeless 0 1  PHQ - 2 Score 0 2  Altered sleeping 1 3  Tired, decreased energy 1 3  Change in appetite 0 1  Feeling bad or failure about yourself  0 0  Trouble concentrating 0 1  Moving slowly or fidgety/restless 0 0  Suicidal thoughts 0 0  PHQ-9 Score 2 10    Past Medical History:  Past Medical History:  Diagnosis Date   Anemia    Anxiety    Bipolar 1 disorder (HCC)    Depression    GERD (gastroesophageal reflux disease)    NO MEDS   Headache    Hepatitis B affecting pregnancy 2007   No current treatment required, titers show up negative   Hypertension     Surgical History:  Past Surgical History:  Procedure Laterality Date   LAPAROSCOPIC TUBAL LIGATION N/A 07/30/2018   Procedure: LAPAROSCOPIC TUBAL LIGATION;  Surgeon: Malachy Mood, MD;  Location: [facility] ORS;  Service: Gynecology;  Laterality: N/A;   NO PAST SURGERIES     NO PAST SURGERIES     TUBAL LIGATION  07/30/2018   [facility])    Medications:   Current Outpatient Medications on File Prior to Visit  Medication Sig   acetaminophen (TYLENOL) 500 MG tablet Take 1 tablet (500 mg total) by mouth every 6 (six) hours as needed.   albuterol (VENTOLIN HFA) 108 (90 Base) MCG/ACT inhaler Inhale 2 puffs into the lungs every 6 (six) hours as needed for wheezing or shortness of breath.   aluminum chloride (DRYSOL) 20 % external solution Apply topically at bedtime.   fluticasone (FLONASE) 50 MCG/ACT nasal spray Place 2 sprays into both nostrils daily.   ketoconazole (NIZORAL) 2 % cream APPLY TO AFFECTED AREA TWICE A DAY   loratadine (CLARITIN) 10 MG tablet Take 1 tablet (10 mg total) by mouth daily.   norethindrone (MICRONOR) 0.35 MG tablet Take 1 tablet (0.35 mg total) by mouth daily.   Naltrexone-buPROPion HCl ER (CONTRAVE) 8-90 MG TB12 Start 1 tablet every morning for 7 days, then 1 tablet twice daily for 7 days, then 2 tablets every morning and one every evening (Patient not taking: Reported on 11/26/2021)   No current facility-administered medications on file prior to visit.    Allergies:  Allergies  Allergen Reactions   Penicillins Swelling    Has patient had a PCN reaction causing immediate rash, facial/tongue/throat swelling, SOB or lightheadedness with hypotension: No Has patient had a PCN reaction causing severe rash involving  mucus membranes or skin necrosis: No Has patient had a PCN reaction that required hospitalization No Has patient had a PCN reaction occurring within the last 10 years: No If all of the above answers are "NO", then may proceed with Cephalosporin use.    Social History:  Social History   Socioeconomic History   Marital status: Single    Spouse name: Not on file   Number of children: Not on file   Years of education: Not on file   Highest education level: Not on file  Occupational History   Not on file  Tobacco Use   Smoking status: Never   Smokeless tobacco: Never  Vaping Use   Vaping Use: Never used   Substance and Sexual Activity   Alcohol use: Not Currently    Comment: RARE   Drug use: No   Sexual activity: Yes    Birth control/protection: Surgical    Comment: Tubal ligation  Other Topics Concern   Not on file  Social History Narrative   Not on file   Social Determinants of Health   Financial Resource Strain: Not on file  Food Insecurity: Not on file  Transportation Needs: Not on file  Physical Activity: Not on file  Stress: Not on file  Social Connections: Not on file  Intimate Partner Violence: Not on file   Social History   Tobacco Use  Smoking Status Never  Smokeless Tobacco Never   Social History   Substance and Sexual Activity  Alcohol Use Not Currently   Comment: RARE    Family History:  Family History  Problem Relation Age of Onset   Hypertension Sister    Hypertension Brother    Hypertension Maternal Grandmother    Diabetes Maternal Grandmother    Hypertension Maternal Grandfather    Hypertension Paternal Grandmother    Hypertension Paternal Grandfather    Cancer Neg Hx    Heart disease Neg Hx    Stroke Neg Hx     Past medical history, surgical history, medications, allergies, family history and social history reviewed with patient today and changes made to appropriate areas of the chart.   Review of Systems  Constitutional: Negative.   HENT: Negative.    Eyes: Negative.   Respiratory: Negative.    Cardiovascular: Negative.   Gastrointestinal: Negative.   Genitourinary: Negative.   Musculoskeletal: Negative.   Skin: Negative.   Neurological: Negative.   Endo/Heme/Allergies:  Positive for environmental allergies. Negative for polydipsia. Does not bruise/bleed easily.  Psychiatric/Behavioral:  Negative for depression, hallucinations, memory loss, substance abuse and suicidal ideas. The patient is nervous/anxious and has insomnia.   All other ROS negative except what is listed above and in the HPI.      Objective:    BP 136/88     Pulse 72    Temp 98.4 F (36.9 C)    Ht '5\' 6"'  (1.676 m)    Wt (!) 320 lb 6.4 oz (145.3 kg)    SpO2 98%    BMI 51.71 kg/m   Wt Readings from Last 3 Encounters:  11/26/21 (!) 320 lb 6.4 oz (145.3 kg)  10/29/21 (!) 320 lb (145.2 kg)  07/18/21 (!) 331 lb 3.2 oz (150.2 kg)    Physical Exam Vitals and nursing note reviewed.  Constitutional:      General: She is not in acute distress.    Appearance: Normal appearance. She is obese. She is not ill-appearing, toxic-appearing or diaphoretic.  HENT:     Head: Normocephalic and atraumatic.  Right Ear: Tympanic membrane, ear canal and external ear normal. There is no impacted cerumen.     Left Ear: Tympanic membrane, ear canal and external ear normal. There is no impacted cerumen.     Nose: Nose normal. No congestion or rhinorrhea.     Mouth/Throat:     Mouth: Mucous membranes are moist.     Pharynx: Oropharynx is clear. No oropharyngeal exudate or posterior oropharyngeal erythema.  Eyes:     General: No scleral icterus.       Right eye: No discharge.        Left eye: No discharge.     Extraocular Movements: Extraocular movements intact.     Conjunctiva/sclera: Conjunctivae normal.     Pupils: Pupils are equal, round, and reactive to light.  Neck:     Vascular: No carotid bruit.  Cardiovascular:     Rate and Rhythm: Normal rate and regular rhythm.     Pulses: Normal pulses.     Heart sounds: No murmur heard.   No friction rub. No gallop.  Pulmonary:     Effort: Pulmonary effort is normal. No respiratory distress.     Breath sounds: Normal breath sounds. No stridor. No wheezing, rhonchi or rales.  Chest:     Chest wall: No tenderness.  Abdominal:     General: Abdomen is flat. Bowel sounds are normal. There is no distension.     Palpations: Abdomen is soft. There is no mass.     Tenderness: There is no abdominal tenderness. There is no right CVA tenderness, left CVA tenderness, guarding or rebound.     Hernia: No hernia is present.   Genitourinary:    Comments: Breast and pelvic exams deferred with shared decision making Musculoskeletal:        General: No swelling, tenderness, deformity or signs of injury.     Cervical back: Normal range of motion and neck supple. No rigidity. No muscular tenderness.     Right lower leg: No edema.     Left lower leg: No edema.  Lymphadenopathy:     Cervical: No cervical adenopathy.  Skin:    General: Skin is warm and dry.     Capillary Refill: Capillary refill takes less than 2 seconds.     Coloration: Skin is not jaundiced or pale.     Findings: No bruising, erythema, lesion or rash.  Neurological:     General: No focal deficit present.     Mental Status: She is alert and oriented to person, place, and time. Mental status is at baseline.     Cranial Nerves: No cranial nerve deficit.     Sensory: No sensory deficit.     Motor: No weakness.     Coordination: Coordination normal.     Gait: Gait normal.     Deep Tendon Reflexes: Reflexes normal.  Psychiatric:        Mood and Affect: Mood normal.        Behavior: Behavior normal.        Thought Content: Thought content normal.        Judgment: Judgment normal.    Results for orders placed or performed in visit on 10/29/21  Microscopic Examination  Result Value Ref Range   WBC, UA 0-5 0 - 5 /hpf   RBC 3-10 (A) 0 - 2 /hpf   Epithelial Cells (non renal) 0-10 0 - 10 /hpf   Mucus, UA Present (A) Not Estab.   Bacteria, UA None seen None seen/Few  WET PREP FOR TRICH, YEAST, CLUE   Urine  Result Value Ref Range   Trichomonas Exam Negative Negative   Yeast Exam Negative Negative   Clue Cell Exam Positive (A) Negative  Urinalysis, Routine w reflex microscopic  Result Value Ref Range   Specific Gravity, UA 1.020 1.005 - 1.030   pH, UA 7.0 5.0 - 7.5   Color, UA Yellow Yellow   Appearance Ur Cloudy (A) Clear   Leukocytes,UA Trace (A) Negative   Protein,UA 1+ (A) Negative/Trace   Glucose, UA Negative Negative   Ketones, UA  Negative Negative   RBC, UA 3+ (A) Negative   Bilirubin, UA Negative Negative   Urobilinogen, Ur 1.0 0.2 - 1.0 mg/dL   Nitrite, UA Negative Negative   Microscopic Examination See below:   Basic metabolic panel  Result Value Ref Range   Glucose 75 70 - 99 mg/dL   BUN 14 6 - 20 mg/dL   Creatinine, Ser 0.75 0.57 - 1.00 mg/dL   eGFR 108 >59 mL/min/1.73   BUN/Creatinine Ratio 19 9 - 23   Sodium 141 134 - 144 mmol/L   Potassium 3.5 3.5 - 5.2 mmol/L   Chloride 101 96 - 106 mmol/L   CO2 25 20 - 29 mmol/L   Calcium 8.7 8.7 - 10.2 mg/dL  Microalbumin, Urine Waived  Result Value Ref Range   Microalb, Ur Waived 150 (H) 0 - 19 mg/L   Creatinine, Urine Waived 100 10 - 300 mg/dL   Microalb/Creat Ratio 30-300 (H) <30 mg/g  Pregnancy, urine  Result Value Ref Range   Preg Test, Ur Negative Negative      Assessment & Plan:   Problem List Items Addressed This Visit       Cardiovascular and Mediastinum   Essential hypertension    Under good control on current regimen. Continue current regimen. Continue to monitor. Call with any concerns. Refills given. Labs drawn today.       Relevant Medications   lisinopril-hydrochlorothiazide (ZESTORETIC) 20-25 MG tablet   Other Visit Diagnoses     Routine general medical examination at a health care facility    -  Primary   Vaccines up to date. Screening labs checked today. Pap up to date. Continue diet and exercise. Call with any concerns. Continue to monitor.    Relevant Orders   CBC with Differential/Platelet   Comprehensive metabolic panel   Lipid Panel w/o Chol/HDL Ratio   Urinalysis, Routine w reflex microscopic   TSH   Microalbumin, Urine Waived        Follow up plan: Return in about 6 months (around 05/27/2022), or OK to cancel appt in january.   LABORATORY TESTING:  - Pap smear: up to date  IMMUNIZATIONS:   - Tdap: Tetanus vaccination status reviewed: last tetanus booster within 10 years. - Influenza: Up to date - Pneumovax:  Not applicable - Prevnar: Not applicable - COVID: Up to date - HPV: Not applicable  PATIENT COUNSELING:   Advised to take 1 mg of folate supplement per day if capable of pregnancy.   Sexuality: Discussed sexually transmitted diseases, partner selection, use of condoms, avoidance of unintended pregnancy  and contraceptive alternatives.   Advised to avoid cigarette smoking.  I discussed with the patient that most people either abstain from alcohol or drink within safe limits (<=14/week and <=4 drinks/occasion for males, <=7/weeks and <= 3 drinks/occasion for females) and that the risk for alcohol disorders and other health effects rises proportionally with the number of drinks per  week and how often a drinker exceeds daily limits.  Discussed cessation/primary prevention of drug use and availability of treatment for abuse.   Diet: Encouraged to adjust caloric intake to maintain  or achieve ideal body weight, to reduce intake of dietary saturated fat and total fat, to limit sodium intake by avoiding high sodium foods and not adding table salt, and to maintain adequate dietary potassium and calcium preferably from fresh fruits, vegetables, and low-fat dairy products.    stressed the importance of regular exercise  Injury prevention: Discussed safety belts, safety helmets, smoke detector, smoking near bedding or upholstery.   Dental health: Discussed importance of regular tooth brushing, flossing, and dental visits.    NEXT PREVENTATIVE PHYSICAL DUE IN 1 YEAR. Return in about 6 months (around 05/27/2022), or OK to cancel appt in january.

## 2021-11-26 NOTE — Assessment & Plan Note (Signed)
Under good control on current regimen. Continue current regimen. Continue to monitor. Call with any concerns. Refills given. Labs drawn today.   

## 2021-11-27 ENCOUNTER — Ambulatory Visit: Payer: Medicaid Other

## 2021-11-27 LAB — COMPREHENSIVE METABOLIC PANEL
ALT: 11 IU/L (ref 0–32)
AST: 14 IU/L (ref 0–40)
Albumin/Globulin Ratio: 1.5 (ref 1.2–2.2)
Albumin: 3.5 g/dL — ABNORMAL LOW (ref 3.8–4.8)
Alkaline Phosphatase: 89 IU/L (ref 44–121)
BUN/Creatinine Ratio: 11 (ref 9–23)
BUN: 7 mg/dL (ref 6–20)
Bilirubin Total: 0.7 mg/dL (ref 0.0–1.2)
CO2: 25 mmol/L (ref 20–29)
Calcium: 8.3 mg/dL — ABNORMAL LOW (ref 8.7–10.2)
Chloride: 104 mmol/L (ref 96–106)
Creatinine, Ser: 0.66 mg/dL (ref 0.57–1.00)
Globulin, Total: 2.3 g/dL (ref 1.5–4.5)
Glucose: 75 mg/dL (ref 70–99)
Potassium: 3.7 mmol/L (ref 3.5–5.2)
Sodium: 142 mmol/L (ref 134–144)
Total Protein: 5.8 g/dL — ABNORMAL LOW (ref 6.0–8.5)
eGFR: 119 mL/min/{1.73_m2} (ref >59)

## 2021-11-27 LAB — CBC WITH DIFFERENTIAL/PLATELET
Basophils Absolute: 0.1 10*3/uL (ref 0.0–0.2)
Basos: 1 %
EOS (ABSOLUTE): 0.1 10*3/uL (ref 0.0–0.4)
Eos: 2 %
Hematocrit: 42.6 % (ref 34.0–46.6)
Hemoglobin: 13.5 g/dL (ref 11.1–15.9)
Immature Grans (Abs): 0 10*3/uL (ref 0.0–0.1)
Immature Granulocytes: 0 %
Lymphocytes Absolute: 1.9 10*3/uL (ref 0.7–3.1)
Lymphs: 27 %
MCH: 27.5 pg (ref 26.6–33.0)
MCHC: 31.7 g/dL (ref 31.5–35.7)
MCV: 87 fL (ref 79–97)
Monocytes Absolute: 0.5 10*3/uL (ref 0.1–0.9)
Monocytes: 7 %
Neutrophils Absolute: 4.6 10*3/uL (ref 1.4–7.0)
Neutrophils: 63 %
Platelets: 273 10*3/uL (ref 150–450)
RBC: 4.91 x10E6/uL (ref 3.77–5.28)
RDW: 13 % (ref 11.7–15.4)
WBC: 7.3 10*3/uL (ref 3.4–10.8)

## 2021-11-27 LAB — LIPID PANEL W/O CHOL/HDL RATIO
Cholesterol, Total: 142 mg/dL (ref 100–199)
HDL: 41 mg/dL (ref >39)
LDL Chol Calc (NIH): 65 mg/dL (ref 0–99)
Triglycerides: 224 mg/dL — ABNORMAL HIGH (ref 0–149)
VLDL Cholesterol Cal: 36 mg/dL (ref 5–40)

## 2021-11-27 LAB — TSH: TSH: 3.28 u[IU]/mL (ref 0.450–4.500)

## 2021-11-28 DIAGNOSIS — Z111 Encounter for screening for respiratory tuberculosis: Secondary | ICD-10-CM | POA: Diagnosis not present

## 2021-11-29 ENCOUNTER — Ambulatory Visit: Payer: BC Managed Care – PPO | Admitting: Family Medicine

## 2021-12-05 ENCOUNTER — Ambulatory Visit: Payer: Medicaid Other

## 2021-12-17 ENCOUNTER — Encounter: Payer: BC Managed Care – PPO | Admitting: Family Medicine

## 2021-12-28 ENCOUNTER — Telehealth: Payer: Medicaid Other

## 2022-01-04 ENCOUNTER — Telehealth (INDEPENDENT_AMBULATORY_CARE_PROVIDER_SITE_OTHER): Payer: Medicaid Other | Admitting: Family Medicine

## 2022-01-04 ENCOUNTER — Encounter: Payer: Self-pay | Admitting: Family Medicine

## 2022-01-04 DIAGNOSIS — J0101 Acute recurrent maxillary sinusitis: Secondary | ICD-10-CM

## 2022-01-04 MED ORDER — FLUCONAZOLE 150 MG PO TABS: 150.0000 mg | ORAL_TABLET | Freq: Every day | ORAL | 0 refills | Status: DC

## 2022-01-04 MED ORDER — DOXYCYCLINE HYCLATE 100 MG PO TABS: 100.0000 mg | ORAL_TABLET | Freq: Two times a day (BID) | ORAL | 0 refills | Status: DC

## 2022-01-04 NOTE — Progress Notes (Signed)
There were no vitals taken for this visit.   Subjective:    Patient ID: Regina Chandler, female    DOB: 03/21/1991, 31 y.o.   MRN: [id number]  HPI: Regina Chandler is a 31 y.o. female  Chief Complaint  Patient presents with   Sore Throat    Patient states she started feeling sick a week ago with sore throat, headache, body aches. Patient took COVID test yesterday and it was negative.    UPPER RESPIRATORY TRACT INFECTION Duration: 2 weeks Worst symptom: sore throat Fever: no Cough: yes Shortness of breath: no Wheezing: yes Chest pain: no Chest tightness: no Chest congestion: yes Nasal congestion: no Runny nose: yes Post nasal drip: yes Sneezing: no Sore throat: yes Swollen glands: no Sinus pressure: yes Headache: no Face pain: yes Toothache: no Ear pain: no  Ear pressure: no  Eyes red/itching:no Eye drainage/crusting: no  Vomiting: no Rash: no Fatigue: yes Sick contacts: yes Strep contacts: no  Context: worse Recurrent sinusitis: no Relief with OTC cold/cough medications: no  Treatments attempted: cold/sinus   Relevant past medical, surgical, family and social history reviewed and updated as indicated. Interim medical history since our last visit reviewed. Allergies and medications reviewed and updated.  Review of Systems  Constitutional: Negative.   HENT:  Positive for congestion, postnasal drip, sinus pressure and sore throat. Negative for dental problem, drooling, ear discharge, ear pain, facial swelling, hearing loss, mouth sores, nosebleeds, rhinorrhea, sinus pain, sneezing, tinnitus, trouble swallowing and voice change.   Respiratory:  Positive for cough and wheezing. Negative for apnea, choking, chest tightness, shortness of breath and stridor.   Cardiovascular: Negative.   Gastrointestinal: Negative.   Psychiatric/Behavioral: Negative.     Per HPI unless specifically indicated above     Objective:    There were no vitals taken for this  visit.  Wt Readings from Last 3 Encounters:  11/26/21 (!) 320 lb 6.4 oz (145.3 kg)  10/29/21 (!) 320 lb (145.2 kg)  07/18/21 (!) 331 lb 3.2 oz (150.2 kg)    Physical Exam Vitals and nursing note reviewed.  Pulmonary:     Effort: Pulmonary effort is normal. No respiratory distress.     Comments: Speaking in full sentences Neurological:     Mental Status: She is alert.  Psychiatric:        Mood and Affect: Mood normal.        Behavior: Behavior normal.        Thought Content: Thought content normal.        Judgment: Judgment normal.    Results for orders placed or performed in visit on 11/26/21  CBC with Differential/Platelet  Result Value Ref Range   WBC 7.3 3.4 - 10.8 x10E3/uL   RBC 4.91 3.77 - 5.28 x10E6/uL   Hemoglobin 13.5 11.1 - 15.9 g/dL   Hematocrit 42.6 34.0 - 46.6 %   MCV 87 79 - 97 fL   MCH 27.5 26.6 - 33.0 pg   MCHC 31.7 31.5 - 35.7 g/dL   RDW 13.0 11.7 - 15.4 %   Platelets 273 150 - 450 x10E3/uL   Neutrophils 63 Not Estab. %   Lymphs 27 Not Estab. %   Monocytes 7 Not Estab. %   Eos 2 Not Estab. %   Basos 1 Not Estab. %   Neutrophils Absolute 4.6 1.4 - 7.0 x10E3/uL   Lymphocytes Absolute 1.9 0.7 - 3.1 x10E3/uL   Monocytes Absolute 0.5 0.1 - 0.9 x10E3/uL   EOS (ABSOLUTE)  0.1 0.0 - 0.4 x10E3/uL   Basophils Absolute 0.1 0.0 - 0.2 x10E3/uL   Immature Granulocytes 0 Not Estab. %   Immature Grans (Abs) 0.0 0.0 - 0.1 x10E3/uL  Comprehensive metabolic panel  Result Value Ref Range   Glucose 75 70 - 99 mg/dL   BUN 7 6 - 20 mg/dL   Creatinine, Ser 0.66 0.57 - 1.00 mg/dL   eGFR 119 >59 mL/min/1.73   BUN/Creatinine Ratio 11 9 - 23   Sodium 142 134 - 144 mmol/L   Potassium 3.7 3.5 - 5.2 mmol/L   Chloride 104 96 - 106 mmol/L   CO2 25 20 - 29 mmol/L   Calcium 8.3 (L) 8.7 - 10.2 mg/dL   Total Protein 5.8 (L) 6.0 - 8.5 g/dL   Albumin 3.5 (L) 3.8 - 4.8 g/dL   Globulin, Total 2.3 1.5 - 4.5 g/dL   Albumin/Globulin Ratio 1.5 1.2 - 2.2   Bilirubin Total 0.7 0.0 - 1.2  mg/dL   Alkaline Phosphatase 89 44 - 121 IU/L   AST 14 0 - 40 IU/L   ALT 11 0 - 32 IU/L  Lipid Panel w/o Chol/HDL Ratio  Result Value Ref Range   Cholesterol, Total 142 100 - 199 mg/dL   Triglycerides 224 (H) 0 - 149 mg/dL   HDL 41 >39 mg/dL   VLDL Cholesterol Cal 36 5 - 40 mg/dL   LDL Chol Calc (NIH) 65 0 - 99 mg/dL  Urinalysis, Routine w reflex microscopic  Result Value Ref Range   Specific Gravity, UA 1.025 1.005 - 1.030   pH, UA 6.0 5.0 - 7.5   Color, UA Yellow Yellow   Appearance Ur Clear Clear   Leukocytes,UA Negative Negative   Protein,UA Trace (A) Negative/Trace   Glucose, UA Negative Negative   Ketones, UA Trace (A) Negative   RBC, UA Negative Negative   Bilirubin, UA Negative Negative   Urobilinogen, Ur 1.0 0.2 - 1.0 mg/dL   Nitrite, UA Negative Negative  TSH  Result Value Ref Range   TSH 3.280 0.450 - 4.500 uIU/mL  Microalbumin, Urine Waived  Result Value Ref Range   Microalb, Ur Waived 30 (H) 0 - 19 mg/L   Creatinine, Urine Waived 200 10 - 300 mg/dL   Microalb/Creat Ratio <30 <30 mg/g      Assessment & Plan:   Problem List Items Addressed This Visit   None Visit Diagnoses     Acute recurrent maxillary sinusitis    -  Primary   Will treat with doxycycline. Call if not getting better or getting worse. Continue to monitor.    Relevant Medications   doxycycline (VIBRA-TABS) 100 MG tablet   fluconazole (DIFLUCAN) 150 MG tablet        Follow up plan: Return if symptoms worsen or fail to improve.    This visit was completed via telephone due to the restrictions of the COVID-19 pandemic. All issues as above were discussed and addressed but no physical exam was performed. If it was felt that the patient should be evaluated in the office, they were directed there. The patient verbally consented to this visit. Patient was unable to complete an audio/visual visit due to Lack of equipment. Due to the catastrophic nature of the COVID-19 pandemic, this visit was  done through audio contact only. Location of the patient: work Location of the provider: work Those involved with this call:  Provider: Park Liter, DO CMA: Louanna Raw, CMA Front Desk/Registration: [employer]  Time spent on call:  21 minutes on the phone discussing health concerns. 30 minutes total spent in review of patient's record and preparation of their chart.

## 2022-03-04 ENCOUNTER — Emergency Department
Admission: EM | Admit: 2022-03-04 | Discharge: 2022-03-04 | Disposition: A | Discharge status: Home / Self Care | Payer: Medicaid Other | Attending: Student in an Organized Health Care Education/Training Program | Admitting: Student in an Organized Health Care Education/Training Program

## 2022-03-04 ENCOUNTER — Other Ambulatory Visit: Payer: Self-pay

## 2022-03-04 DIAGNOSIS — J020 Streptococcal pharyngitis: Secondary | ICD-10-CM | POA: Diagnosis present

## 2022-03-04 DIAGNOSIS — J029 Acute pharyngitis, unspecified: Secondary | ICD-10-CM

## 2022-03-04 MED ORDER — CEPHALEXIN 500 MG PO CAPS: 500.0000 mg | ORAL_CAPSULE | Freq: Two times a day (BID) | ORAL | 0 refills | Status: AC

## 2022-03-04 NOTE — ED Notes (Signed)
Pt d/c home per MD order. Discharge summary reviewed, pt verbalizes understanding. Ambulatory off unit with children, no s/s of acute distress noted at discharge.  ?

## 2022-03-04 NOTE — ED Triage Notes (Signed)
Pt reports bringing her daughter here Saturday and dx with strep throat, states her and her 3 other children have been c/o since then ?

## 2022-03-04 NOTE — ED Notes (Signed)
See triage note  presents with low grade temp,body aches and sore throat  sx's started yesterday    her daughter was dx'd with strep this weekend ?

## 2022-03-04 NOTE — Discharge Instructions (Addendum)
Take Keflex twice daily for the next 7 days. ?

## 2022-03-04 NOTE — ED Provider Notes (Signed)
? ?[facility] ?Provider Note ? ?Patient Contact: 10:12 PM (approximate) ? ? ?History  ? ?Sore Throat ? ? ?HPI ? ?Regina Chandler is a 31 y.o. female presents for treatment for strep throat.  Patient has multiple sick contacts in the home that currently have strep throat.  She is managing her own secretions and speaking in complete sentences. ? ?  ? ? ?Physical Exam  ? ?Triage Vital Signs: ?ED Triage Vitals  ?Enc Vitals Group  ?   BP 03/04/22 1750 (!) 151/110  ?   Pulse Rate 03/04/22 1750 72  ?   Resp 03/04/22 1750 17  ?   Temp 03/04/22 1750 99 ?F (37.2 ?C)  ?   Temp Source 03/04/22 1750 Oral  ?   SpO2 03/04/22 1750 99 %  ?   Weight 03/04/22 1805 (!) 320 lb 5.3 oz (145.3 kg)  ?   Height 03/04/22 1805 5\' 6"  (1.676 m)  ?   Head Circumference --   ?   Peak Flow --   ?   Pain Score 03/04/22 1828 8  ?   Pain Loc --   ?   Pain Edu? --   ?   Excl. in GC? --   ? ? ?Most recent vital signs: ?Vitals:  ? 03/04/22 1750  ?BP: (!) 151/110  ?Pulse: 72  ?Resp: 17  ?Temp: 99 ?F (37.2 ?C)  ?SpO2: 99%  ? ? ? ?General: Alert and in no acute distress. ?Eyes:  PERRL. EOMI. ?Head: No acute traumatic findings ?ENT: ?     Ears:  ?     Nose: No congestion/rhinnorhea. ?     Mouth/Throat: Mucous membranes are moist.  Posterior pharynx erythematous. ?Neck: No stridor. No cervical spine tenderness to palpation. ?Cardiovascular:  Good peripheral perfusion ?Respiratory: Normal respiratory effort without tachypnea or retractions. Lungs CTAB. Good air entry to the bases with no decreased or absent breath sounds. ?Skin:   No rash noted ?Other: ? ? ?ED Results / Procedures / Treatments  ? ?Labs ?(all labs ordered are listed, but only abnormal results are displayed) ?Labs Reviewed - No data to display ? ? ? ? ? ? ? ?PROCEDURES: ? ?Critical Care performed: No ? ?Procedures ? ? ?MEDICATIONS ORDERED IN ED: ?Medications - No data to display ? ? ?IMPRESSION / MDM / ASSESSMENT AND PLAN / ED COURSE  ?I reviewed the triage vital signs  and the nursing notes. ?             ?               ? ?Assessment and plan: ?Pharyngitis: ? ?Differential diagnosis includes, but is not limited to, strep throat versus viral pharyngitis ? ?31 year old female with unremarkable past medical history presents to the emergency department for group A strep treatment.  She was discharged with Keflex given penicillin allergy.  Return precautions were given to return with new or worsening symptoms. ?  ? ? ?FINAL CLINICAL IMPRESSION(S) / ED DIAGNOSES  ? ?Final diagnoses:  ?Pharyngitis, unspecified etiology  ? ? ? ?Rx / DC Orders  ? ?ED Discharge Orders   ? ?      Ordered  ?  cephALEXin (KEFLEX) 500 MG capsule  2 times daily       ? 03/04/22 1820  ? ?  ?  ? ?  ? ? ? ?Note:  This document was prepared using Dragon voice recognition software and may include unintentional dictation errors. ?  ?03/06/22 M,  PA-C ?03/04/22 2214 ? ?  ?Chesley Noon, MD ?03/07/22 1043 ? ?

## 2022-03-05 ENCOUNTER — Telehealth: Payer: Self-pay

## 2022-03-05 NOTE — Telephone Encounter (Signed)
Transition Care Management Unsuccessful Follow-up Telephone Call ? ?Date of discharge and from where:  03/04/2022-[facility] ? ?Attempts:  1st Attempt ? ?Reason for unsuccessful TCM follow-up call:  Left voice message ? ?  ?

## 2022-03-06 NOTE — Telephone Encounter (Signed)
Transition Care Management Unsuccessful Follow-up Telephone Call ? ?Date of discharge and from where:  03/04/2022-[facility]  ? ?Attempts:  2nd Attempt ? ?Reason for unsuccessful TCM follow-up call:  Left voice message ? ?  ?

## 2022-03-07 NOTE — Telephone Encounter (Signed)
Transition Care Management Unsuccessful Follow-up Telephone Call ? ?Date of discharge and from where:  03/04/2022 from [facility] ? ?Attempts:  3rd Attempt ? ?Reason for unsuccessful TCM follow-up call:  Unable to reach patient ? ? ? ?

## 2022-03-10 ENCOUNTER — Other Ambulatory Visit: Payer: Self-pay

## 2022-03-10 ENCOUNTER — Emergency Department: Payer: Medicaid Other

## 2022-03-10 ENCOUNTER — Emergency Department
Admission: EM | Admit: 2022-03-10 | Discharge: 2022-03-10 | Disposition: A | Discharge status: Home / Self Care | Payer: Medicaid Other | Attending: Emergency Medicine | Admitting: Emergency Medicine

## 2022-03-10 ENCOUNTER — Encounter: Payer: Self-pay | Admitting: Emergency Medicine

## 2022-03-10 DIAGNOSIS — Z79899 Other long term (current) drug therapy: Secondary | ICD-10-CM | POA: Diagnosis not present

## 2022-03-10 DIAGNOSIS — R0602 Shortness of breath: Secondary | ICD-10-CM | POA: Diagnosis not present

## 2022-03-10 DIAGNOSIS — R079 Chest pain, unspecified: Secondary | ICD-10-CM | POA: Diagnosis not present

## 2022-03-10 DIAGNOSIS — R0789 Other chest pain: Secondary | ICD-10-CM | POA: Insufficient documentation

## 2022-03-10 DIAGNOSIS — I10 Essential (primary) hypertension: Secondary | ICD-10-CM | POA: Diagnosis not present

## 2022-03-10 LAB — HEPATIC FUNCTION PANEL
ALT: 17 U/L (ref 0–44)
AST: 15 U/L (ref 15–41)
Albumin: 3.6 g/dL (ref 3.5–5.0)
Alkaline Phosphatase: 103 U/L (ref 38–126)
Bilirubin, Direct: <0.1 mg/dL (ref 0.0–0.2)
Total Bilirubin: 0.5 mg/dL (ref 0.3–1.2)
Total Protein: 7.6 g/dL (ref 6.5–8.1)

## 2022-03-10 LAB — BASIC METABOLIC PANEL
Anion gap: 7 (ref 5–15)
BUN: 10 mg/dL (ref 6–20)
CO2: 26 mmol/L (ref 22–32)
Calcium: 8.7 mg/dL — ABNORMAL LOW (ref 8.9–10.3)
Chloride: 104 mmol/L (ref 98–111)
Creatinine, Ser: 0.85 mg/dL (ref 0.44–1.00)
GFR, Estimated: >60 mL/min (ref >60)
Glucose, Bld: 108 mg/dL — ABNORMAL HIGH (ref 70–99)
Potassium: 3.3 mmol/L — ABNORMAL LOW (ref 3.5–5.1)
Sodium: 137 mmol/L (ref 135–145)

## 2022-03-10 LAB — CBC
HCT: 36.7 % (ref 36.0–46.0)
Hemoglobin: 11.4 g/dL — ABNORMAL LOW (ref 12.0–15.0)
MCH: 26.8 pg (ref 26.0–34.0)
MCHC: 31.1 g/dL (ref 30.0–36.0)
MCV: 86.2 fL (ref 80.0–100.0)
Platelets: 287 10*3/uL (ref 150–400)
RBC: 4.26 MIL/uL (ref 3.87–5.11)
RDW: 12.9 % (ref 11.5–15.5)
WBC: 7 10*3/uL (ref 4.0–10.5)
nRBC: 0 % (ref 0.0–0.2)

## 2022-03-10 LAB — TROPONIN I (HIGH SENSITIVITY)
Troponin I (High Sensitivity): 6 ng/L (ref <18)
Troponin I (High Sensitivity): 4 ng/L (ref <18)

## 2022-03-10 LAB — POC URINE PREG, ED: Preg Test, Ur: NEGATIVE

## 2022-03-10 LAB — LIPASE, BLOOD: Lipase: 35 U/L (ref 11–51)

## 2022-03-10 MED ORDER — PANTOPRAZOLE SODIUM 40 MG PO TBEC
40.0000 mg | DELAYED_RELEASE_TABLET | Freq: Once | ORAL | Status: AC
  Administered 2022-03-10: 40 mg via ORAL
  Filled 2022-03-10: qty 1

## 2022-03-10 MED ORDER — LIDOCAINE VISCOUS HCL 2 % MT SOLN
15.0000 mL | Freq: Once | OROMUCOSAL | Status: AC
  Administered 2022-03-10: 15 mL via ORAL
  Filled 2022-03-10: qty 15

## 2022-03-10 MED ORDER — LIDOCAINE VISCOUS HCL 2 % MT SOLN: 15.0000 mL | Freq: Four times a day (QID) | OROMUCOSAL | 0 refills | Status: DC | PRN

## 2022-03-10 MED ORDER — ALUM & MAG HYDROXIDE-SIMETH 200-200-20 MG/5ML PO SUSP
30.0000 mL | Freq: Once | ORAL | Status: AC
  Administered 2022-03-10: 30 mL via ORAL
  Filled 2022-03-10: qty 30

## 2022-03-10 MED ORDER — PANTOPRAZOLE SODIUM 40 MG PO TBEC: 40.0000 mg | DELAYED_RELEASE_TABLET | Freq: Every day | ORAL | 1 refills | Status: DC

## 2022-03-10 NOTE — ED Triage Notes (Signed)
Pt to ED via POV with c/o substernal CP that started at 2130 yesterday. Pt states pain is like a tightness, radiates to her back. Pt states attempted position changes and water without relief of pain.  ?

## 2022-03-10 NOTE — Discharge Instructions (Addendum)
Please avoid NSAIDs such as aspirin (Goody powders), ibuprofen (Motrin, Advil), naproxen (Aleve) as these may worsen your symptoms.  Tylenol 1000 mg every 6 hours is safe to take as long as you have no history of liver problems (heavy alcohol use, cirrhosis, hepatitis).  Please avoid spicy, acidic (citrus fruits, tomato based sauces, salsa), greasy, fatty foods.  Please avoid caffeine and alcohol.  Smoking can also make GERD/acid reflux worse.  Over the counter medications such as TUMS, Maalox or Mylanta, pepcid, Prilosec or Nexium may help with your symptoms.  Do not take Prilosec or Nexium if you are already prescribed a proton pump inhibitor.  

## 2022-03-10 NOTE — ED Provider Notes (Signed)
? ?[facility] ?Provider Note ? ? ? Event Date/Time  ? First MD Initiated Contact with Patient 03/10/22 0557   ?  (approximate) ? ? ?History  ? ?Chest Pain ? ? ?HPI ? ?Regina Chandler is a 31 y.o. female with history of obesity, bipolar disorder, GERD, hypertension who presents to the emergency department with complaints of central chest pressure with radiation into her back that started at 9:30 PM last night.  States she tried drinking water, soda and could not get comfortable.  States she did get short of breath but thinks that she was panicking.  No nausea, vomiting, fevers, cough, diaphoresis or dizziness.  No history of CAD.  No history of PE, DVT, exogenous estrogen use, recent fractures, surgery, trauma, hospitalization, prolonged travel or other immobilization. No lower extremity swelling or pain. No calf tenderness.  States she ate food at a cookout last night.  Denies abdominal pain. ? ? ?History provided by patient. ? ? ? ?Past Medical History:  ?Diagnosis Date  ? Anemia   ? Anxiety   ? Bipolar 1 disorder (HCC)   ? Depression   ? GERD (gastroesophageal reflux disease)   ? NO MEDS  ? Headache   ? Hepatitis B affecting pregnancy 2007  ? No current treatment required, titers show up negative  ? Hypertension   ? ? ?Past Surgical History:  ?Procedure Laterality Date  ? LAPAROSCOPIC TUBAL LIGATION N/A 07/30/2018  ? Procedure: LAPAROSCOPIC TUBAL LIGATION;  Surgeon: Vena AustriaStaebler, Andreas, MD;  Location: [facility] ORS;  Service: Gynecology;  Laterality: N/A;  ? NO PAST SURGERIES    ? NO PAST SURGERIES    ? TUBAL LIGATION  07/30/2018  ? [facility](Staebler)  ? ? ?MEDICATIONS:  ?Prior to Admission medications   ?Medication Sig Start Date End Date Taking? Authorizing Provider  ?acetaminophen (TYLENOL) 500 MG tablet Take 1 tablet (500 mg total) by mouth every 6 (six) hours as needed. 03/06/20   Enid DerryWagner, Ashley, PA-C  ?albuterol (VENTOLIN HFA) 108 (90 Base) MCG/ACT inhaler Inhale 2 puffs into the lungs every  6 (six) hours as needed for wheezing or shortness of breath. 08/09/20   Orvil FeilWoods, Jaclyn M, PA-C  ?aluminum chloride (DRYSOL) 20 % external solution Apply topically at bedtime. 08/06/19   Particia NearingLane, Rachel Elizabeth, PA-C  ?cephALEXin (KEFLEX) 500 MG capsule Take 1 capsule (500 mg total) by mouth 2 (two) times daily for 7 days. 03/04/22 03/11/22  Orvil FeilWoods, Jaclyn M, PA-C  ?fluconazole (DIFLUCAN) 150 MG tablet Take 1 tablet (150 mg total) by mouth daily. Take 1 when you finish your antibiotics 01/04/22   Olevia PerchesJohnson, Megan P, DO  ?fluticasone (FLONASE) 50 MCG/ACT nasal spray Place 2 sprays into both nostrils daily. 10/18/20   Aura Dialsannady, Jolene T, NP  ?ketoconazole (NIZORAL) 2 % cream APPLY TO AFFECTED AREA TWICE A DAY 12/17/19   Particia NearingLane, Rachel Elizabeth, PA-C  ?lisinopril-hydrochlorothiazide (ZESTORETIC) 20-25 MG tablet Take 1 tablet by mouth daily. 11/26/21   Olevia PerchesJohnson, Megan P, DO  ?loratadine (CLARITIN) 10 MG tablet Take 1 tablet (10 mg total) by mouth daily. 10/18/20   Marjie Skiffannady, Jolene T, NP  ?Naltrexone-buPROPion HCl ER (CONTRAVE) 8-90 MG TB12 Start 1 tablet every morning for 7 days, then 1 tablet twice daily for 7 days, then 2 tablets every morning and one every evening 10/29/21   Laural BenesJohnson, Megan P, DO  ?norethindrone (MICRONOR) 0.35 MG tablet Take 1 tablet (0.35 mg total) by mouth daily. 02/13/21   Nadara MustardHarris, Robert P, MD  ? ? ?Physical Exam  ? ?  Triage Vital Signs: ?ED Triage Vitals  ?Enc Vitals Group  ?   BP 03/10/22 0332 (!) 170/102  ?   Pulse Rate 03/10/22 0332 77  ?   Resp 03/10/22 0332 18  ?   Temp 03/10/22 0332 98.3 ?F (36.8 ?C)  ?   Temp Source 03/10/22 0332 Oral  ?   SpO2 03/10/22 0332 100 %  ?   Weight --   ?   Height --   ?   Head Circumference --   ?   Peak Flow --   ?   Pain Score 03/10/22 0330 10  ?   Pain Loc --   ?   Pain Edu? --   ?   Excl. in GC? --   ? ? ?Most recent vital signs: ?Vitals:  ? 03/10/22 0539 03/10/22 0630  ?BP: 135/86 135/78  ?Pulse: 71 (!) 56  ?Resp: 18 20  ?Temp:    ?SpO2: 100% 100%  ? ? ?CONSTITUTIONAL: Alert  and oriented and responds appropriately to questions. Well-appearing; well-nourished ?HEAD: Normocephalic, atraumatic ?EYES: Conjunctivae clear, pupils appear equal, sclera nonicteric ?ENT: normal nose; moist mucous membranes ?NECK: Supple, normal ROM ?CARD: RRR; S1 and S2 appreciated; no murmurs, no clicks, no rubs, no gallops ?RESP: Normal chest excursion without splinting or tachypnea; breath sounds clear and equal bilaterally; no wheezes, no rhonchi, no rales, no hypoxia or respiratory distress, speaking full sentences ?ABD/GI: Normal bowel sounds; non-distended; soft, non-tender, no rebound, no guarding, no peritoneal signs ?BACK: The back appears normal ?EXT: Normal ROM in all joints; no deformity noted, no edema; no cyanosis, no calf tenderness or calf swelling ?SKIN: Normal color for age and race; warm; no rash on exposed skin ?NEURO: Moves all extremities equally, normal speech ?PSYCH: The patient's mood and manner are appropriate. ? ? ?ED Results / Procedures / Treatments  ? ?LABS: ?(all labs ordered are listed, but only abnormal results are displayed) ?Labs Reviewed  ?BASIC METABOLIC PANEL - Abnormal; Notable for the following components:  ?    Result Value  ? Potassium 3.3 (*)   ? Glucose, Bld 108 (*)   ? Calcium 8.7 (*)   ? All other components within normal limits  ?CBC - Abnormal; Notable for the following components:  ? Hemoglobin 11.4 (*)   ? All other components within normal limits  ?HEPATIC FUNCTION PANEL  ?LIPASE, BLOOD  ?POC URINE PREG, ED  ?TROPONIN I (HIGH SENSITIVITY)  ?TROPONIN I (HIGH SENSITIVITY)  ? ? ? ?EKG: ? EKG Interpretation ? ?Date/Time:  Sunday March 10 2022 03:26:41 EDT ?Ventricular Rate:  74 ?PR Interval:  168 ?QRS Duration: 92 ?QT Interval:  412 ?QTC Calculation: 457 ?R Axis:   60 ?Text Interpretation: Normal sinus rhythm Normal ECG When compared with ECG of 27-Jul-2018 09:02, No significant change was found Confirmed by Rochele Raring [phone]) on 03/10/2022 7:18:14 AM ?  ? ?   ? ? ? ?RADIOLOGY: ?My personal review and interpretation of imaging: Chest x-ray clear. ? ?I have personally reviewed all radiology reports.   ?DG Chest 2 View ? ?Result Date: 03/10/2022 ?CLINICAL DATA:  Chest pain EXAM: CHEST - 2 VIEW COMPARISON:  01/08/2020 FINDINGS: The heart size and mediastinal contours are within normal limits. Both lungs are clear. The visualized skeletal structures are unremarkable. IMPRESSION: Negative. Electronically Signed   By: Charlett Nose M.D.   On: 03/10/2022 03:47   ? ? ?PROCEDURES: ? ?Critical Care performed: No ? ? ?CRITICAL CARE ?Performed by: Baxter Hire Heylee Tant ? ? ?Total critical  care time: 0 minutes ? ?Critical care time was exclusive of separately billable procedures and treating other patients. ? ?Critical care was necessary to treat or prevent imminent or life-threatening deterioration. ? ?Critical care was time spent personally by me on the following activities: development of treatment plan with patient and/or surrogate as well as nursing, discussions with consultants, evaluation of patient's response to treatment, examination of patient, obtaining history from patient or surrogate, ordering and performing treatments and interventions, ordering and review of laboratory studies, ordering and review of radiographic studies, pulse oximetry and re-evaluation of patient's condition. ? ? ?.1-3 Lead EKG Interpretation ?Performed by: Lamari Youngers, Layla Maw, DO ?Authorized by: Yanet Balliet, Layla Maw, DO  ? ?  Interpretation: normal   ?  ECG rate:  65 ?  ECG rate assessment: normal   ?  Rhythm: sinus rhythm   ?  Ectopy: none   ?  Conduction: normal   ? ? ? ?IMPRESSION / MDM / ASSESSMENT AND PLAN / ED COURSE  ?I reviewed the triage vital signs and the nursing notes. ? ? ?Patient here with chest pain, shortness of breath.  Shortness of breath has resolved but still continues to have chest pressure.  No abdominal pain. ? ?The patient is on the cardiac monitor to evaluate for evidence of arrhythmia and/or  significant heart rate changes. ? ? ?DIFFERENTIAL DIAGNOSIS (includes but not limited to):   ACS, PE, dissection, CHF, GERD, esophageal spasm, esophagitis, pneumothorax, pneumonia ? ? ?PLAN: We will obtain CB

## 2022-03-12 NOTE — Telephone Encounter (Signed)
Transition Care Management Unsuccessful Follow-up Telephone Call ? ?Date of discharge and from where:  03/10/2022-[facility] ? ?Attempts:  2nd Attempt ? ?Reason for unsuccessful TCM follow-up call:  Voice mail full ? ?  ?

## 2022-03-13 NOTE — Telephone Encounter (Signed)
Transition Care Management Unsuccessful Follow-up Telephone Call ? ?Date of discharge and from where:  03/10/2022-[facility] ? ?Attempts:  3rd Attempt ? ?Reason for unsuccessful TCM follow-up call:  Voice mail full ? ?  ?

## 2022-05-03 DIAGNOSIS — H5213 Myopia, bilateral: Secondary | ICD-10-CM | POA: Diagnosis not present

## 2022-05-27 ENCOUNTER — Ambulatory Visit: Payer: Medicaid Other | Admitting: Family Medicine

## 2022-06-14 ENCOUNTER — Telehealth: Payer: Medicaid Other | Admitting: Family Medicine

## 2022-06-14 ENCOUNTER — Ambulatory Visit (INDEPENDENT_AMBULATORY_CARE_PROVIDER_SITE_OTHER): Payer: Medicaid Other | Admitting: Family Medicine

## 2022-06-14 ENCOUNTER — Encounter: Payer: Self-pay | Admitting: Family Medicine

## 2022-06-14 VITALS — BP 134/84 | HR 71 | Temp 97.9°F | Wt 305.2 lb

## 2022-06-14 DIAGNOSIS — B372 Candidiasis of skin and nail: Secondary | ICD-10-CM | POA: Diagnosis not present

## 2022-06-14 DIAGNOSIS — I83813 Varicose veins of bilateral lower extremities with pain: Secondary | ICD-10-CM

## 2022-06-14 DIAGNOSIS — I10 Essential (primary) hypertension: Secondary | ICD-10-CM

## 2022-06-14 DIAGNOSIS — J302 Other seasonal allergic rhinitis: Secondary | ICD-10-CM

## 2022-06-14 MED ORDER — LEVOCETIRIZINE DIHYDROCHLORIDE 5 MG PO TABS: 5.0000 mg | ORAL_TABLET | Freq: Every evening | ORAL | 3 refills | Status: DC

## 2022-06-14 MED ORDER — NYSTATIN 100000 UNIT/GM EX POWD: 1.0000 | Freq: Three times a day (TID) | CUTANEOUS | 0 refills | Status: DC

## 2022-06-14 MED ORDER — LISINOPRIL-HYDROCHLOROTHIAZIDE 20-25 MG PO TABS: 1.0000 | ORAL_TABLET | Freq: Every day | ORAL | 1 refills | Status: DC

## 2022-06-14 NOTE — Progress Notes (Signed)
BP 134/84   Pulse 71   Temp 97.9 F (36.6 C)   Wt (!) 305 lb 3.2 oz (138.4 kg)   SpO2 97%   BMI 49.26 kg/m    Subjective:    Patient ID: Regina Chandler, female    DOB: [birth year], 31 y.o.   MRN: [id number]  HPI: Regina Chandler is a 31 y.o. female  Chief Complaint  Patient presents with   skin concern    Patient states she redness, and itching under both her breast. Patient states she has tried something OTC for it that helps but area still feels raw.    URI    Patient states she is having pressure behind her eyes and headaches. Patient states her eyes are itchy as well.    RASH Duration:  weeks  Location: under breasts  Itching: no Burning: yes Redness: yes Oozing: no Scaling: no Blisters: no Painful: no Fevers: no Change in detergents/soaps/personal care products: no Recent illness: no Recent travel:no History of same: yes Context: worse Alleviating factors: nothing Treatments attempted:lotion/moisturizer Shortness of breath: no  Throat/tongue swelling: no Myalgias/arthralgias: no  UPPER RESPIRATORY TRACT INFECTION Duration: weeks Worst symptom: congestion itchy eyes Fever: no Cough: no Shortness of breath: no Wheezing: no Chest pain: no Chest tightness: no Chest congestion: no Nasal congestion: yes Runny nose: yes Post nasal drip: yes Sneezing: yes Sore throat: no Swollen glands: no Sinus pressure: yes Headache: no Face pain: no Toothache: no Ear pain: no  Ear pressure: no  Eyes red/itching:yes Eye drainage/crusting: no  Vomiting: no Rash: no Fatigue: no Sick contacts: no Strep contacts: no  Context: stable Recurrent sinusitis: no Relief with OTC cold/cough medications: no  Treatments attempted: claritin, flonase   HYPERTENSION Hypertension status: controlled  Satisfied with current treatment? yes Duration of hypertension: chronic BP monitoring frequency:  not checking BP medication side effects:  no Medication compliance:  excellent compliance Previous BP meds:lisinpril hctz Aspirin: no Recurrent headaches: yes Visual changes: no Palpitations: no Dyspnea: no Chest pain: no Lower extremity edema: no Dizzy/lightheaded: no  Relevant past medical, surgical, family and social history reviewed and updated as indicated. Interim medical history since our last visit reviewed. Allergies and medications reviewed and updated.  Review of Systems  Constitutional: Negative.   HENT: Negative.    Respiratory: Negative.    Cardiovascular: Negative.   Gastrointestinal: Negative.   Musculoskeletal: Negative.   Psychiatric/Behavioral: Negative.      Per HPI unless specifically indicated above     Objective:    BP 134/84   Pulse 71   Temp 97.9 F (36.6 C)   Wt (!) 305 lb 3.2 oz (138.4 kg)   SpO2 97%   BMI 49.26 kg/m   Wt Readings from Last 3 Encounters:  06/14/22 (!) 305 lb 3.2 oz (138.4 kg)  03/04/22 (!) 320 lb 5.3 oz (145.3 kg)  11/26/21 (!) 320 lb 6.4 oz (145.3 kg)    Physical Exam Vitals and nursing note reviewed.  Constitutional:      General: She is not in acute distress.    Appearance: Normal appearance. She is not ill-appearing, toxic-appearing or diaphoretic.  HENT:     Head: Normocephalic and atraumatic.     Right Ear: External ear normal.     Left Ear: External ear normal.     Nose: Nose normal.     Mouth/Throat:     Mouth: Mucous membranes are moist.     Pharynx: Oropharynx is clear.  Eyes:  General: No scleral icterus.       Right eye: No discharge.        Left eye: No discharge.     Extraocular Movements: Extraocular movements intact.     Conjunctiva/sclera: Conjunctivae normal.     Pupils: Pupils are equal, round, and reactive to light.  Cardiovascular:     Rate and Rhythm: Normal rate and regular rhythm.     Pulses: Normal pulses.     Heart sounds: Normal heart sounds. No murmur heard.    No friction rub. No gallop.  Pulmonary:     Effort: Pulmonary effort is normal. No  respiratory distress.     Breath sounds: Normal breath sounds. No stridor. No wheezing, rhonchi or rales.  Chest:     Chest wall: No tenderness.  Musculoskeletal:        General: Normal range of motion.     Cervical back: Normal range of motion and neck supple.  Skin:    General: Skin is warm and dry.     Capillary Refill: Capillary refill takes less than 2 seconds.     Coloration: Skin is not jaundiced or pale.     Findings: Erythema (under breasts) present. No bruising, lesion or rash.  Neurological:     General: No focal deficit present.     Mental Status: She is alert and oriented to person, place, and time. Mental status is at baseline.  Psychiatric:        Mood and Affect: Mood normal.        Behavior: Behavior normal.        Thought Content: Thought content normal.        Judgment: Judgment normal.     Results for orders placed or performed in visit on 33/00/76  Basic metabolic panel  Result Value Ref Range   Glucose 86 70 - 99 mg/dL   BUN 9 6 - 20 mg/dL   Creatinine, Ser 0.78 0.57 - 1.00 mg/dL   eGFR 103 >59 mL/min/1.73   BUN/Creatinine Ratio 12 9 - 23   Sodium 138 134 - 144 mmol/L   Potassium 3.5 3.5 - 5.2 mmol/L   Chloride 104 96 - 106 mmol/L   CO2 21 20 - 29 mmol/L   Calcium 9.2 8.7 - 10.2 mg/dL      Assessment & Plan:   Problem List Items Addressed This Visit       Cardiovascular and Mediastinum   Essential hypertension    Under good control on current regimen. Continue current regimen. Continue to monitor. Call with any concerns. Refills given. Labs drawn today.        Relevant Medications   lisinopril-hydrochlorothiazide (ZESTORETIC) 20-25 MG tablet   Other Relevant Orders   Basic metabolic panel (Completed)   Other Visit Diagnoses     Candidal intertrigo    -  Primary   Will treat with nystatin. Call if not getting better or getting worse.    Relevant Medications   nystatin (MYCOSTATIN/NYSTOP) powder   Varicose veins of both lower extremities  with pain       Will start compression hose. Referral to vascular made. Call with any concerns.    Relevant Medications   lisinopril-hydrochlorothiazide (ZESTORETIC) 20-25 MG tablet   Other Relevant Orders   Ambulatory referral to Vascular Surgery   Seasonal allergic rhinitis, unspecified trigger       Will change to xyzal. Call with any concerns. Continue to monitor.         Follow up  plan: No follow-ups on file.

## 2022-06-15 LAB — BASIC METABOLIC PANEL
BUN/Creatinine Ratio: 12 (ref 9–23)
BUN: 9 mg/dL (ref 6–20)
CO2: 21 mmol/L (ref 20–29)
Calcium: 9.2 mg/dL (ref 8.7–10.2)
Chloride: 104 mmol/L (ref 96–106)
Creatinine, Ser: 0.78 mg/dL (ref 0.57–1.00)
Glucose: 86 mg/dL (ref 70–99)
Potassium: 3.5 mmol/L (ref 3.5–5.2)
Sodium: 138 mmol/L (ref 134–144)
eGFR: 103 mL/min/{1.73_m2} (ref >59)

## 2022-06-17 NOTE — Progress Notes (Signed)
Hi Regina Chandler.  Your lab work looks good.  Potassium and Calcium have improved.  Continue to follow up per Dr. Henriette Combs recommendations.

## 2022-06-23 ENCOUNTER — Encounter: Payer: Self-pay | Admitting: Family Medicine

## 2022-06-23 NOTE — Assessment & Plan Note (Signed)
Under good control on current regimen. Continue current regimen. Continue to monitor. Call with any concerns. Refills given. Labs drawn today.   

## 2022-07-17 ENCOUNTER — Encounter (INDEPENDENT_AMBULATORY_CARE_PROVIDER_SITE_OTHER): Payer: Self-pay

## 2022-08-13 ENCOUNTER — Other Ambulatory Visit (INDEPENDENT_AMBULATORY_CARE_PROVIDER_SITE_OTHER): Payer: Self-pay | Admitting: Nurse Practitioner

## 2022-08-13 DIAGNOSIS — I83813 Varicose veins of bilateral lower extremities with pain: Secondary | ICD-10-CM

## 2022-08-14 ENCOUNTER — Encounter (INDEPENDENT_AMBULATORY_CARE_PROVIDER_SITE_OTHER): Payer: Medicaid Other

## 2022-08-14 ENCOUNTER — Encounter (INDEPENDENT_AMBULATORY_CARE_PROVIDER_SITE_OTHER): Payer: Medicaid Other | Admitting: Nurse Practitioner

## 2022-08-15 ENCOUNTER — Telehealth: Payer: Self-pay

## 2022-08-15 NOTE — Telephone Encounter (Signed)
Copied from CRM [id number]. Topic: General - Other >> Aug 15, 2022  2:23 PM Everette C wrote: Reason for CRM: The patient will be coming to drop papers off at the practice tomorrow 08/16/22 related to their previous physical   Please contact the patient further if needed

## 2022-08-16 ENCOUNTER — Other Ambulatory Visit: Payer: Self-pay

## 2022-08-16 ENCOUNTER — Encounter: Payer: Self-pay | Admitting: Emergency Medicine

## 2022-08-16 ENCOUNTER — Emergency Department
Admission: EM | Admit: 2022-08-16 | Discharge: 2022-08-16 | Disposition: A | Discharge status: Home / Self Care | Payer: Medicaid Other | Attending: Emergency Medicine | Admitting: Emergency Medicine

## 2022-08-16 DIAGNOSIS — R21 Rash and other nonspecific skin eruption: Secondary | ICD-10-CM | POA: Insufficient documentation

## 2022-08-16 MED ORDER — CLINDAMYCIN HCL 150 MG PO CAPS: 300.0000 mg | ORAL_CAPSULE | Freq: Three times a day (TID) | ORAL | 0 refills | Status: DC

## 2022-08-16 MED ORDER — PREDNISONE 10 MG (21) PO TBPK: ORAL_TABLET | ORAL | 0 refills | Status: DC

## 2022-08-16 NOTE — Discharge Instructions (Signed)
Follow-up with your regular doctor if not improving in 3 days.  Return emergency department worsening.  He may use Benadryl cream if needed. Use CeraVe face wash and moisturizer Take the antibiotic and steroid as prescribed

## 2022-08-16 NOTE — ED Notes (Signed)
First Nurse Note Pt reports she developed a rash on her face about 3 days ago, reports rash localized only to her face .

## 2022-08-16 NOTE — ED Triage Notes (Signed)
Pt in via POV, reports rash to face since Wednesday; states rash is not in any other location.  Declines using any new topical to face.  Reports using benadryl cream, other itch reliever and allergy pill without any relief.  Vitals WDL, NAD noted at this time.

## 2022-08-16 NOTE — ED Provider Notes (Signed)
[facility] Provider Note    Event Date/Time   First MD Initiated Contact with Patient 08/16/22 2046     (approximate)   History   Rash   HPI  Regina Chandler is a 31 y.o. female presents emergency department complaining of a rash on her face since Wednesday.  States its only on her face.  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  She denies any new exposures.  No history of acne.  No hormone issues.  Patient states she has tried to use over-the-counter medications with no relief.      Physical Exam   Triage Vital Signs: ED Triage Vitals  Enc Vitals Group     BP 08/16/22 1957 (!) 170/103     Pulse Rate 08/16/22 1957 73     Resp 08/16/22 1957 20     Temp 08/16/22 1957 98.5 F (36.9 C)     Temp Source 08/16/22 1957 Oral     SpO2 08/16/22 1957 96 %     Weight 08/16/22 2009 (!) 304 lb (137.9 kg)     Height 08/16/22 2009 5\' 6"  (1.676 m)     Head Circumference --      Peak Flow --      Pain Score 08/16/22 2008 0     Pain Loc --      Pain Edu? --      Excl. in GC? --     Most recent vital signs: Vitals:   08/16/22 1957  BP: (!) 170/103  Pulse: 73  Resp: 20  Temp: 98.5 F (36.9 C)  SpO2: 96%     General: Awake, no distress.   CV:  Good peripheral perfusion. regular rate and  rhythm Resp:  Normal effort.  Abd:  No distention.   Other:  Face with multiple hard cystic-like bumps noted on the face cheeks jawline and forehead, no pustules or vesicles noted, it is on both sides of the face   ED Results / Procedures / Treatments   Labs (all labs ordered are listed, but only abnormal results are displayed) Labs Reviewed - No data to display   EKG     RADIOLOGY     PROCEDURES:   Procedures   MEDICATIONS ORDERED IN ED: Medications - No data to display   IMPRESSION / MDM / ASSESSMENT AND PLAN / ED COURSE  I reviewed the triage vital signs and the nursing notes.                              Differential  diagnosis includes, but is not limited to, contact dermatitis, folliculitis, cystic acne, MRSA  Patient's presentation is most consistent with acute, uncomplicated illness.   The areas look more like a cystic acne, although she does have quite a bit of itching with this area.  Placed her on a Sterapred Dosepak to help with the itching and inflammation, also placed her on clindamycin 3 times daily for 7 days.  She is to follow-up with [facility] sooner if not improving in 2 to 3 days.  Return emergency department worsening.  Patient is agreement treatment plan.  Discharged stable condition.      FINAL CLINICAL IMPRESSION(S) / ED DIAGNOSES   Final diagnoses:  Rash     Rx / DC Orders   ED Discharge Orders          Ordered    predniSONE (STERAPRED UNI-PAK 21  TAB) 10 MG (21) TBPK tablet        08/16/22 2054    clindamycin (CLEOCIN) 150 MG capsule  3 times daily        08/16/22 2054             Note:  This document was prepared using Dragon voice recognition software and may include unintentional dictation errors.    Faythe Ghee, PA-C 08/16/22 2101    Minna Antis, MD 08/16/22 [phone]

## 2022-08-20 ENCOUNTER — Telehealth (INDEPENDENT_AMBULATORY_CARE_PROVIDER_SITE_OTHER): Payer: Medicaid Other | Admitting: Nurse Practitioner

## 2022-08-20 ENCOUNTER — Other Ambulatory Visit: Payer: Self-pay | Admitting: Nurse Practitioner

## 2022-08-20 ENCOUNTER — Encounter: Payer: Self-pay | Admitting: Nurse Practitioner

## 2022-08-20 DIAGNOSIS — R21 Rash and other nonspecific skin eruption: Secondary | ICD-10-CM | POA: Diagnosis not present

## 2022-08-20 MED ORDER — DESONIDE 0.05 % EX CREA: TOPICAL_CREAM | Freq: Two times a day (BID) | CUTANEOUS | 1 refills | Status: DC

## 2022-08-20 NOTE — Assessment & Plan Note (Signed)
Ongoing, recommend she complete current abx and steroid regimen.  Will add on Desowen for facial rash and recommend she return in one week for follow-up in office for face to face visit.  She agrees to this plan.

## 2022-08-20 NOTE — Progress Notes (Signed)
LMP  (LMP Unknown)    Subjective:    Patient ID: Regina Chandler, female    DOB: Apr 13, 1991, 31 y.o.   MRN: [id number]  HPI: Regina Chandler is a 31 y.o. female  Chief Complaint  Patient presents with   Skin Irritation    Pt states she has been having skin irritation on her face recently. States she went to the ER and was given Prednisone and an antibiotic. States she is still taking both of these medications.    This visit was completed via telephone due to the restrictions of the COVID-19 pandemic. All issues as above were discussed and addressed but no physical exam was performed. If it was felt that the patient should be evaluated in the office, they were directed there. The patient verbally consented to this visit. Patient was unable to complete an audio/visual visit due to telephone only, as sound on video not working. Due to the catastrophic nature of the COVID-19 pandemic, this visit was done through audio contact only. Location of the patient: home Location of the provider: work Those involved with this call:  Provider: Marnee Guarneri, DNP CMA: Irena Reichmann, [place] Desk/Registration: [employer]  Time spent on call:  21 minutes on the phone discussing health concerns. 15 minutes total spent in review of patient's record and preparation of their chart.  I verified patient identity using two factors (patient name and date of birth). Patient consents verbally to being seen via telemedicine visit today.    RASH Presents today for rash issues, itching and burning sensation.  Went to [facility] 08/16/22, was provided abx and steroids.  Rash is still present, but area is more dry.  Has not been sick recently. Duration:  weeks  Location: face  Itching: yes Burning: yes Redness: yes Oozing: no Scaling: no Blisters: no Painful: no Fevers: no Change in detergents/soaps/personal care products: no Recent illness: no Recent travel:no History of same: no Context:  fluctuating Alleviating factors: nothing Treatments attempted: Clindamycin and steroids Shortness of breath: no  Throat/tongue swelling: no Myalgias/arthralgias: no   Relevant past medical, surgical, family and social history reviewed and updated as indicated. Interim medical history since our last visit reviewed. Allergies and medications reviewed and updated.  Review of Systems  Constitutional:  Negative for activity change, appetite change, diaphoresis, fatigue and fever.  Respiratory:  Negative for cough, chest tightness and shortness of breath.   Cardiovascular:  Negative for chest pain, palpitations and leg swelling.  Skin:  Positive for rash.  Psychiatric/Behavioral: Negative.     Per HPI unless specifically indicated above     Objective:    LMP  (LMP Unknown)   Wt Readings from Last 3 Encounters:  08/16/22 (!) 304 lb (137.9 kg)  06/14/22 (!) 305 lb 3.2 oz (138.4 kg)  03/04/22 (!) 320 lb 5.3 oz (145.3 kg)    Physical Exam  Unable to assess due to telephone visit only.  Results for orders placed or performed in visit on 00/86/76  Basic metabolic panel  Result Value Ref Range   Glucose 86 70 - 99 mg/dL   BUN 9 6 - 20 mg/dL   Creatinine, Ser 0.78 0.57 - 1.00 mg/dL   eGFR 103 >59 mL/min/1.73   BUN/Creatinine Ratio 12 9 - 23   Sodium 138 134 - 144 mmol/L   Potassium 3.5 3.5 - 5.2 mmol/L   Chloride 104 96 - 106 mmol/L   CO2 21 20 - 29 mmol/L   Calcium 9.2 8.7 -  10.2 mg/dL      Assessment & Plan:   Problem List Items Addressed This Visit       Musculoskeletal and Integument   Rash    Ongoing, recommend she complete current abx and steroid regimen.  Will add on Desowen for facial rash and recommend she return in one week for follow-up in office for face to face visit.  She agrees to this plan.        Follow up plan: Return in about 1 week (around 08/27/2022) for Rash.

## 2022-08-20 NOTE — Progress Notes (Signed)
Lvm asking patient to call back to schedule an appointment 

## 2022-08-20 NOTE — Patient Instructions (Signed)
Rash, Adult  A rash is a change in the color of your skin. A rash can also change the way your skin feels. There are many different conditions and factors that can cause a rash. Follow these instructions at home: The goal of treatment is to stop the itching and keep the rash from spreading. Watch for any changes in your symptoms. Let your doctor know about them. Follow these instructions to help with your condition: Medicine Take or apply over-the-counter and prescription medicines only as told by your doctor. These may include medicines: To treat red or swollen skin (corticosteroid creams). To treat itching. To treat an allergy (oral antihistamines). To treat very bad symptoms (oral corticosteroids).  Skin care Put cool cloths (compresses) on the affected areas. Do not scratch or rub your skin. Avoid covering the rash. Make sure that the rash is exposed to air as much as possible. Managing itching and discomfort Avoid hot showers or baths. These can make itching worse. A cold shower may help. Try taking a bath with: Epsom salts. You can get these at your local pharmacy or grocery store. Follow the instructions on the package. Baking soda. Pour a small amount into the bath as told by your doctor. Colloidal oatmeal. You can get this at your local pharmacy or grocery store. Follow the instructions on the package. Try putting baking soda paste onto your skin. Stir water into baking soda until it gets like a paste. Try putting on a lotion that relieves itchiness (calamine lotion). Keep cool and out of the sun. Sweating and being hot can make itching worse. General instructions  Rest as needed. Drink enough fluid to keep your pee (urine) pale yellow. Wear loose-fitting clothing. Avoid scented soaps, detergents, and perfumes. Use gentle soaps, detergents, perfumes, and other cosmetic products. Avoid anything that causes your rash. Keep a journal to help track what causes your rash. Write  down: What you eat. What cosmetic products you use. What you drink. What you wear. This includes jewelry. Keep all follow-up visits as told by your doctor. This is important. Contact a doctor if: You sweat at night. You lose weight. You pee (urinate) more than normal. You pee less than normal, or you notice that your pee is a darker color than normal. You feel weak. You throw up (vomit). Your skin or the whites of your eyes look yellow (jaundice). Your skin: Tingles. Is numb. Your rash: Does not go away after a few days. Gets worse. You are: More thirsty than normal. More tired than normal. You have: New symptoms. Pain in your belly (abdomen). A fever. Watery poop (diarrhea). Get help right away if: You have a fever and your symptoms suddenly get worse. You start to feel mixed up (confused). You have a very bad headache or a stiff neck. You have very bad joint pains or stiffness. You have jerky movements that you cannot control (seizure). Your rash covers all or most of your body. The rash may or may not be painful. You have blisters that: Are on top of the rash. Grow larger. Grow together. Are painful. Are inside your nose or mouth. You have a rash that: Looks like purple pinprick-sized spots all over your body. Has a "bull's eye" or looks like a target. Is red and painful, causes your skin to peel, and is not from being in the sun too long. Summary A rash is a change in the color of your skin. A rash can also change the way your skin   feels. The goal of treatment is to stop the itching and keep the rash from spreading. Take or apply over-the-counter and prescription medicines only as told by your doctor. Contact a doctor if you have new symptoms or symptoms that get worse. Keep all follow-up visits as told by your doctor. This is important. This information is not intended to replace advice given to you by your health care provider. Make sure you discuss any  questions you have with your health care provider. Document Revised: 09/06/2021 Document Reviewed: 09/06/2021 Elsevier Patient Education  2023 Elsevier Inc.  

## 2022-08-21 NOTE — Telephone Encounter (Signed)
Requested medication (s) are due for refill today: No  Requested medication (s) are on the active medication list: Yes  Last refill:  08/20/22  Future visit scheduled: No  Notes to clinic:  Pharmacy requests prior authorization    Requested Prescriptions  Pending Prescriptions Disp Refills   desonide (DESOWEN) 0.05 % cream [Pharmacy Med Name: DESONIDE 0.05% CREAM] 30 g 1    Sig: Apply topically 2 (two) times daily. Use only a small amount.     Not Delegated - Dermatology:  Corticosteroids Failed - 08/20/2022  2:13 PM      Failed - This refill cannot be delegated      Passed - Valid encounter within last 12 months    Recent Outpatient Visits           Yesterday Rash   [facility] [place], Burney T, NP   2 months ago Candidal intertrigo   [facility] [place], [place], DO   7 months ago Acute recurrent maxillary sinusitis   [facility] [place], Megan P, DO   8 months ago Routine general medical examination at a health care facility   [facility], [place] P, DO   9 months ago Essential hypertension   [facility] [place], [place], DO

## 2022-08-22 MED ORDER — TRIAMCINOLONE ACETONIDE 0.025 % EX CREA: 1.0000 | TOPICAL_CREAM | Freq: Two times a day (BID) | CUTANEOUS | 0 refills | Status: DC

## 2022-08-22 NOTE — Progress Notes (Signed)
2nd attempt to reach patient.

## 2022-08-22 NOTE — Addendum Note (Signed)
Addended by: Aura Dials T on: 08/22/2022 12:08 PM   Modules accepted: Orders

## 2022-08-26 NOTE — Telephone Encounter (Signed)
Patient has not dropped off any paperwork to be completed.

## 2022-09-11 ENCOUNTER — Encounter (INDEPENDENT_AMBULATORY_CARE_PROVIDER_SITE_OTHER): Payer: Medicaid Other

## 2022-09-11 ENCOUNTER — Encounter (INDEPENDENT_AMBULATORY_CARE_PROVIDER_SITE_OTHER): Payer: Medicaid Other | Admitting: Nurse Practitioner

## 2022-09-15 ENCOUNTER — Emergency Department
Admission: EM | Admit: 2022-09-15 | Discharge: 2022-09-15 | Disposition: A | Discharge status: Home / Self Care | Payer: Medicaid Other | Attending: Emergency Medicine | Admitting: Emergency Medicine

## 2022-09-15 ENCOUNTER — Other Ambulatory Visit: Payer: Self-pay

## 2022-09-15 ENCOUNTER — Encounter: Payer: Self-pay | Admitting: Emergency Medicine

## 2022-09-15 DIAGNOSIS — J0110 Acute frontal sinusitis, unspecified: Secondary | ICD-10-CM | POA: Insufficient documentation

## 2022-09-15 DIAGNOSIS — H6991 Unspecified Eustachian tube disorder, right ear: Secondary | ICD-10-CM | POA: Diagnosis not present

## 2022-09-15 DIAGNOSIS — I10 Essential (primary) hypertension: Secondary | ICD-10-CM | POA: Insufficient documentation

## 2022-09-15 DIAGNOSIS — R059 Cough, unspecified: Secondary | ICD-10-CM | POA: Diagnosis present

## 2022-09-15 DIAGNOSIS — H6981 Other specified disorders of Eustachian tube, right ear: Secondary | ICD-10-CM | POA: Diagnosis not present

## 2022-09-15 MED ORDER — PREDNISONE 10 MG (21) PO TBPK: ORAL_TABLET | ORAL | 0 refills | Status: DC

## 2022-09-15 NOTE — ED Triage Notes (Signed)
Pt reports has been battling sinus pressure and pain, cough and congestion for the past 2 weeks with OTC meds but isn't getting better and now has pain and pressure to her right ear.

## 2022-09-15 NOTE — ED Provider Notes (Signed)
[facility] Provider Note    Event Date/Time   First MD Initiated Contact with Patient 09/15/22 1132     (approximate)   History   Otalgia, Nasal Congestion, and Cough   HPI  Regina Chandler is a 31 y.o. female with history of hypertension, anemia, and as listed in the EMR presents to the emergency department for treatment and evaluation of sinus pressure for the past couple of weeks and has developed pain and pressure to the right ear over the past couple days.  Symptoms not relieved with over-the-counter medications.  No fever.     Physical Exam   Triage Vital Signs: ED Triage Vitals  Enc Vitals Group     BP 09/15/22 1023 137/76     Pulse Rate 09/15/22 1023 69     Resp 09/15/22 1023 20     Temp 09/15/22 1023 98.9 F (37.2 C)     Temp Source 09/15/22 1023 Oral     SpO2 09/15/22 1023 96 %     Weight 09/15/22 1022 (!) 302 lb 0.5 oz (137 kg)     Height 09/15/22 1022 5\' 6"  (1.676 m)     Head Circumference --      Peak Flow --      Pain Score 09/15/22 1021 0     Pain Loc --      Pain Edu? --      Excl. in GC? --     Most recent vital signs: Vitals:   09/15/22 1023  BP: 137/76  Pulse: 69  Resp: 20  Temp: 98.9 F (37.2 C)  SpO2: 96%     General: Awake, no distress.  CV:  Good peripheral perfusion.  Resp:  Normal effort.  Abd:  No distention.  Other:  Serous otitis media on the right.  Diffuse sinus pressure.   ED Results / Procedures / Treatments   Labs (all labs ordered are listed, but only abnormal results are displayed) Labs Reviewed - No data to display   EKG  Not indicated   RADIOLOGY   PROCEDURES:  Critical Care performed: No  Procedures   MEDICATIONS ORDERED IN ED: Medications - No data to display   IMPRESSION / MDM / ASSESSMENT AND PLAN / ED COURSE  I reviewed the triage vital signs and the nursing notes.                              Differential diagnosis includes, but is not limited to, sinus  infection, sinusitis, ear infection, eustachian tube dysfunction.  Patient's presentation is most consistent with acute, uncomplicated illness.  31 year old female presenting to the emergency department for treatment and evaluation of sinus pressure and decreased hearing and pain in the right ear.  See HPI for further details.  Exam and vital signs are reassuring.  Plan will be to treat her with a tapered dose of prednisone and have her follow-up with her primary care provider if not improving over the week.  If symptoms change or worsen and she is unable to see primary care, she is to return to the emergency department.     FINAL CLINICAL IMPRESSION(S) / ED DIAGNOSES   Final diagnoses:  Acute non-recurrent frontal sinusitis  Dysfunction of right eustachian tube     Rx / DC Orders   ED Discharge Orders          Ordered    predniSONE (STERAPRED UNI-PAK 21 TAB) 10  MG (21) TBPK tablet        09/15/22 1202             Note:  This document was prepared using Dragon voice recognition software and may include unintentional dictation errors.   Victorino Dike, FNP 09/15/22 1226    Duffy Bruce, MD 09/17/22 [phone]

## 2022-09-16 ENCOUNTER — Telehealth: Payer: Self-pay

## 2022-09-16 NOTE — Patient Outreach (Signed)
Care Coordination  09/16/2022  ALLANNA BRESEE [birth year] [phone]   Transition Care Management Unsuccessful Follow-up Telephone Call  Date of discharge and from where:  09/15/22 [facility]  Attempts:  1st Attempt  Reason for unsuccessful TCM follow-up call:  Left voice message

## 2022-09-17 ENCOUNTER — Telehealth: Payer: Self-pay

## 2022-09-17 NOTE — Patient Outreach (Signed)
Care Coordination  09/17/2022  Regina Chandler Jan 17, 1991 [id number]   Transition Care Management Unsuccessful Follow-up Telephone Call  Date of discharge and from where:  09/15/22 [facility]  Attempts:  2nd Attempt  Reason for unsuccessful TCM follow-up call:  Left voice message   Mickel Fuchs, [place], [place] Medicaid Team  [phone]

## 2022-09-18 ENCOUNTER — Telehealth: Payer: Self-pay

## 2022-09-18 NOTE — Patient Outreach (Signed)
Care Coordination  09/18/2022  SUMAIYAH MARKERT October 21, 1991 [id number]   Transition Care Management Unsuccessful Follow-up Telephone Call  Date of discharge and from where:  09/15/22 [facility]   Attempts:  3rd Attempt  Reason for unsuccessful TCM follow-up call:  Left voice message   Mickel Fuchs, [place], [place] Medicaid Team  [phone]

## 2022-09-27 ENCOUNTER — Encounter (INDEPENDENT_AMBULATORY_CARE_PROVIDER_SITE_OTHER): Payer: Medicaid Other | Admitting: Nurse Practitioner

## 2022-09-27 ENCOUNTER — Encounter (INDEPENDENT_AMBULATORY_CARE_PROVIDER_SITE_OTHER): Payer: Medicaid Other

## 2022-10-07 ENCOUNTER — Encounter (INDEPENDENT_AMBULATORY_CARE_PROVIDER_SITE_OTHER): Payer: Self-pay

## 2022-11-17 ENCOUNTER — Emergency Department
Admission: EM | Admit: 2022-11-17 | Discharge: 2022-11-17 | Disposition: A | Discharge status: Home / Self Care | Payer: Medicaid Other | Attending: Emergency Medicine | Admitting: Emergency Medicine

## 2022-11-17 ENCOUNTER — Other Ambulatory Visit: Payer: Self-pay

## 2022-11-17 DIAGNOSIS — J069 Acute upper respiratory infection, unspecified: Secondary | ICD-10-CM

## 2022-11-17 DIAGNOSIS — I10 Essential (primary) hypertension: Secondary | ICD-10-CM | POA: Insufficient documentation

## 2022-11-17 DIAGNOSIS — Z1152 Encounter for screening for COVID-19: Secondary | ICD-10-CM | POA: Diagnosis not present

## 2022-11-17 DIAGNOSIS — R519 Headache, unspecified: Secondary | ICD-10-CM | POA: Diagnosis present

## 2022-11-17 LAB — RESP PANEL BY RT-PCR (RSV, FLU A&B, COVID)  RVPGX2
Influenza A by PCR: NEGATIVE
Influenza B by PCR: NEGATIVE
Resp Syncytial Virus by PCR: NEGATIVE
SARS Coronavirus 2 by RT PCR: NEGATIVE

## 2022-11-17 MED ORDER — ACETAMINOPHEN 325 MG PO TABS
650.0000 mg | ORAL_TABLET | Freq: Once | ORAL | Status: AC
  Administered 2022-11-17: 650 mg via ORAL
  Filled 2022-11-17: qty 2

## 2022-11-17 NOTE — ED Notes (Signed)
Patient declined discharge vital signs. 

## 2022-11-17 NOTE — ED Triage Notes (Signed)
Pt to ED also to have 2 kids seen, all are sick. Pt began yesterday with HA body aches and congestion. Endorses cough. 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

## 2022-11-17 NOTE — Discharge Instructions (Signed)
We will call you with the results of your COVID/flu/RSV test or you can see the results in the same time on MyChart.  In the meantime, he may continue to take Tylenol/ibuprofen per package instructions for fever or bodyaches.  Please return for any new, worsening, or change in symptoms or other concerns.  It was a pleasure caring for you today. 

## 2022-11-17 NOTE — ED Provider Notes (Signed)
[facility] Provider Note    Event Date/Time   First MD Initiated Contact with Patient 11/17/22 1415     (approximate)   History   Headache, Nasal Congestion, and Generalized Body Aches   HPI  Regina Chandler is a 31 y.o. female with a past medical history of obesity, hypertension who presents today for evaluation of cough, nasal congestion, and bodyaches for the past 2 days.  Patient presents with her 2 children who are sick with the same symptoms.  She reports that none of them got a flu shot this year.  Patient has not taken any Tylenol or ibuprofen today.  She denies chest pain or shortness of breath.  She has not had any abdominal pain, nausea, vomiting, or diarrhea.  Patient Active Problem List   Diagnosis Date Noted   Rash 08/20/2022   Abnormal uterine bleeding 01/18/2021   Numbness and tingling in both hands 09/20/2020   BMI 50.0-59.9, adult (HCC) 11/23/2019   Essential hypertension    Bilateral leg edema 09/07/2018   Morbid obesity (HCC) 09/07/2018   Back pain affecting pregnancy in second trimester 12/19/2017   Chronic hepatitis B affecting antepartum care of mother [facility]) 12/19/2017          Physical Exam   Triage Vital Signs: ED Triage Vitals  Enc Vitals Group     BP 11/17/22 1353 (!) 148/106     Pulse Rate 11/17/22 1353 78     Resp 11/17/22 1353 16     Temp 11/17/22 1353 98.6 F (37 C)     Temp Source 11/17/22 1353 Oral     SpO2 11/17/22 1353 100 %     Weight 11/17/22 1354 (!) 307 lb (139.3 kg)     Height 11/17/22 1354 5\' 6"  (1.676 m)     Head Circumference --      Peak Flow --      Pain Score 11/17/22 1353 8     Pain Loc --      Pain Edu? --      Excl. in GC? --     Most recent vital signs: Vitals:   11/17/22 1353  BP: (!) 148/106  Pulse: 78  Resp: 16  Temp: 98.6 F (37 C)  SpO2: 100%    Physical Exam Vitals and nursing note reviewed.  Constitutional:      General: Awake and alert. No acute distress.     Appearance: Normal appearance. The patient is obese.  HENT:     Head: Normocephalic and atraumatic.     Mouth: Mucous membranes are moist. Uvula midline.  No tonsillar exudate.  No soft palate fluctuance.  No trismus.  No voice change.  No sublingual swelling.  No tender cervical lymphadenopathy.  No nuchal rigidity Eyes:     General: PERRL. Normal EOMs        Right eye: No discharge.        Left eye: No discharge.     Conjunctiva/sclera: Conjunctivae normal.  Cardiovascular:     Rate and Rhythm: Normal rate and regular rhythm.     Pulses: Normal pulses.  Pulmonary:     Effort: Pulmonary effort is normal. No respiratory distress.     Breath sounds: Normal breath sounds.  Speaking easily in complete sentences Abdominal:     Abdomen is soft. There is no abdominal tenderness. No rebound or guarding. No distention. Musculoskeletal:        General: No swelling. Normal range of motion.  Cervical back: Normal range of motion and neck supple.  No lymphadenopathy, no nuchal rigidity Skin:    General: Skin is warm and dry.     Capillary Refill: Capillary refill takes less than 2 seconds.     Findings: No rash.  Neurological:     Mental Status: The patient is awake and alert.      ED Results / Procedures / Treatments   Labs (all labs ordered are listed, but only abnormal results are displayed) Labs Reviewed  RESP PANEL BY RT-PCR (RSV, FLU A&B, COVID)  [id number]  RESP PANEL BY RT-PCR (FLU A&B, COVID) [id number]     EKG     RADIOLOGY     PROCEDURES:  Critical Care performed:   Procedures   MEDICATIONS ORDERED IN ED: Medications  acetaminophen (TYLENOL) tablet 650 mg (650 mg Oral Given 11/17/22 1434)     IMPRESSION / MDM / ASSESSMENT AND PLAN / ED COURSE  I reviewed the triage vital signs and the nursing notes.   Differential diagnosis includes, but is not limited to, influenza, COVID-19, bronchitis, pneumonia, other URI.  Patient is awake and alert, hemodynamically  stable and afebrile.  She is afebrile without taking any antipyretics.  She has a normal oxygen saturation of 100% on room air.  Lungs are clear to auscultation bilaterally, do not suspect pneumonia.  Abdomen is soft and nontender throughout, do not suspect intra-abdominal infection.  COVID/flu/RSV swab obtained.  I suspect viral infection given that her kids are sick with the same exact symptoms.  She was given the option of waiting for the result versus being called with the results, and she would prefer to be called with the results.  We discussed return precautions and outpatient follow-up.  Patient understands and agrees with plan.  She was discharged in stable condition.   Patient's presentation is most consistent with acute complicated illness / injury requiring diagnostic workup.    FINAL CLINICAL IMPRESSION(S) / ED DIAGNOSES   Final diagnoses:  Upper respiratory tract infection, unspecified type     Rx / DC Orders   ED Discharge Orders     None        Note:  This document was prepared using Dragon voice recognition software and may include unintentional dictation errors.   Jackelyn Hoehn, PA-C 11/17/22 1500    Jene Every, MD 11/17/22 [phone]

## 2022-11-20 ENCOUNTER — Telehealth: Payer: Self-pay | Admitting: *Deleted

## 2022-11-20 NOTE — Patient Outreach (Signed)
  Care Coordination TOC Note Transition Care Management Follow-up Telephone Call Date of discharge and from where: 11/17/22 from [facility] How have you been since you were released from the hospital? "I am doing ok" Any questions or concerns? No  Items Reviewed: Did the pt receive and understand the discharge instructions provided? Yes  Medications obtained and verified?  No new medications prescribed Other? No  Any new allergies since your discharge? No  Dietary orders reviewed? No Do you have support at home? Yes   Home Care and Equipment/Supplies: Were home health services ordered? no If so, what is the name of the agency? N/A  Has the agency set up a time to come to the patient's home? not applicable Were any new equipment or medical supplies ordered?  No What is the name of the medical supply agency? N/A Were you able to get the supplies/equipment? not applicable Do you have any questions related to the use of the equipment or supplies? No  Functional Questionnaire: (I = Independent and D = Dependent) ADLs: I  Bathing/Dressing- I  Meal Prep- I  Eating- I  Maintaining continence- I  Transferring/Ambulation- I  Managing Meds- I  Follow up appointments reviewed:  PCP Hospital f/u appt confirmed?  N/A, ED visit  . Specialist Hospital f/u appt confirmed?  N/A, ED visit  . Are transportation arrangements needed? No  If their condition worsens, is the pt aware to call PCP or go to the Emergency Dept.? Yes Was the patient provided with contact information for the PCP's office or ED? No Was to pt encouraged to call back with questions or concerns? Yes   Estanislado Emms RN, BSN [place]  [employer]

## 2023-03-10 ENCOUNTER — Encounter: Payer: Medicaid Other | Admitting: Family Medicine

## 2023-03-27 ENCOUNTER — Encounter: Payer: Medicaid Other | Admitting: Family Medicine

## 2023-05-09 ENCOUNTER — Encounter: Payer: Self-pay | Admitting: Family Medicine

## 2023-05-12 ENCOUNTER — Encounter: Payer: Self-pay | Admitting: Family Medicine

## 2023-05-12 ENCOUNTER — Ambulatory Visit (INDEPENDENT_AMBULATORY_CARE_PROVIDER_SITE_OTHER): Payer: 59 | Admitting: Family Medicine

## 2023-05-12 VITALS — BP 138/83 | HR 66 | Temp 98.4°F | Ht 66.0 in | Wt 310.6 lb

## 2023-05-12 DIAGNOSIS — M25561 Pain in right knee: Secondary | ICD-10-CM | POA: Diagnosis not present

## 2023-05-12 DIAGNOSIS — M25562 Pain in left knee: Secondary | ICD-10-CM | POA: Diagnosis not present

## 2023-05-12 DIAGNOSIS — B379 Candidiasis, unspecified: Secondary | ICD-10-CM | POA: Diagnosis not present

## 2023-05-12 DIAGNOSIS — I10 Essential (primary) hypertension: Secondary | ICD-10-CM

## 2023-05-12 DIAGNOSIS — M79602 Pain in left arm: Secondary | ICD-10-CM | POA: Diagnosis not present

## 2023-05-12 DIAGNOSIS — Z13220 Encounter for screening for lipoid disorders: Secondary | ICD-10-CM | POA: Diagnosis not present

## 2023-05-12 DIAGNOSIS — N898 Other specified noninflammatory disorders of vagina: Secondary | ICD-10-CM

## 2023-05-12 LAB — URINALYSIS, ROUTINE W REFLEX MICROSCOPIC
Bilirubin, UA: NEGATIVE
Glucose, UA: NEGATIVE
Ketones, UA: NEGATIVE
Nitrite, UA: NEGATIVE
RBC, UA: NEGATIVE
Specific Gravity, UA: 1.02 (ref 1.005–1.030)
Urobilinogen, Ur: 1 mg/dL (ref 0.2–1.0)
pH, UA: 6.5 (ref 5.0–7.5)

## 2023-05-12 LAB — MICROALBUMIN, URINE WAIVED
Creatinine, Urine Waived: 100 mg/dL (ref 10–300)
Microalb, Ur Waived: 80 mg/L — ABNORMAL HIGH (ref 0–19)

## 2023-05-12 LAB — WET PREP FOR TRICH, YEAST, CLUE
Clue Cell Exam: NEGATIVE
Trichomonas Exam: NEGATIVE
Yeast Exam: POSITIVE — AB

## 2023-05-12 LAB — MICROSCOPIC EXAMINATION

## 2023-05-12 MED ORDER — FLUCONAZOLE 150 MG PO TABS: 150.0000 mg | ORAL_TABLET | Freq: Once | ORAL | 0 refills | Status: AC

## 2023-05-12 MED ORDER — NAPROXEN 500 MG PO TABS: 500.0000 mg | ORAL_TABLET | Freq: Two times a day (BID) | ORAL | 3 refills | Status: DC

## 2023-05-12 MED ORDER — LISINOPRIL-HYDROCHLOROTHIAZIDE 20-25 MG PO TABS: 1.0000 | ORAL_TABLET | Freq: Every day | ORAL | 1 refills | Status: DC

## 2023-05-12 NOTE — Progress Notes (Signed)
BP 138/83   Pulse 66   Temp 98.4 F (36.9 C) (Oral)   Ht 5\' 6"  (1.676 m)   Wt (!) 310 lb 9.6 oz (140.9 kg)   SpO2 98%   BMI 50.13 kg/m    Subjective:    Patient ID: Regina Chandler, female    DOB: 12-25-1990, 32 y.o.   MRN: [id number]  HPI: Regina Chandler is a 32 y.o. female  Chief Complaint  Patient presents with   Leg Pain   Vaginal Itching    Patient says she has recently noticed some vaginal itching on Wednesday. Patient says she recently changed soap.    Arm Pain   Hypertension   HYPERTENSION  Hypertension status: controlled  Satisfied with current treatment? yes Duration of hypertension: chronic BP monitoring frequency:  not checking BP medication side effects:  no Medication compliance: excellent compliance Previous BP meds:lisinopril-HCTZ Aspirin: no Recurrent headaches: no Visual changes: no Palpitations: no Dyspnea: no Chest pain: no Lower extremity edema: yes Dizzy/lightheaded: no  KNEE PAIN Duration: weeks Involved knee: bilateral Mechanism of injury: unknown Location:diffuse Onset: gradual Severity: mild  Quality:  aching and sore Frequency: intermittent Radiation: no Aggravating factors: bending and lifting  Alleviating factors: rest  Status: worse Treatments attempted: rest and APAP  Relief with NSAIDs?:  No NSAIDs Taken Weakness with weight bearing or walking: no Sensation of giving way: no Locking: no Popping: no Bruising: no Swelling: no Redness: no Paresthesias/decreased sensation: no Fevers: no  VAGINAL DISCHARGE Duration: few days Discharge description: mucous  Pruritus: yes Dysuria: yes Malodorous: no Urinary frequency: no Fevers: no Abdominal pain: yes  Sexual activity: Not concerned about STIs History of sexually transmitted diseases: no Recent antibiotic use: no Context: Changed soaps  Treatments attempted: none  Relevant past medical, surgical, family and social history reviewed and updated as  indicated. Interim medical history since our last visit reviewed. Allergies and medications reviewed and updated.  Review of Systems  Constitutional: Negative.   Respiratory: Negative.    Cardiovascular: Negative.   Gastrointestinal: Negative.   Musculoskeletal:  Positive for arthralgias and myalgias. Negative for back pain, gait problem, joint swelling, neck pain and neck stiffness.  Skin: Negative.   Neurological: Negative.   Psychiatric/Behavioral: Negative.      Per HPI unless specifically indicated above     Objective:    BP 138/83   Pulse 66   Temp 98.4 F (36.9 C) (Oral)   Ht 5\' 6"  (1.676 m)   Wt (!) 310 lb 9.6 oz (140.9 kg)   SpO2 98%   BMI 50.13 kg/m   Wt Readings from Last 3 Encounters:  05/12/23 (!) 310 lb 9.6 oz (140.9 kg)  11/17/22 (!) 307 lb (139.3 kg)  09/15/22 (!) 302 lb 0.5 oz (137 kg)    Physical Exam Vitals and nursing note reviewed.  Constitutional:      General: She is not in acute distress.    Appearance: Normal appearance. She is obese. She is not ill-appearing, toxic-appearing or diaphoretic.  HENT:     Head: Normocephalic and atraumatic.     Right Ear: External ear normal.     Left Ear: External ear normal.     Nose: Nose normal.     Mouth/Throat:     Mouth: Mucous membranes are moist.     Pharynx: Oropharynx is clear.  Eyes:     General: No scleral icterus.       Right eye: No discharge.  Left eye: No discharge.     Extraocular Movements: Extraocular movements intact.     Conjunctiva/sclera: Conjunctivae normal.     Pupils: Pupils are equal, round, and reactive to light.  Cardiovascular:     Rate and Rhythm: Normal rate and regular rhythm.     Pulses: Normal pulses.     Heart sounds: Normal heart sounds. No murmur heard.    No friction rub. No gallop.  Pulmonary:     Effort: Pulmonary effort is normal. No respiratory distress.     Breath sounds: Normal breath sounds. No stridor. No wheezing, rhonchi or rales.  Chest:      Chest wall: No tenderness.  Musculoskeletal:        General: Normal range of motion.     Cervical back: Normal range of motion and neck supple.  Skin:    General: Skin is warm and dry.     Capillary Refill: Capillary refill takes less than 2 seconds.     Coloration: Skin is not jaundiced or pale.     Findings: No bruising, erythema, lesion or rash.  Neurological:     General: No focal deficit present.     Mental Status: She is alert and oriented to person, place, and time. Mental status is at baseline.  Psychiatric:        Mood and Affect: Mood normal.        Behavior: Behavior normal.        Thought Content: Thought content normal.        Judgment: Judgment normal.     Results for orders placed or performed during the hospital encounter of 11/17/22  Resp panel by RT-PCR (RSV, Flu A&B, Covid) Anterior Nasal Swab   Specimen: Anterior Nasal Swab  Result Value Ref Range   SARS Coronavirus 2 by RT PCR NEGATIVE NEGATIVE   Influenza A by PCR NEGATIVE NEGATIVE   Influenza B by PCR NEGATIVE NEGATIVE   Resp Syncytial Virus by PCR NEGATIVE NEGATIVE      Assessment & Plan:   Problem List Items Addressed This Visit       Cardiovascular and Mediastinum   Essential hypertension - Primary    Under good control on current regimen. Continue current regimen. Continue to monitor. Call with any concerns. Refills given. Labs drawn today.        Relevant Medications   lisinopril-hydrochlorothiazide (ZESTORETIC) 20-25 MG tablet   Other Relevant Orders   CBC with Differential/Platelet   Comprehensive metabolic panel   TSH   Microalbumin, Urine Waived   Other Visit Diagnoses     Yeast infection       Will treat with diflucan. Call with any concerns.   Relevant Medications   fluconazole (DIFLUCAN) 150 MG tablet   Vaginal itching       + yeast- will treat.   Relevant Orders   Urinalysis, Routine w reflex microscopic   WET PREP FOR TRICH, YEAST, CLUE   Screening for cholesterol level        Labs drawn today. Await results.   Relevant Orders   Lipid Panel w/o Chol/HDL Ratio   Acute pain of both knees       Will start naproxen and stretches. Call with any concerns.   Left arm pain       Will start naproxen and stretches. Call with any concerns.        Follow up plan: Return As scheduled.

## 2023-05-12 NOTE — Assessment & Plan Note (Signed)
Under good control on current regimen. Continue current regimen. Continue to monitor. Call with any concerns. Refills given. Labs drawn today.   

## 2023-05-13 ENCOUNTER — Other Ambulatory Visit: Payer: Self-pay | Admitting: Family Medicine

## 2023-05-13 LAB — COMPREHENSIVE METABOLIC PANEL
ALT: 13 IU/L (ref 0–32)
AST: 13 IU/L (ref 0–40)
Albumin/Globulin Ratio: 1.1 — ABNORMAL LOW (ref 1.2–2.2)
Albumin: 3.9 g/dL (ref 3.9–4.9)
Alkaline Phosphatase: 91 IU/L (ref 44–121)
BUN/Creatinine Ratio: 14 (ref 9–23)
BUN: 11 mg/dL (ref 6–20)
Bilirubin Total: 0.7 mg/dL (ref 0.0–1.2)
CO2: 20 mmol/L (ref 20–29)
Calcium: 8.9 mg/dL (ref 8.7–10.2)
Chloride: 103 mmol/L (ref 96–106)
Creatinine, Ser: 0.76 mg/dL (ref 0.57–1.00)
Globulin, Total: 3.6 g/dL (ref 1.5–4.5)
Glucose: 79 mg/dL (ref 70–99)
Potassium: 4.2 mmol/L (ref 3.5–5.2)
Sodium: 137 mmol/L (ref 134–144)
Total Protein: 7.5 g/dL (ref 6.0–8.5)
eGFR: 105 mL/min/{1.73_m2} (ref >59)

## 2023-05-13 LAB — CBC WITH DIFFERENTIAL/PLATELET
Basophils Absolute: 0 10*3/uL (ref 0.0–0.2)
Basos: 1 %
EOS (ABSOLUTE): 0.1 10*3/uL (ref 0.0–0.4)
Eos: 3 %
Hematocrit: 34.5 % (ref 34.0–46.6)
Hemoglobin: 10.4 g/dL — ABNORMAL LOW (ref 11.1–15.9)
Immature Grans (Abs): 0 10*3/uL (ref 0.0–0.1)
Immature Granulocytes: 0 %
Lymphocytes Absolute: 1.9 10*3/uL (ref 0.7–3.1)
Lymphs: 36 %
MCH: 25.1 pg — ABNORMAL LOW (ref 26.6–33.0)
MCHC: 30.1 g/dL — ABNORMAL LOW (ref 31.5–35.7)
MCV: 83 fL (ref 79–97)
Monocytes Absolute: 0.5 10*3/uL (ref 0.1–0.9)
Monocytes: 9 %
Neutrophils Absolute: 2.7 10*3/uL (ref 1.4–7.0)
Neutrophils: 51 %
Platelets: 244 10*3/uL (ref 150–450)
RBC: 4.14 x10E6/uL (ref 3.77–5.28)
RDW: 14.5 % (ref 11.7–15.4)
WBC: 5.2 10*3/uL (ref 3.4–10.8)

## 2023-05-13 LAB — LIPID PANEL W/O CHOL/HDL RATIO
Cholesterol, Total: 130 mg/dL (ref 100–199)
HDL: 44 mg/dL (ref >39)
LDL Chol Calc (NIH): 68 mg/dL (ref 0–99)
Triglycerides: 98 mg/dL (ref 0–149)
VLDL Cholesterol Cal: 18 mg/dL (ref 5–40)

## 2023-05-13 LAB — TSH: TSH: 3.98 u[IU]/mL (ref 0.450–4.500)

## 2023-05-13 MED ORDER — IRON (FERROUS SULFATE) 325 (65 FE) MG PO TABS: 325.0000 mg | ORAL_TABLET | Freq: Every day | ORAL | 4 refills | Status: DC

## 2023-05-20 ENCOUNTER — Encounter: Payer: Self-pay | Admitting: Family Medicine

## 2023-05-20 ENCOUNTER — Ambulatory Visit (INDEPENDENT_AMBULATORY_CARE_PROVIDER_SITE_OTHER): Payer: 59 | Admitting: Family Medicine

## 2023-05-20 VITALS — BP 150/95 | HR 59 | Temp 98.6°F | Ht 67.0 in | Wt 304.4 lb

## 2023-05-20 DIAGNOSIS — B351 Tinea unguium: Secondary | ICD-10-CM | POA: Diagnosis not present

## 2023-05-20 DIAGNOSIS — Z0000 Encounter for general adult medical examination without abnormal findings: Secondary | ICD-10-CM

## 2023-05-20 NOTE — Progress Notes (Signed)
BP (!) 150/95 (BP Location: Left Arm, Cuff Size: Normal)   Pulse (!) 59   Temp 98.6 F (37 C) (Oral)   Ht 5\' 7"  (1.702 m)   Wt (!) 304 lb 6.4 oz (138.1 kg)   SpO2 99%   BMI 47.68 kg/m    Subjective:    Patient ID: Regina Chandler, female    DOB: 1991-04-23, 32 y.o.   MRN: [id number]  HPI: Regina Chandler is a 32 y.o. female presenting on 05/20/2023 for comprehensive medical examination. Current medical complaints include:none  Menopausal Symptoms: no  Depression Screen done today and results listed below:     05/20/2023   11:02 AM 05/12/2023   10:30 AM 09/18/2018    8:58 AM 09/04/2018   11:41 AM  Depression screen PHQ 2/9  Decreased Interest 0 0 0 1  Down, Depressed, Hopeless 0 1 0 1  PHQ - 2 Score 0 1 0 2  Altered sleeping 0 2 1 3   Tired, decreased energy 0 2 1 3   Change in appetite 0 0 0 1  Feeling bad or failure about yourself  0 1 0 0  Trouble concentrating 0 0 0 1  Moving slowly or fidgety/restless 0 0 0 0  Suicidal thoughts 0 0 0 0  PHQ-9 Score 0 6 2 10   Difficult doing work/chores Not difficult at all Not difficult at all       Past Medical History:  Past Medical History:  Diagnosis Date   Anemia    Anxiety    Bipolar 1 disorder (HCC)    Chronic hepatitis B affecting antepartum care of mother [facility]) 12/19/2017   Depression    GERD (gastroesophageal reflux disease)    NO MEDS   Headache    Hepatitis B affecting pregnancy 2007   No current treatment required, titers show up negative   Hypertension     Surgical History:  Past Surgical History:  Procedure Laterality Date   LAPAROSCOPIC TUBAL LIGATION N/A 07/30/2018   Procedure: LAPAROSCOPIC TUBAL LIGATION;  Surgeon: Vena Austria, MD;  Location: [facility] ORS;  Service: Gynecology;  Laterality: N/A;   NO PAST SURGERIES     NO PAST SURGERIES     TUBAL LIGATION  07/30/2018   [facility])    Medications:  Current Outpatient Medications on File Prior to Visit  Medication Sig   acetaminophen  (TYLENOL) 500 MG tablet Take 1 tablet (500 mg total) by mouth every 6 (six) hours as needed.   albuterol (VENTOLIN HFA) 108 (90 Base) MCG/ACT inhaler Inhale 2 puffs into the lungs every 6 (six) hours as needed for wheezing or shortness of breath.   aluminum chloride (DRYSOL) 20 % external solution Apply topically at bedtime.   fluticasone (FLONASE) 50 MCG/ACT nasal spray Place 2 sprays into both nostrils daily.   Iron, Ferrous Sulfate, 325 (65 Fe) MG TABS Take 325 mg by mouth daily.   lisinopril-hydrochlorothiazide (ZESTORETIC) 20-25 MG tablet Take 1 tablet by mouth daily.   naproxen (NAPROSYN) 500 MG tablet Take 1 tablet (500 mg total) by mouth 2 (two) times daily with a meal.   nystatin (MYCOSTATIN/NYSTOP) powder Apply 1 Application topically 3 (three) times daily.   No current facility-administered medications on file prior to visit.    Allergies:  Allergies  Allergen Reactions   Penicillins Swelling    Has patient had a PCN reaction causing immediate rash, facial/tongue/throat swelling, SOB or lightheadedness with hypotension: No Has patient had a PCN reaction causing severe rash involving mucus  membranes or skin necrosis: No Has patient had a PCN reaction that required hospitalization No Has patient had a PCN reaction occurring within the last 10 years: No If all of the above answers are "NO", then may proceed with Cephalosporin use.    Social History:  Social History   Socioeconomic History   Marital status: Single    Spouse name: Not on file   Number of children: Not on file   Years of education: Not on file   Highest education level: Not on file  Occupational History   Not on file  Tobacco Use   Smoking status: Never   Smokeless tobacco: Never  Vaping Use   Vaping Use: Never used  Substance and Sexual Activity   Alcohol use: Not Currently    Comment: RARE   Drug use: No   Sexual activity: Yes    Birth control/protection: Surgical    Comment: Tubal ligation  Other  Topics Concern   Not on file  Social History Narrative   Not on file   Social Determinants of Health   Financial Resource Strain: Not on file  Food Insecurity: Not on file  Transportation Needs: No Transportation Needs (11/20/2022)   PRAPARE - Transportation    Lack of Transportation (Medical): No    Lack of Transportation (Non-Medical): No  Physical Activity: Not on file  Stress: Not on file  Social Connections: Not on file  Intimate Partner Violence: Not on file   Social History   Tobacco Use  Smoking Status Never  Smokeless Tobacco Never   Social History   Substance and Sexual Activity  Alcohol Use Not Currently   Comment: RARE    Family History:  Family History  Problem Relation Age of Onset   Hypertension Sister    Hypertension Brother    Hypertension Maternal Grandmother    Diabetes Maternal Grandmother    Hypertension Maternal Grandfather    Hypertension Paternal Grandmother    Hypertension Paternal Grandfather    Cancer Neg Hx    Heart disease Neg Hx    Stroke Neg Hx     Past medical history, surgical history, medications, allergies, family history and social history reviewed with patient today and changes made to appropriate areas of the chart.   Review of Systems  Constitutional: Negative.   HENT: Negative.    Eyes: Negative.   Respiratory: Negative.    Cardiovascular:  Positive for leg swelling. Negative for chest pain, palpitations, orthopnea, claudication and PND.  Gastrointestinal: Negative.   Genitourinary: Negative.   Musculoskeletal: Negative.   Skin: Negative.   Neurological:  Positive for tingling. Negative for dizziness, tremors, sensory change, speech change, focal weakness, seizures, loss of consciousness, weakness and headaches.  Endo/Heme/Allergies:  Positive for environmental allergies. Negative for polydipsia. Does not bruise/bleed easily.  Psychiatric/Behavioral: Negative.     All other ROS negative except what is listed above  and in the HPI.      Objective:    BP (!) 150/95 (BP Location: Left Arm, Cuff Size: Normal)   Pulse (!) 59   Temp 98.6 F (37 C) (Oral)   Ht 5\' 7"  (1.702 m)   Wt (!) 304 lb 6.4 oz (138.1 kg)   SpO2 99%   BMI 47.68 kg/m   Wt Readings from Last 3 Encounters:  05/20/23 (!) 304 lb 6.4 oz (138.1 kg)  05/12/23 (!) 310 lb 9.6 oz (140.9 kg)  11/17/22 (!) 307 lb (139.3 kg)    Physical Exam Vitals and nursing note reviewed.  Constitutional:      General: She is not in acute distress.    Appearance: Normal appearance. She is obese. She is not ill-appearing, toxic-appearing or diaphoretic.  HENT:     Head: Normocephalic and atraumatic.     Right Ear: Tympanic membrane, ear canal and external ear normal. There is no impacted cerumen.     Left Ear: Tympanic membrane, ear canal and external ear normal. There is no impacted cerumen.     Nose: Nose normal. No congestion or rhinorrhea.     Mouth/Throat:     Mouth: Mucous membranes are moist.     Pharynx: Oropharynx is clear. No oropharyngeal exudate or posterior oropharyngeal erythema.  Eyes:     General: No scleral icterus.       Right eye: No discharge.        Left eye: No discharge.     Extraocular Movements: Extraocular movements intact.     Conjunctiva/sclera: Conjunctivae normal.     Pupils: Pupils are equal, round, and reactive to light.  Neck:     Vascular: No carotid bruit.  Cardiovascular:     Rate and Rhythm: Normal rate and regular rhythm.     Pulses: Normal pulses.     Heart sounds: No murmur heard.    No friction rub. No gallop.  Pulmonary:     Effort: Pulmonary effort is normal. No respiratory distress.     Breath sounds: Normal breath sounds. No stridor. No wheezing, rhonchi or rales.  Chest:     Chest wall: No tenderness.  Abdominal:     General: Abdomen is flat. Bowel sounds are normal. There is no distension.     Palpations: Abdomen is soft. There is no mass.     Tenderness: There is no abdominal tenderness.  There is no right CVA tenderness, left CVA tenderness, guarding or rebound.     Hernia: No hernia is present.  Genitourinary:    Comments: Breast and pelvic exams deferred with shared decision making Musculoskeletal:        General: No swelling, tenderness, deformity or signs of injury.     Cervical back: Normal range of motion and neck supple. No rigidity. No muscular tenderness.     Right lower leg: No edema.     Left lower leg: No edema.  Lymphadenopathy:     Cervical: No cervical adenopathy.  Skin:    General: Skin is warm and dry.     Capillary Refill: Capillary refill takes less than 2 seconds.     Coloration: Skin is not jaundiced or pale.     Findings: No bruising, erythema, lesion or rash.     Comments: Thickened discolored nail on great toes  Neurological:     General: No focal deficit present.     Mental Status: She is alert and oriented to person, place, and time. Mental status is at baseline.     Cranial Nerves: No cranial nerve deficit.     Sensory: No sensory deficit.     Motor: No weakness.     Coordination: Coordination normal.     Gait: Gait normal.     Deep Tendon Reflexes: Reflexes normal.  Psychiatric:        Mood and Affect: Mood normal.        Behavior: Behavior normal.        Thought Content: Thought content normal.        Judgment: Judgment normal.     Results for orders placed or performed in visit  on 05/12/23  WET PREP FOR TRICH, YEAST, CLUE   Specimen: Urine   Urine  Result Value Ref Range   Trichomonas Exam Negative Negative   Yeast Exam Positive (A) Negative   Clue Cell Exam Negative Negative  Microscopic Examination   Urine  Result Value Ref Range   WBC, UA 6-10 (A) 0 - 5 /hpf   RBC, Urine 0-2 0 - 2 /hpf   Epithelial Cells (non renal) 0-10 0 - 10 /hpf   Bacteria, UA Moderate (A) None seen/Few  Urinalysis, Routine w reflex microscopic  Result Value Ref Range   Specific Gravity, UA 1.020 1.005 - 1.030   pH, UA 6.5 5.0 - 7.5   Color, UA  Yellow Yellow   Appearance Ur Cloudy (A) Clear   Leukocytes,UA 1+ (A) Negative   Protein,UA Trace Negative/Trace   Glucose, UA Negative Negative   Ketones, UA Negative Negative   RBC, UA Negative Negative   Bilirubin, UA Negative Negative   Urobilinogen, Ur 1.0 0.2 - 1.0 mg/dL   Nitrite, UA Negative Negative   Microscopic Examination See below:   CBC with Differential/Platelet  Result Value Ref Range   WBC 5.2 3.4 - 10.8 x10E3/uL   RBC 4.14 3.77 - 5.28 x10E6/uL   Hemoglobin 10.4 (L) 11.1 - 15.9 g/dL   Hematocrit 16.1 09.6 - 46.6 %   MCV 83 79 - 97 fL   MCH 25.1 (L) 26.6 - 33.0 pg   MCHC 30.1 (L) 31.5 - 35.7 g/dL   RDW 04.5 40.9 - 81.1 %   Platelets 244 150 - 450 x10E3/uL   Neutrophils 51 Not Estab. %   Lymphs 36 Not Estab. %   Monocytes 9 Not Estab. %   Eos 3 Not Estab. %   Basos 1 Not Estab. %   Neutrophils Absolute 2.7 1.4 - 7.0 x10E3/uL   Lymphocytes Absolute 1.9 0.7 - 3.1 x10E3/uL   Monocytes Absolute 0.5 0.1 - 0.9 x10E3/uL   EOS (ABSOLUTE) 0.1 0.0 - 0.4 x10E3/uL   Basophils Absolute 0.0 0.0 - 0.2 x10E3/uL   Immature Granulocytes 0 Not Estab. %   Immature Grans (Abs) 0.0 0.0 - 0.1 x10E3/uL  Comprehensive metabolic panel  Result Value Ref Range   Glucose 79 70 - 99 mg/dL   BUN 11 6 - 20 mg/dL   Creatinine, Ser 9.14 0.57 - 1.00 mg/dL   eGFR 782 >95 AO/ZHY/8.65   BUN/Creatinine Ratio 14 9 - 23   Sodium 137 134 - 144 mmol/L   Potassium 4.2 3.5 - 5.2 mmol/L   Chloride 103 96 - 106 mmol/L   CO2 20 20 - 29 mmol/L   Calcium 8.9 8.7 - 10.2 mg/dL   Total Protein 7.5 6.0 - 8.5 g/dL   Albumin 3.9 3.9 - 4.9 g/dL   Globulin, Total 3.6 1.5 - 4.5 g/dL   Albumin/Globulin Ratio 1.1 (L) 1.2 - 2.2   Bilirubin Total 0.7 0.0 - 1.2 mg/dL   Alkaline Phosphatase 91 44 - 121 IU/L   AST 13 0 - 40 IU/L   ALT 13 0 - 32 IU/L  Lipid Panel w/o Chol/HDL Ratio  Result Value Ref Range   Cholesterol, Total 130 100 - 199 mg/dL   Triglycerides 98 0 - 149 mg/dL   HDL 44 >78 mg/dL   VLDL  Cholesterol Cal 18 5 - 40 mg/dL   LDL Chol Calc (NIH) 68 0 - 99 mg/dL  TSH  Result Value Ref Range   TSH 3.980 0.450 - 4.500 uIU/mL  Microalbumin, Urine Waived  Result Value Ref Range   Microalb, Ur Waived 80 (H) 0 - 19 mg/L   Creatinine, Urine Waived 100 10 - 300 mg/dL   Microalb/Creat Ratio 30-300 (H) <30 mg/g      Assessment & Plan:   Problem List Items Addressed This Visit   None Visit Diagnoses     Routine general medical examination at a health care facility    -  Primary   Vaccines up to date. Screening labs checked today. Pap up to date. Continue diet and exercise. Call with any concerns.   Onychomycosis       Referral to podiatry made today. Call with any concerns. Continue to monitor.   Relevant Orders   Ambulatory referral to Podiatry        Follow up plan: Return in about 6 months (around 11/19/2023).   LABORATORY TESTING:  - Pap smear: up to date  IMMUNIZATIONS:   - Tdap: Tetanus vaccination status reviewed: last tetanus booster within 10 years. - Influenza: Up to date - Pneumovax: Not applicable - Prevnar: Not applicable - COVID: Refused - HPV: Up to date  PATIENT COUNSELING:   Advised to take 1 mg of folate supplement per day if capable of pregnancy.   Sexuality: Discussed sexually transmitted diseases, partner selection, use of condoms, avoidance of unintended pregnancy  and contraceptive alternatives.   Advised to avoid cigarette smoking.  I discussed with the patient that most people either abstain from alcohol or drink within safe limits (<=14/week and <=4 drinks/occasion for males, <=7/weeks and <= 3 drinks/occasion for females) and that the risk for alcohol disorders and other health effects rises proportionally with the number of drinks per week and how often a drinker exceeds daily limits.  Discussed cessation/primary prevention of drug use and availability of treatment for abuse.   Diet: Encouraged to adjust caloric intake to maintain  or  achieve ideal body weight, to reduce intake of dietary saturated fat and total fat, to limit sodium intake by avoiding high sodium foods and not adding table salt, and to maintain adequate dietary potassium and calcium preferably from fresh fruits, vegetables, and low-fat dairy products.    stressed the importance of regular exercise  Injury prevention: Discussed safety belts, safety helmets, smoke detector, smoking near bedding or upholstery.   Dental health: Discussed importance of regular tooth brushing, flossing, and dental visits.    NEXT PREVENTATIVE PHYSICAL DUE IN 1 YEAR. Return in about 6 months (around 11/19/2023).

## 2023-05-21 ENCOUNTER — Ambulatory Visit: Payer: Medicaid Other | Admitting: Family Medicine

## 2023-06-10 ENCOUNTER — Telehealth (INDEPENDENT_AMBULATORY_CARE_PROVIDER_SITE_OTHER): Payer: 59 | Admitting: Family Medicine

## 2023-06-10 ENCOUNTER — Encounter: Payer: Self-pay | Admitting: Family Medicine

## 2023-06-10 DIAGNOSIS — L2082 Flexural eczema: Secondary | ICD-10-CM | POA: Diagnosis not present

## 2023-06-10 MED ORDER — TRIAMCINOLONE ACETONIDE 0.5 % EX OINT: 1.0000 | TOPICAL_OINTMENT | Freq: Two times a day (BID) | CUTANEOUS | 3 refills | Status: DC

## 2023-06-10 NOTE — Progress Notes (Signed)
LMP 05/13/2023 (Approximate)    Subjective:    Patient ID: Regina Chandler, female    DOB: July 20, 1991, 32 y.o.   MRN: [id number]  HPI: Regina Chandler is a 32 y.o. female  Chief Complaint  Patient presents with   Eczema    Pt states that her flare up and itching has got worse on arms, legs and face   RASH Duration:  in the summer so for the last month  Location: legs, arms and face  Itching: yes Burning: yes Redness: yes Oozing: no Scaling: no Blisters: no Painful: no Fevers: no Change in detergents/soaps/personal care products: no Recent illness: no Recent travel:no History of same: yes Context: worse Alleviating factors: nothing Treatments attempted:vasaline Shortness of breath: no  Throat/tongue swelling: no Myalgias/arthralgias: no  Relevant past medical, surgical, family and social history reviewed and updated as indicated. Interim medical history since our last visit reviewed. Allergies and medications reviewed and updated.  Review of Systems  Constitutional: Negative.   Respiratory: Negative.    Cardiovascular: Negative.   Gastrointestinal: Negative.   Musculoskeletal: Negative.   Skin:  Positive for rash. Negative for color change, pallor and wound.  Psychiatric/Behavioral: Negative.      Per HPI unless specifically indicated above     Objective:    LMP 05/13/2023 (Approximate)   Wt Readings from Last 3 Encounters:  05/20/23 (!) 304 lb 6.4 oz (138.1 kg)  05/12/23 (!) 310 lb 9.6 oz (140.9 kg)  11/17/22 (!) 307 lb (139.3 kg)    Physical Exam Vitals and nursing note reviewed.  Constitutional:      General: She is not in acute distress.    Appearance: Normal appearance. She is obese. She is not ill-appearing, toxic-appearing or diaphoretic.  HENT:     Head: Normocephalic and atraumatic.     Right Ear: External ear normal.     Left Ear: External ear normal.     Nose: Nose normal.     Mouth/Throat:     Mouth: Mucous membranes are moist.      Pharynx: Oropharynx is clear.  Eyes:     General: No scleral icterus.       Right eye: No discharge.        Left eye: No discharge.     Conjunctiva/sclera: Conjunctivae normal.     Pupils: Pupils are equal, round, and reactive to light.  Pulmonary:     Effort: Pulmonary effort is normal. No respiratory distress.     Comments: Speaking in full sentences Musculoskeletal:        General: Normal range of motion.     Cervical back: Normal range of motion.  Skin:    Coloration: Skin is not jaundiced or pale.     Findings: No bruising, erythema, lesion or rash.  Neurological:     Mental Status: She is alert and oriented to person, place, and time. Mental status is at baseline.  Psychiatric:        Mood and Affect: Mood normal.        Behavior: Behavior normal.        Thought Content: Thought content normal.        Judgment: Judgment normal.     Results for orders placed or performed in visit on 05/12/23  WET PREP FOR TRICH, YEAST, CLUE   Specimen: Urine   Urine  Result Value Ref Range   Trichomonas Exam Negative Negative   Yeast Exam Positive (A) Negative   Clue Cell Exam Negative Negative  Microscopic Examination   Urine  Result Value Ref Range   WBC, UA 6-10 (A) 0 - 5 /hpf   RBC, Urine 0-2 0 - 2 /hpf   Epithelial Cells (non renal) 0-10 0 - 10 /hpf   Bacteria, UA Moderate (A) None seen/Few  Urinalysis, Routine w reflex microscopic  Result Value Ref Range   Specific Gravity, UA 1.020 1.005 - 1.030   pH, UA 6.5 5.0 - 7.5   Color, UA Yellow Yellow   Appearance Ur Cloudy (A) Clear   Leukocytes,UA 1+ (A) Negative   Protein,UA Trace Negative/Trace   Glucose, UA Negative Negative   Ketones, UA Negative Negative   RBC, UA Negative Negative   Bilirubin, UA Negative Negative   Urobilinogen, Ur 1.0 0.2 - 1.0 mg/dL   Nitrite, UA Negative Negative   Microscopic Examination See below:   CBC with Differential/Platelet  Result Value Ref Range   WBC 5.2 3.4 - 10.8 x10E3/uL    RBC 4.14 3.77 - 5.28 x10E6/uL   Hemoglobin 10.4 (L) 11.1 - 15.9 g/dL   Hematocrit 16.1 09.6 - 46.6 %   MCV 83 79 - 97 fL   MCH 25.1 (L) 26.6 - 33.0 pg   MCHC 30.1 (L) 31.5 - 35.7 g/dL   RDW 04.5 40.9 - 81.1 %   Platelets 244 150 - 450 x10E3/uL   Neutrophils 51 Not Estab. %   Lymphs 36 Not Estab. %   Monocytes 9 Not Estab. %   Eos 3 Not Estab. %   Basos 1 Not Estab. %   Neutrophils Absolute 2.7 1.4 - 7.0 x10E3/uL   Lymphocytes Absolute 1.9 0.7 - 3.1 x10E3/uL   Monocytes Absolute 0.5 0.1 - 0.9 x10E3/uL   EOS (ABSOLUTE) 0.1 0.0 - 0.4 x10E3/uL   Basophils Absolute 0.0 0.0 - 0.2 x10E3/uL   Immature Granulocytes 0 Not Estab. %   Immature Grans (Abs) 0.0 0.0 - 0.1 x10E3/uL  Comprehensive metabolic panel  Result Value Ref Range   Glucose 79 70 - 99 mg/dL   BUN 11 6 - 20 mg/dL   Creatinine, Ser 9.14 0.57 - 1.00 mg/dL   eGFR 782 >95 AO/ZHY/8.65   BUN/Creatinine Ratio 14 9 - 23   Sodium 137 134 - 144 mmol/L   Potassium 4.2 3.5 - 5.2 mmol/L   Chloride 103 96 - 106 mmol/L   CO2 20 20 - 29 mmol/L   Calcium 8.9 8.7 - 10.2 mg/dL   Total Protein 7.5 6.0 - 8.5 g/dL   Albumin 3.9 3.9 - 4.9 g/dL   Globulin, Total 3.6 1.5 - 4.5 g/dL   Albumin/Globulin Ratio 1.1 (L) 1.2 - 2.2   Bilirubin Total 0.7 0.0 - 1.2 mg/dL   Alkaline Phosphatase 91 44 - 121 IU/L   AST 13 0 - 40 IU/L   ALT 13 0 - 32 IU/L  Lipid Panel w/o Chol/HDL Ratio  Result Value Ref Range   Cholesterol, Total 130 100 - 199 mg/dL   Triglycerides 98 0 - 149 mg/dL   HDL 44 >78 mg/dL   VLDL Cholesterol Cal 18 5 - 40 mg/dL   LDL Chol Calc (NIH) 68 0 - 99 mg/dL  TSH  Result Value Ref Range   TSH 3.980 0.450 - 4.500 uIU/mL  Microalbumin, Urine Waived  Result Value Ref Range   Microalb, Ur Waived 80 (H) 0 - 19 mg/L   Creatinine, Urine Waived 100 10 - 300 mg/dL   Microalb/Creat Ratio 30-300 (H) <30 mg/g      Assessment &  Plan:   Problem List Items Addressed This Visit   None Visit Diagnoses     Flexural eczema    -  Primary    Will treat with triamcinalone ointment. Call with any concerns. Continue to monitor.        Follow up plan: Return if symptoms worsen or fail to improve.   This visit was completed via video visit through MyChart due to the restrictions of the COVID-19 pandemic. All issues as above were discussed and addressed. Physical exam was done as above through visual confirmation on video through MyChart. If it was felt that the patient should be evaluated in the office, they were directed there. The patient verbally consented to this visit. Location of the patient: work Location of the provider: work Those involved with this call:  Provider: Olevia Perches, DO CMA: Maggie Font, CMA Front Desk/Registration:  Servando Snare   Time spent on call:  15 minutes with patient face to face via video conference. More than 50% of this time was spent in counseling and coordination of care. 23 minutes total spent in review of patient's record and preparation of their chart.

## 2023-06-20 ENCOUNTER — Emergency Department
Admission: EM | Admit: 2023-06-20 | Discharge: 2023-06-20 | Disposition: A | Discharge status: Home / Self Care | Payer: 59 | Attending: Emergency Medicine | Admitting: Emergency Medicine

## 2023-06-20 ENCOUNTER — Other Ambulatory Visit: Payer: Self-pay

## 2023-06-20 DIAGNOSIS — I10 Essential (primary) hypertension: Secondary | ICD-10-CM | POA: Insufficient documentation

## 2023-06-20 DIAGNOSIS — J029 Acute pharyngitis, unspecified: Secondary | ICD-10-CM

## 2023-06-20 DIAGNOSIS — Z20822 Contact with and (suspected) exposure to covid-19: Secondary | ICD-10-CM | POA: Insufficient documentation

## 2023-06-20 LAB — CBC WITH DIFFERENTIAL/PLATELET
Abs Immature Granulocytes: 0.01 10*3/uL (ref 0.00–0.07)
Basophils Absolute: 0 10*3/uL (ref 0.0–0.1)
Basophils Relative: 1 %
Eosinophils Absolute: 0.1 10*3/uL (ref 0.0–0.5)
Eosinophils Relative: 2 %
HCT: 34.8 % — ABNORMAL LOW (ref 36.0–46.0)
Hemoglobin: 11 g/dL — ABNORMAL LOW (ref 12.0–15.0)
Immature Granulocytes: 0 %
Lymphocytes Relative: 23 %
Lymphs Abs: 1.6 10*3/uL (ref 0.7–4.0)
MCH: 25.8 pg — ABNORMAL LOW (ref 26.0–34.0)
MCHC: 31.6 g/dL (ref 30.0–36.0)
MCV: 81.5 fL (ref 80.0–100.0)
Monocytes Absolute: 0.9 10*3/uL (ref 0.1–1.0)
Monocytes Relative: 14 %
Neutro Abs: 4.2 10*3/uL (ref 1.7–7.7)
Neutrophils Relative %: 60 %
Platelets: 200 10*3/uL (ref 150–400)
RBC: 4.27 MIL/uL (ref 3.87–5.11)
RDW: 14.5 % (ref 11.5–15.5)
WBC: 6.8 10*3/uL (ref 4.0–10.5)
nRBC: 0 % (ref 0.0–0.2)

## 2023-06-20 LAB — COMPREHENSIVE METABOLIC PANEL
ALT: 20 U/L (ref 0–44)
AST: 22 U/L (ref 15–41)
Albumin: 4 g/dL (ref 3.5–5.0)
Alkaline Phosphatase: 76 U/L (ref 38–126)
Anion gap: 9 (ref 5–15)
BUN: 10 mg/dL (ref 6–20)
CO2: 21 mmol/L — ABNORMAL LOW (ref 22–32)
Calcium: 8.7 mg/dL — ABNORMAL LOW (ref 8.9–10.3)
Chloride: 102 mmol/L (ref 98–111)
Creatinine, Ser: 0.92 mg/dL (ref 0.44–1.00)
GFR, Estimated: >60 mL/min (ref >60)
Glucose, Bld: 102 mg/dL — ABNORMAL HIGH (ref 70–99)
Potassium: 3.1 mmol/L — ABNORMAL LOW (ref 3.5–5.1)
Sodium: 132 mmol/L — ABNORMAL LOW (ref 135–145)
Total Bilirubin: 1.4 mg/dL — ABNORMAL HIGH (ref 0.3–1.2)
Total Protein: 8.4 g/dL — ABNORMAL HIGH (ref 6.5–8.1)

## 2023-06-20 LAB — SARS CORONAVIRUS 2 BY RT PCR: SARS Coronavirus 2 by RT PCR: NEGATIVE

## 2023-06-20 LAB — GROUP A STREP BY PCR: Group A Strep by PCR: NOT DETECTED

## 2023-06-20 LAB — LACTIC ACID, PLASMA: Lactic Acid, Venous: 0.9 mmol/L (ref 0.5–1.9)

## 2023-06-20 MED ORDER — CLINDAMYCIN HCL 300 MG PO CAPS: 300.0000 mg | ORAL_CAPSULE | Freq: Three times a day (TID) | ORAL | 0 refills | Status: AC

## 2023-06-20 MED ORDER — ACETAMINOPHEN 500 MG PO TABS
1000.0000 mg | ORAL_TABLET | Freq: Once | ORAL | Status: AC
  Administered 2023-06-20: 1000 mg via ORAL
  Filled 2023-06-20: qty 2

## 2023-06-20 MED ORDER — POTASSIUM CHLORIDE CRYS ER 20 MEQ PO TBCR
40.0000 meq | EXTENDED_RELEASE_TABLET | Freq: Once | ORAL | Status: AC
  Administered 2023-06-20: 40 meq via ORAL
  Filled 2023-06-20: qty 2

## 2023-06-20 MED ORDER — SODIUM CHLORIDE 0.9 % IV BOLUS
1000.0000 mL | Freq: Once | INTRAVENOUS | Status: AC
  Administered 2023-06-20: 1000 mL via INTRAVENOUS

## 2023-06-20 MED ORDER — CLINDAMYCIN HCL 150 MG PO CAPS
300.0000 mg | ORAL_CAPSULE | Freq: Once | ORAL | Status: AC
  Administered 2023-06-20: 300 mg via ORAL
  Filled 2023-06-20: qty 2

## 2023-06-20 NOTE — ED Notes (Signed)
Pt's son recently had strep; pt's speech clear; pt denies difficulty swallowing; pt diaphoretic; has received tylenol for her fever. Verbal hold on blood cultures per provider Joseph Art. Pt's resp reg/unlabored and pt is Comptroller on stretcher.

## 2023-06-20 NOTE — Discharge Instructions (Addendum)
Take Clindamycin three times daily for the next seven days.  

## 2023-06-20 NOTE — ED Triage Notes (Signed)
Pt here with a fever and sore throat x1 week. Pt states her temperature has been 103 all week. Pt states her throat is painful when swallowing. Pt also having chills, diaphoresis, and body aches.

## 2023-06-20 NOTE — ED Provider Notes (Signed)
[facility] Provider Note  Patient Contact: 8:24 PM (approximate)   History   Fever and Sore Throat   HPI  Regina Chandler is a 32 y.o. female with a history of headaches, anemia, anxiety and hypertension, presents to the emergency department with sore throat and fever for a week.  Patient reported that her son tested positive for group A strep and has been treated at home.  No associated rhinorrhea, nasal congestion or nonproductive cough.  No vomiting or diarrhea.      Physical Exam   Triage Vital Signs: ED Triage Vitals  Encounter Vitals Group     BP 06/20/23 1841 (!) 179/105     Systolic BP Percentile --      Diastolic BP Percentile --      Pulse Rate 06/20/23 1841 100     Resp 06/20/23 1841 (!) 21     Temp 06/20/23 1841 (!) 101.5 F (38.6 C)     Temp Source 06/20/23 1841 Oral     SpO2 06/20/23 1841 100 %     Weight 06/20/23 1843 (!) 304 lb 7.3 oz (138.1 kg)     Height 06/20/23 1843 5\' 7"  (1.702 m)     Head Circumference --      Peak Flow --      Pain Score 06/20/23 1843 6     Pain Loc --      Pain Education --      Exclude from Growth Chart --     Most recent vital signs: Vitals:   06/20/23 2103 06/20/23 2230  BP: (!) 145/89 (!) 146/82  Pulse: 87 73  Resp: 19 17  Temp: 98.7 F (37.1 C)   SpO2: 96% 97%     General: Alert and in no acute distress. Eyes:  PERRL. EOMI. Head: No acute traumatic findings ENT:      Nose: No congestion/rhinnorhea.      Mouth/Throat: Mucous membranes are moist.  Posterior pharynx appears erythematous.  Uvula is midline.  Tonsils appear very small on exam.  No neck swelling. Neck: No stridor. No cervical spine tenderness to palpation. Cardiovascular:  Good peripheral perfusion Respiratory: Normal respiratory effort without tachypnea or retractions. Lungs CTAB. Good air entry to the bases with no decreased or absent breath sounds. Gastrointestinal: Bowel sounds 4 quadrants. Soft and nontender to  palpation. No guarding or rigidity. No palpable masses. No distention. No CVA tenderness. Musculoskeletal: Full range of motion to all extremities.  Neurologic:  No gross focal neurologic deficits are appreciated.  Skin:   No rash noted   ED Results / Procedures / Treatments   Labs (all labs ordered are listed, but only abnormal results are displayed) Labs Reviewed  CBC WITH DIFFERENTIAL/PLATELET - Abnormal; Notable for the following components:      Result Value   Hemoglobin 11.0 (*)    HCT 34.8 (*)    MCH 25.8 (*)    All other components within normal limits  COMPREHENSIVE METABOLIC PANEL - Abnormal; Notable for the following components:   Sodium 132 (*)    Potassium 3.1 (*)    CO2 21 (*)    Glucose, Bld 102 (*)    Calcium 8.7 (*)    Total Protein 8.4 (*)    Total Bilirubin 1.4 (*)    All other components within normal limits  GROUP A STREP BY PCR  SARS CORONAVIRUS 2 BY RT PCR  LACTIC ACID, PLASMA  LACTIC ACID, PLASMA  PROCEDURES:  Critical Care performed: No  Procedures   MEDICATIONS ORDERED IN ED: Medications  acetaminophen (TYLENOL) tablet 1,000 mg (1,000 mg Oral Given 06/20/23 1846)  clindamycin (CLEOCIN) capsule 300 mg (300 mg Oral Given 06/20/23 2126)  sodium chloride 0.9 % bolus 1,000 mL (0 mLs Intravenous Stopped 06/20/23 2236)  potassium chloride SA (KLOR-CON M) CR tablet 40 mEq (40 mEq Oral Given 06/20/23 2126)     IMPRESSION / MDM / ASSESSMENT AND PLAN / ED COURSE  I reviewed the triage vital signs and the nursing notes.                              Assessment and plan: Pharyngitis:  32 year old female presents to the emergency department with pharyngitis that is occurred for 1 week as well as fevers.  Vital signs were largely reassuring at triage.  On exam, patient was alert and nontoxic-appearing.  Her uvula was midline and patient was able to maintain her own secretions.  She had very small tonsils on exam and there was no neck  swelling.  CMP notable for hyponatremia and hypokalemia but otherwise unremarkable.  CBC with normal white blood cell count.  Lactic within range.  COVID-19 and group A strep testing negative.  Given that patient's son tested positive for group A strep earlier this week, I will treat patient empirically for strep throat with clindamycin given anaphylaxis with penicillin.  Return precautions were given to return with new or worsening symptoms.  All patient questions were answered.      FINAL CLINICAL IMPRESSION(S) / ED DIAGNOSES   Final diagnoses:  Pharyngitis, unspecified etiology     Rx / DC Orders   ED Discharge Orders          Ordered    clindamycin (CLEOCIN) 300 MG capsule  3 times daily        06/20/23 2233             Note:  This document was prepared using Dragon voice recognition software and may include unintentional dictation errors.   Pia Mau [place], PA-C 06/20/23 2326    Dionne Bucy, MD 06/21/23 [phone]

## 2023-06-20 NOTE — ED Notes (Addendum)
Pt reports her worst symptoms are body aches, headache and night sweats. Visitor at bedside.

## 2023-06-24 ENCOUNTER — Encounter: Payer: Self-pay | Admitting: Podiatry

## 2023-06-24 ENCOUNTER — Ambulatory Visit (INDEPENDENT_AMBULATORY_CARE_PROVIDER_SITE_OTHER): Payer: 59 | Admitting: Podiatry

## 2023-06-24 VITALS — BP 187/116 | HR 71

## 2023-06-24 DIAGNOSIS — B351 Tinea unguium: Secondary | ICD-10-CM | POA: Diagnosis not present

## 2023-06-24 MED ORDER — TERBINAFINE HCL 250 MG PO TABS: 250.0000 mg | ORAL_TABLET | Freq: Every day | ORAL | 0 refills | Status: AC

## 2023-06-24 NOTE — Progress Notes (Addendum)
   Subjective: 32 y.o. female presenting today as a new patient for evaluation of discoloration with thickening to the bilateral toenails over the past year.  She has tried different topical antifungal medications with no improvement.  Presenting for further treatment and evaluation  Past Medical History:  Diagnosis Date   Anemia    Anxiety    Bipolar 1 disorder (HCC)    Chronic hepatitis B affecting antepartum care of mother [facility]) 12/19/2017   Depression    GERD (gastroesophageal reflux disease)    NO MEDS   Headache    Hepatitis B affecting pregnancy 2007   No current treatment required, titers show up negative   Hypertension     Past Surgical History:  Procedure Laterality Date   LAPAROSCOPIC TUBAL LIGATION N/A 07/30/2018   Procedure: LAPAROSCOPIC TUBAL LIGATION;  Surgeon: Vena Austria, MD;  Location: [facility] ORS;  Service: Gynecology;  Laterality: N/A;   NO PAST SURGERIES     NO PAST SURGERIES     TUBAL LIGATION  07/30/2018   [facility] ([place])    Allergies  Allergen Reactions   Penicillins Swelling    Has patient had a PCN reaction causing immediate rash, facial/tongue/throat swelling, SOB or lightheadedness with hypotension: No Has patient had a PCN reaction causing severe rash involving mucus membranes or skin necrosis: No Has patient had a PCN reaction that required hospitalization No Has patient had a PCN reaction occurring within the last 10 years: No If all of the above answers are "NO", then may proceed with Cephalosporin use.    06/24/2023  Objective: Physical Exam General: The patient is alert and oriented x3 in no acute distress.  Dermatology: Hyperkeratotic, discolored, thickened, onychodystrophy noted bilateral toenails. Skin is warm, dry and supple bilateral lower extremities. Negative for open lesions or macerations.  Vascular: Palpable pedal pulses bilaterally. No edema or erythema noted. Capillary refill within normal limits.  Neurological:  Epicritic and protective threshold grossly intact bilaterally.   Musculoskeletal Exam: No pedal deformity noted  Assessment: #1 Onychomycosis of toenails bilateral  Plan of Care:  #1 Patient was evaluated. #2  Today we discussed different treatment options including oral, topical, and laser antifungal treatment modalities.  We discussed their efficacies and side effects.  Patient opts for oral antifungal treatment modality #3 prescription for Lamisil 250 mg #90 daily.  CMP on 06/20/2023 hepatic function WNL with exception of total bilirubin slightly elevated but WNL on prior liver function panels #4 return to clinic 6 months   Felecia Shelling, DPM [facility]  Dr. Felecia Shelling, DPM    [address] [place], [place] [address]                Office [phone]  Fax [phone]

## 2023-09-16 ENCOUNTER — Encounter: Payer: Self-pay | Admitting: Family Medicine

## 2023-09-16 ENCOUNTER — Telehealth (INDEPENDENT_AMBULATORY_CARE_PROVIDER_SITE_OTHER): Payer: 59 | Admitting: Family Medicine

## 2023-09-16 DIAGNOSIS — J069 Acute upper respiratory infection, unspecified: Secondary | ICD-10-CM

## 2023-09-16 MED ORDER — PREDNISONE 50 MG PO TABS: 50.0000 mg | ORAL_TABLET | Freq: Every day | ORAL | 0 refills | Status: DC

## 2023-09-16 MED ORDER — BENZONATATE 200 MG PO CAPS: 200.0000 mg | ORAL_CAPSULE | Freq: Two times a day (BID) | ORAL | 1 refills | Status: DC | PRN

## 2023-09-16 MED ORDER — TRIAMCINOLONE ACETONIDE 0.5 % EX OINT: 1.0000 | TOPICAL_OINTMENT | Freq: Two times a day (BID) | CUTANEOUS | 3 refills | Status: DC

## 2023-09-16 NOTE — Progress Notes (Signed)
Appointment has been made

## 2023-09-16 NOTE — Progress Notes (Signed)
There were no vitals taken for this visit.   Subjective:    Patient ID: Regina Chandler, female    DOB: 1991/11/18, 32 y.o.   MRN: [id number]  HPI: Regina Chandler is a 32 y.o. female  Chief Complaint  Patient presents with   Sinus Problem    Patient says she has a lot of pressure in sinus area and says when she sniffle it is a lot of pressure in her head. Patient says she first started having symptoms yesterday. Patient says she is trying to cough up the phlegm in her chest. Patient says she took an at-home COVID test and it was negative. Patient says that she has only tried Nyquil and it didn't help.    UPPER RESPIRATORY TRACT INFECTION Duration: about a day Worst symptom:nasal congestion Fever: no Cough: yes Shortness of breath: no Wheezing: no Chest pain: no Chest tightness: no Chest congestion: no Nasal congestion: yes Runny nose: no Post nasal drip: no Sneezing: no Sore throat: yes Swollen glands: no Sinus pressure: no Headache: no Face pain: no Toothache: no Ear pain: no  Ear pressure: no  Eyes red/itching:no Eye drainage/crusting: no  Vomiting: no Rash: no Fatigue: yes Sick contacts: yes Strep contacts: no  Context: stable Recurrent sinusitis: no Relief with OTC cold/cough medications: no  Treatments attempted: nyquil   Relevant past medical, surgical, family and social history reviewed and updated as indicated. Interim medical history since our last visit reviewed. Allergies and medications reviewed and updated.  Review of Systems  Constitutional:  Positive for fatigue. Negative for activity change, appetite change, chills, diaphoresis, fever and unexpected weight change.  HENT:  Positive for congestion, rhinorrhea, sinus pressure and sore throat. Negative for dental problem, drooling, ear discharge, ear pain, facial swelling, hearing loss, mouth sores, nosebleeds, postnasal drip, sinus pain, sneezing, tinnitus, trouble swallowing and voice change.    Respiratory:  Positive for cough. Negative for apnea, choking, chest tightness, shortness of breath, wheezing and stridor.   Cardiovascular: Negative.     Per HPI unless specifically indicated above     Objective:    There were no vitals taken for this visit.  Wt Readings from Last 3 Encounters:  06/20/23 (!) 304 lb 7.3 oz (138.1 kg)  05/20/23 (!) 304 lb 6.4 oz (138.1 kg)  05/12/23 (!) 310 lb 9.6 oz (140.9 kg)    Physical Exam Vitals and nursing note reviewed.  Constitutional:      General: She is not in acute distress.    Appearance: Normal appearance. She is obese. She is not ill-appearing, toxic-appearing or diaphoretic.  HENT:     Head: Normocephalic and atraumatic.     Right Ear: External ear normal.     Left Ear: External ear normal.     Nose: Nose normal.     Mouth/Throat:     Mouth: Mucous membranes are moist.     Pharynx: Oropharynx is clear.  Eyes:     General: No scleral icterus.       Right eye: No discharge.        Left eye: No discharge.     Conjunctiva/sclera: Conjunctivae normal.     Pupils: Pupils are equal, round, and reactive to light.  Pulmonary:     Effort: Pulmonary effort is normal. No respiratory distress.     Comments: Speaking in full sentences Musculoskeletal:        General: Normal range of motion.     Cervical back: Normal range of motion.  Skin:  Coloration: Skin is not jaundiced or pale.     Findings: No bruising, erythema, lesion or rash.  Neurological:     Mental Status: She is alert and oriented to person, place, and time. Mental status is at baseline.  Psychiatric:        Mood and Affect: Mood normal.        Behavior: Behavior normal.        Thought Content: Thought content normal.        Judgment: Judgment normal.     Results for orders placed or performed during the hospital encounter of 06/20/23  Group A Strep by PCR ([facility] Only)   Specimen: Anterior Nasal Swab; Sterile Swab  Result Value Ref Range   Group A Strep by  PCR NOT DETECTED NOT DETECTED  SARS Coronavirus 2 by RT PCR (hospital order, performed in [facility] hospital lab) *cepheid single result test* Anterior Nasal Swab   Specimen: Anterior Nasal Swab  Result Value Ref Range   SARS Coronavirus 2 by RT PCR NEGATIVE NEGATIVE  CBC with Differential  Result Value Ref Range   WBC 6.8 4.0 - 10.5 K/uL   RBC 4.27 3.87 - 5.11 MIL/uL   Hemoglobin 11.0 (L) 12.0 - 15.0 g/dL   HCT 29.5 (L) 28.4 - 13.2 %   MCV 81.5 80.0 - 100.0 fL   MCH 25.8 (L) 26.0 - 34.0 pg   MCHC 31.6 30.0 - 36.0 g/dL   RDW 44.0 10.2 - 72.5 %   Platelets 200 150 - 400 K/uL   nRBC 0.0 0.0 - 0.2 %   Neutrophils Relative % 60 %   Neutro Abs 4.2 1.7 - 7.7 K/uL   Lymphocytes Relative 23 %   Lymphs Abs 1.6 0.7 - 4.0 K/uL   Monocytes Relative 14 %   Monocytes Absolute 0.9 0.1 - 1.0 K/uL   Eosinophils Relative 2 %   Eosinophils Absolute 0.1 0.0 - 0.5 K/uL   Basophils Relative 1 %   Basophils Absolute 0.0 0.0 - 0.1 K/uL   Immature Granulocytes 0 %   Abs Immature Granulocytes 0.01 0.00 - 0.07 K/uL  Comprehensive metabolic panel  Result Value Ref Range   Sodium 132 (L) 135 - 145 mmol/L   Potassium 3.1 (L) 3.5 - 5.1 mmol/L   Chloride 102 98 - 111 mmol/L   CO2 21 (L) 22 - 32 mmol/L   Glucose, Bld 102 (H) 70 - 99 mg/dL   BUN 10 6 - 20 mg/dL   Creatinine, Ser 3.66 0.44 - 1.00 mg/dL   Calcium 8.7 (L) 8.9 - 10.3 mg/dL   Total Protein 8.4 (H) 6.5 - 8.1 g/dL   Albumin 4.0 3.5 - 5.0 g/dL   AST 22 15 - 41 U/L   ALT 20 0 - 44 U/L   Alkaline Phosphatase 76 38 - 126 U/L   Total Bilirubin 1.4 (H) 0.3 - 1.2 mg/dL   GFR, Estimated >44 >03 mL/min   Anion gap 9 5 - 15  Lactic acid, plasma  Result Value Ref Range   Lactic Acid, Venous 0.9 0.5 - 1.9 mmol/L      Assessment & Plan:   Problem List Items Addressed This Visit   None Visit Diagnoses     Upper respiratory tract infection, unspecified type    -  Primary   Will treat with prednisone and tessalon perles. Call with any concerns.  Continue to monitor.        Follow up plan: Return December 6 month follow up.  This visit was completed via video visit through MyChart due to the restrictions of the COVID-19 pandemic. All issues as above were discussed and addressed. Physical exam was done as above through visual confirmation on video through MyChart. If it was felt that the patient should be evaluated in the office, they were directed there. The patient verbally consented to this visit. Location of the patient: work Location of the provider: home Those involved with this call:  Provider: Olevia Perches, DO CMA: Malen Gauze, CMA Front Desk/Registration:  Servando Snare   Time spent on call:  15 minutes with patient face to face via video conference. More than 50% of this time was spent in counseling and coordination of care. 23 minutes total spent in review of patient's record and preparation of their chart.

## 2023-10-23 ENCOUNTER — Other Ambulatory Visit: Payer: Self-pay | Admitting: Family Medicine

## 2023-10-23 ENCOUNTER — Encounter: Payer: Self-pay | Admitting: Family Medicine

## 2023-10-23 ENCOUNTER — Telehealth: Payer: 59 | Admitting: Family Medicine

## 2023-10-23 DIAGNOSIS — E669 Obesity, unspecified: Secondary | ICD-10-CM

## 2023-10-23 DIAGNOSIS — J069 Acute upper respiratory infection, unspecified: Secondary | ICD-10-CM | POA: Diagnosis not present

## 2023-10-23 DIAGNOSIS — Z6841 Body Mass Index (BMI) 40.0 and over, adult: Secondary | ICD-10-CM

## 2023-10-23 MED ORDER — SEMAGLUTIDE-WEIGHT MANAGEMENT 1 MG/0.5ML ~~LOC~~ SOAJ: 1.0000 mg | SUBCUTANEOUS | 0 refills | Status: AC

## 2023-10-23 MED ORDER — BENZONATATE 200 MG PO CAPS: 200.0000 mg | ORAL_CAPSULE | Freq: Two times a day (BID) | ORAL | 1 refills | Status: DC | PRN

## 2023-10-23 MED ORDER — SEMAGLUTIDE-WEIGHT MANAGEMENT 0.25 MG/0.5ML ~~LOC~~ SOAJ: 0.2500 mg | SUBCUTANEOUS | 0 refills | Status: AC

## 2023-10-23 MED ORDER — SEMAGLUTIDE-WEIGHT MANAGEMENT 0.5 MG/0.5ML ~~LOC~~ SOAJ: 0.5000 mg | SUBCUTANEOUS | 0 refills | Status: AC

## 2023-10-23 NOTE — Progress Notes (Addendum)
There were no vitals taken for this visit.   Subjective:    Patient ID: Regina Chandler, female    DOB: August 04, 1991, 32 y.o.   MRN: [id number]  HPI: Regina Chandler is a 32 y.o. female  Chief Complaint  Patient presents with   Nasal Congestion    Patient says her nose has been running really really bad, and says she does not have COVID. Patient says she first noticed it yesterday and says it has been really cold inside of her workplace.    Cough   Weight Loss   UPPER RESPIRATORY TRACT INFECTION Duration: 1 day Worst symptom: drainage, congestion Fever: no Cough: yes Shortness of breath: no Wheezing: no Chest pain: no Chest tightness: no Chest congestion: no Nasal congestion: yes Runny nose: yes Post nasal drip: no Sneezing: no Sore throat: no Swollen glands: no Sinus pressure: no Headache: yes Face pain: no Toothache: no Ear pain: no  Ear pressure: no  Eyes red/itching:no Eye drainage/crusting: no  Vomiting: no Rash: no Fatigue: yes Sick contacts: yes Strep contacts: no  Context: stable Recurrent sinusitis: no Relief with OTC cold/cough medications: no  Treatments attempted: cough syrup, cough drops   OBESITY Duration: chronic Previous attempts at weight loss: yes Complications of obesity: HTN, leg pain Peak weight: 310lbs Weight loss goal: to be healthy Weight loss to date: 6lbs Requesting obesity pharmacotherapy: yes Current weight loss supplements/medications: no Previous weight loss supplements/meds: no  Relevant past medical, surgical, family and social history reviewed and updated as indicated. Interim medical history since our last visit reviewed. Allergies and medications reviewed and updated.  Review of Systems  Constitutional: Negative.   HENT:  Positive for congestion and postnasal drip. Negative for dental problem, drooling, ear discharge, ear pain, facial swelling, hearing loss, mouth sores, nosebleeds, rhinorrhea, sinus pressure,  sinus pain, sneezing, sore throat, tinnitus, trouble swallowing and voice change.   Eyes: Negative.   Respiratory: Negative.    Cardiovascular: Negative.   Gastrointestinal: Negative.   Musculoskeletal: Negative.   Neurological: Negative.   Psychiatric/Behavioral: Negative.      Per HPI unless specifically indicated above     Objective:    There were no vitals taken for this visit.  Wt Readings from Last 3 Encounters:  06/20/23 (!) 304 lb 7.3 oz (138.1 kg)  05/20/23 (!) 304 lb 6.4 oz (138.1 kg)  05/12/23 (!) 310 lb 9.6 oz (140.9 kg)    Physical Exam Vitals and nursing note reviewed.  Constitutional:      General: She is not in acute distress.    Appearance: Normal appearance. She is not ill-appearing, toxic-appearing or diaphoretic.  HENT:     Head: Normocephalic and atraumatic.     Right Ear: External ear normal.     Left Ear: External ear normal.     Nose: Nose normal.     Mouth/Throat:     Mouth: Mucous membranes are moist.     Pharynx: Oropharynx is clear.  Eyes:     General: No scleral icterus.       Right eye: No discharge.        Left eye: No discharge.     Conjunctiva/sclera: Conjunctivae normal.     Pupils: Pupils are equal, round, and reactive to light.  Pulmonary:     Effort: Pulmonary effort is normal. No respiratory distress.     Comments: Speaking in full sentences Musculoskeletal:        General: Normal range of motion.  Cervical back: Normal range of motion.  Skin:    Coloration: Skin is not jaundiced or pale.     Findings: No bruising, erythema, lesion or rash.  Neurological:     Mental Status: She is alert and oriented to person, place, and time. Mental status is at baseline.  Psychiatric:        Mood and Affect: Mood normal.        Behavior: Behavior normal.        Thought Content: Thought content normal.        Judgment: Judgment normal.     Results for orders placed or performed during the hospital encounter of 06/20/23  Group A  Strep by PCR ([facility] Only)   Specimen: Anterior Nasal Swab; Sterile Swab  Result Value Ref Range   Group A Strep by PCR NOT DETECTED NOT DETECTED  SARS Coronavirus 2 by RT PCR (hospital order, performed in [facility] hospital lab) *cepheid single result test* Anterior Nasal Swab   Specimen: Anterior Nasal Swab  Result Value Ref Range   SARS Coronavirus 2 by RT PCR NEGATIVE NEGATIVE  CBC with Differential  Result Value Ref Range   WBC 6.8 4.0 - 10.5 K/uL   RBC 4.27 3.87 - 5.11 MIL/uL   Hemoglobin 11.0 (L) 12.0 - 15.0 g/dL   HCT 82.9 (L) 56.2 - 13.0 %   MCV 81.5 80.0 - 100.0 fL   MCH 25.8 (L) 26.0 - 34.0 pg   MCHC 31.6 30.0 - 36.0 g/dL   RDW 86.5 78.4 - 69.6 %   Platelets 200 150 - 400 K/uL   nRBC 0.0 0.0 - 0.2 %   Neutrophils Relative % 60 %   Neutro Abs 4.2 1.7 - 7.7 K/uL   Lymphocytes Relative 23 %   Lymphs Abs 1.6 0.7 - 4.0 K/uL   Monocytes Relative 14 %   Monocytes Absolute 0.9 0.1 - 1.0 K/uL   Eosinophils Relative 2 %   Eosinophils Absolute 0.1 0.0 - 0.5 K/uL   Basophils Relative 1 %   Basophils Absolute 0.0 0.0 - 0.1 K/uL   Immature Granulocytes 0 %   Abs Immature Granulocytes 0.01 0.00 - 0.07 K/uL  Comprehensive metabolic panel  Result Value Ref Range   Sodium 132 (L) 135 - 145 mmol/L   Potassium 3.1 (L) 3.5 - 5.1 mmol/L   Chloride 102 98 - 111 mmol/L   CO2 21 (L) 22 - 32 mmol/L   Glucose, Bld 102 (H) 70 - 99 mg/dL   BUN 10 6 - 20 mg/dL   Creatinine, Ser 2.95 0.44 - 1.00 mg/dL   Calcium 8.7 (L) 8.9 - 10.3 mg/dL   Total Protein 8.4 (H) 6.5 - 8.1 g/dL   Albumin 4.0 3.5 - 5.0 g/dL   AST 22 15 - 41 U/L   ALT 20 0 - 44 U/L   Alkaline Phosphatase 76 38 - 126 U/L   Total Bilirubin 1.4 (H) 0.3 - 1.2 mg/dL   GFR, Estimated >28 >41 mL/min   Anion gap 9 5 - 15  Lactic acid, plasma  Result Value Ref Range   Lactic Acid, Venous 0.9 0.5 - 1.9 mmol/L      Assessment & Plan:   Problem List Items Addressed This Visit       Other   BMI 50.0-59.9, adult (HCC)    Will  start her on wegovy. Recheck 3 months. Call with any concerns.       Relevant Medications   Semaglutide-Weight Management 0.25 MG/0.5ML  SOAJ   Semaglutide-Weight Management 0.5 MG/0.5ML SOAJ (Start on 11/21/2023)   Semaglutide-Weight Management 1 MG/0.5ML SOAJ (Start on 12/20/2023)   Other Visit Diagnoses     Upper respiratory tract infection, unspecified type    -  Primary   Rest and symptomatic care. Call if not getting better or getting worse. Continue to monitor.        Follow up plan: Return in about 3 months (around 01/23/2024).    This visit was completed via video visit through MyChart due to the restrictions of the COVID-19 pandemic. All issues as above were discussed and addressed. Physical exam was done as above through visual confirmation on video through MyChart. If it was felt that the patient should be evaluated in the office, they were directed there. The patient verbally consented to this visit. Location of the patient: work Location of the provider: work Those involved with this call:  Provider: Olevia Perches, DO CMA: Malen Gauze, CMA Front Desk/Registration:  Servando Snare   Time spent on call:  25 minutes with patient face to face via video conference. More than 50% of this time was spent in counseling and coordination of care. 40 minutes total spent in review of patient's record and preparation of their chart.

## 2023-10-23 NOTE — Assessment & Plan Note (Signed)
Will start her on wegovy. Recheck 3 months. Call with any concerns.

## 2023-10-24 NOTE — Telephone Encounter (Signed)
Requested medication (s) are due for refill today -no  Requested medication (s) are on the active medication list -yes  Future visit scheduled -yes  Last refill: 10/23/23  Notes to clinic:   Pharmacy comment: Alternative Requested:PRODUCT NOT COVERED. PA NEEDED. PLEASE ADVISE.    Requested Prescriptions  Pending Prescriptions Disp Refills   WEGOVY 1 MG/0.5ML SOAJ [Pharmacy Med Name: WEGOVY 1 MG/0.5 ML PEN]  0    Sig: INJECT 1 MG INTO THE SKIN ONCE A WEEK FOR 28 DAYS.     Endocrinology:  Diabetes - GLP-1 Receptor Agonists - semaglutide Failed - 10/23/2023  2:26 PM      Failed - HBA1C in normal range and within 180 days    Hgb A1c MFr Bld  Date Value Ref Range Status  02/16/2018 5.0 4.8 - 5.6 % Final    Comment:             Prediabetes: 5.7 - 6.4          Diabetes: >6.4          Glycemic control for adults with diabetes: <7.0          Passed - Cr in normal range and within 360 days    Creatinine  Date Value Ref Range Status  06/27/2012 0.72 0.60 - 1.30 mg/dL Final   Creatinine, Ser  Date Value Ref Range Status  06/20/2023 0.92 0.44 - 1.00 mg/dL Final   Creatinine, Urine  Date Value Ref Range Status  04/03/2018 112 mg/dL Final         Passed - Valid encounter within last 6 months    Recent Outpatient Visits           Yesterday Upper respiratory tract infection, unspecified type   [place] [facility] [place], Megan P, DO   1 month ago Upper respiratory tract infection, unspecified type   [place] [facility] [place], Megan P, DO   4 months ago Flexural eczema   [place] [facility] [place], Megan P, DO   5 months ago Routine general medical examination at a health care facility   [facility] [place], Megan P, DO   5 months ago Essential hypertension   [place], , DO       Future Appointments             In 4 months Laural Benes, Oralia Rud, DO [facility]               Requested Prescriptions  Pending Prescriptions Disp Refills   WEGOVY 1 MG/0.5ML SOAJ [Pharmacy Med Name: WEGOVY 1 MG/0.5 ML PEN]  0    Sig: INJECT 1 MG INTO THE SKIN ONCE A WEEK FOR 28 DAYS.     Endocrinology:  Diabetes - GLP-1 Receptor Agonists - semaglutide Failed - 10/23/2023  2:26 PM      Failed - HBA1C in normal range and within 180 days    Hgb A1c MFr Bld  Date Value Ref Range Status  02/16/2018 5.0 4.8 - 5.6 % Final    Comment:             Prediabetes: 5.7 - 6.4          Diabetes: >6.4          Glycemic control for adults with diabetes: <7.0          Passed - Cr in normal range and within 360 days  Creatinine  Date Value Ref Range Status  06/27/2012 0.72 0.60 - 1.30 mg/dL Final   Creatinine, Ser  Date Value Ref Range Status  06/20/2023 0.92 0.44 - 1.00 mg/dL Final   Creatinine, Urine  Date Value Ref Range Status  04/03/2018 112 mg/dL Final         Passed - Valid encounter within last 6 months    Recent Outpatient Visits           Yesterday Upper respiratory tract infection, unspecified type   [place] [facility] [place], Megan P, DO   1 month ago Upper respiratory tract infection, unspecified type   [place] [facility] [place], Megan P, DO   4 months ago Flexural eczema   [place] [facility] [place], Megan P, DO   5 months ago Routine general medical examination at a health care facility   [facility], Megan P, DO   5 months ago Essential hypertension   [place] [facility] [place], Oralia Rud, DO       Future Appointments             In 4 months Laural Benes, Oralia Rud, DO [facility]

## 2023-10-28 NOTE — Telephone Encounter (Signed)
Does not appear to be covered- but needs PA. Please do PA

## 2023-10-28 NOTE — Telephone Encounter (Signed)
PA initiated and submitted via Cover My Meds. Key: [id number]

## 2023-11-17 NOTE — Telephone Encounter (Signed)
Patient called stated she was denied for the [facility] and she wants to appeal. Insurance will need more info as to why the medication was prescribed. Please f/u with patient

## 2023-12-26 ENCOUNTER — Encounter: Payer: Self-pay | Admitting: Family Medicine

## 2023-12-26 ENCOUNTER — Telehealth (INDEPENDENT_AMBULATORY_CARE_PROVIDER_SITE_OTHER): Payer: 59 | Admitting: Family Medicine

## 2023-12-26 DIAGNOSIS — N898 Other specified noninflammatory disorders of vagina: Secondary | ICD-10-CM

## 2023-12-26 DIAGNOSIS — L2082 Flexural eczema: Secondary | ICD-10-CM | POA: Insufficient documentation

## 2023-12-26 MED ORDER — NYSTATIN 100000 UNIT/GM EX POWD: 1.0000 | Freq: Three times a day (TID) | CUTANEOUS | 0 refills | Status: DC

## 2023-12-26 MED ORDER — METRONIDAZOLE 500 MG PO TABS: 500.0000 mg | ORAL_TABLET | Freq: Two times a day (BID) | ORAL | 0 refills | Status: DC

## 2023-12-26 MED ORDER — CLOBETASOL PROPIONATE 0.05 % EX OINT: 1.0000 | TOPICAL_OINTMENT | Freq: Two times a day (BID) | CUTANEOUS | 0 refills | Status: AC

## 2023-12-26 NOTE — Progress Notes (Signed)
There were no vitals taken for this visit.   Subjective:    Patient ID: Regina Chandler, female    DOB: 13-Aug-1991, 33 y.o.   MRN: [id number]  HPI: Regina Chandler is a 33 y.o. female  Chief Complaint  Patient presents with   Eczema    Patient request a different treatment option for her diagnosis, as she says the medication works a little, but it does not dry it up like it supposed to.    Vaginal Itching   Vaginal Discharge    Patient says she first noticed symptoms of Vaginal irritation a couple of days ago. Patient denies trying any medication over the counter.    Recurrent Skin Infections    Patient says she is requesting a refill on her foot fungal cream, as she sweats a lot throughout the day.    RASH Duration:  chronic  Location: legs  Itching: yes Burning: no Redness: yes Oozing: no Scaling: yes Blisters: no Painful: no Fevers: no Change in detergents/soaps/personal care products: no Recent illness: no Recent travel:no History of same: yes Context: stable Alleviating factors: hydrocortisone cream and lotion/moisturizer Treatments attempted:hydrocortisone cream and lotion/moisturizer Shortness of breath: no  Throat/tongue swelling: no Myalgias/arthralgias: no  VAGINAL DISCHARGE Duration: couple of days Discharge description:  a little, clear   Pruritus: yes Dysuria: no Malodorous: no Urinary frequency: no Fevers: no Abdominal pain: no  Sexual activity: not concerned about STIs History of sexually transmitted diseases: no Recent antibiotic use: no Context: worse  Treatments attempted: none   Relevant past medical, surgical, family and social history reviewed and updated as indicated. Interim medical history since our last visit reviewed. Allergies and medications reviewed and updated.  Review of Systems  Constitutional: Negative.   Respiratory: Negative.    Cardiovascular: Negative.   Musculoskeletal: Negative.   Skin:  Positive for rash.  Negative for color change, pallor and wound.  Psychiatric/Behavioral: Negative.      Per HPI unless specifically indicated above     Objective:    There were no vitals taken for this visit.  Wt Readings from Last 3 Encounters:  06/20/23 (!) 304 lb 7.3 oz (138.1 kg)  05/20/23 (!) 304 lb 6.4 oz (138.1 kg)  05/12/23 (!) 310 lb 9.6 oz (140.9 kg)    Physical Exam Vitals and nursing note reviewed.  Constitutional:      General: She is not in acute distress.    Appearance: Normal appearance. She is not ill-appearing, toxic-appearing or diaphoretic.  HENT:     Head: Normocephalic and atraumatic.     Right Ear: External ear normal.     Left Ear: External ear normal.     Nose: Nose normal.     Mouth/Throat:     Mouth: Mucous membranes are moist.     Pharynx: Oropharynx is clear.  Eyes:     General: No scleral icterus.       Right eye: No discharge.        Left eye: No discharge.     Conjunctiva/sclera: Conjunctivae normal.     Pupils: Pupils are equal, round, and reactive to light.  Pulmonary:     Effort: Pulmonary effort is normal. No respiratory distress.     Comments: Speaking in full sentences Musculoskeletal:        General: Normal range of motion.     Cervical back: Normal range of motion.  Skin:    Coloration: Skin is not jaundiced or pale.     Findings: No  bruising, erythema, lesion or rash.  Neurological:     Mental Status: She is alert and oriented to person, place, and time. Mental status is at baseline.  Psychiatric:        Mood and Affect: Mood normal.        Behavior: Behavior normal.        Thought Content: Thought content normal.        Judgment: Judgment normal.     Results for orders placed or performed during the hospital encounter of 06/20/23  Group A Strep by PCR ([facility] Only)   Collection Time: 06/20/23  6:47 PM   Specimen: Anterior Nasal Swab; Sterile Swab  Result Value Ref Range   Group A Strep by PCR NOT DETECTED NOT DETECTED  SARS Coronavirus 2  by RT PCR (hospital order, performed in [facility] lab) *cepheid single result test* Anterior Nasal Swab   Collection Time: 06/20/23  6:47 PM   Specimen: Anterior Nasal Swab  Result Value Ref Range   SARS Coronavirus 2 by RT PCR NEGATIVE NEGATIVE  CBC with Differential   Collection Time: 06/20/23  8:32 PM  Result Value Ref Range   WBC 6.8 4.0 - 10.5 K/uL   RBC 4.27 3.87 - 5.11 MIL/uL   Hemoglobin 11.0 (L) 12.0 - 15.0 g/dL   HCT 16.1 (L) 09.6 - 04.5 %   MCV 81.5 80.0 - 100.0 fL   MCH 25.8 (L) 26.0 - 34.0 pg   MCHC 31.6 30.0 - 36.0 g/dL   RDW 40.9 81.1 - 91.4 %   Platelets 200 150 - 400 K/uL   nRBC 0.0 0.0 - 0.2 %   Neutrophils Relative % 60 %   Neutro Abs 4.2 1.7 - 7.7 K/uL   Lymphocytes Relative 23 %   Lymphs Abs 1.6 0.7 - 4.0 K/uL   Monocytes Relative 14 %   Monocytes Absolute 0.9 0.1 - 1.0 K/uL   Eosinophils Relative 2 %   Eosinophils Absolute 0.1 0.0 - 0.5 K/uL   Basophils Relative 1 %   Basophils Absolute 0.0 0.0 - 0.1 K/uL   Immature Granulocytes 0 %   Abs Immature Granulocytes 0.01 0.00 - 0.07 K/uL  Comprehensive metabolic panel   Collection Time: 06/20/23  8:32 PM  Result Value Ref Range   Sodium 132 (L) 135 - 145 mmol/L   Potassium 3.1 (L) 3.5 - 5.1 mmol/L   Chloride 102 98 - 111 mmol/L   CO2 21 (L) 22 - 32 mmol/L   Glucose, Bld 102 (H) 70 - 99 mg/dL   BUN 10 6 - 20 mg/dL   Creatinine, Ser 7.82 0.44 - 1.00 mg/dL   Calcium 8.7 (L) 8.9 - 10.3 mg/dL   Total Protein 8.4 (H) 6.5 - 8.1 g/dL   Albumin 4.0 3.5 - 5.0 g/dL   AST 22 15 - 41 U/L   ALT 20 0 - 44 U/L   Alkaline Phosphatase 76 38 - 126 U/L   Total Bilirubin 1.4 (H) 0.3 - 1.2 mg/dL   GFR, Estimated >95 >62 mL/min   Anion gap 9 5 - 15  Lactic acid, plasma   Collection Time: 06/20/23  8:32 PM  Result Value Ref Range   Lactic Acid, Venous 0.9 0.5 - 1.9 mmol/L      Assessment & Plan:   Problem List Items Addressed This Visit       Musculoskeletal and Integument   Flexural eczema - Primary    Not resolving. Will change triamcinalone to clobetasol and recheck  in 1 month at follow up. Call with any concerns.       Other Visit Diagnoses       Vaginal discharge       Will treat for BV- if not better, will come in for swab on Monday. Call with any concerns.   Relevant Orders   WET PREP FOR TRICH, YEAST, CLUE        Follow up plan: Return for As scheduled.    This visit was completed via video visit through MyChart due to the restrictions of the COVID-19 pandemic. All issues as above were discussed and addressed. Physical exam was done as above through visual confirmation on video through MyChart. If it was felt that the patient should be evaluated in the office, they were directed there. The patient verbally consented to this visit. Location of the patient: home Location of the provider: work Those involved with this call:  Provider: Olevia Perches, DO CMA: Malen Gauze, CMA Front Desk/Registration:  Servando Snare   Time spent on call:  25 minutes with patient face to face via video conference. More than 50% of this time was spent in counseling and coordination of care. 40 minutes total spent in review of patient's record and preparation of their chart.

## 2023-12-26 NOTE — Assessment & Plan Note (Signed)
Not resolving. Will change triamcinalone to clobetasol and recheck in 1 month at follow up. Call with any concerns.

## 2024-01-26 ENCOUNTER — Ambulatory Visit: Payer: 59 | Admitting: Family Medicine

## 2024-02-06 ENCOUNTER — Ambulatory Visit: Payer: 59 | Admitting: Family Medicine

## 2024-02-07 ENCOUNTER — Telehealth: Admitting: Family Medicine

## 2024-02-07 DIAGNOSIS — J111 Influenza due to unidentified influenza virus with other respiratory manifestations: Secondary | ICD-10-CM | POA: Diagnosis not present

## 2024-02-07 MED ORDER — OSELTAMIVIR PHOSPHATE 75 MG PO CAPS: 75.0000 mg | ORAL_CAPSULE | Freq: Two times a day (BID) | ORAL | 0 refills | Status: AC

## 2024-02-07 MED ORDER — BENZONATATE 200 MG PO CAPS: 200.0000 mg | ORAL_CAPSULE | Freq: Two times a day (BID) | ORAL | 0 refills | Status: DC | PRN

## 2024-02-07 NOTE — Progress Notes (Signed)

## 2024-02-13 ENCOUNTER — Ambulatory Visit: Payer: 59 | Admitting: Family Medicine

## 2024-03-16 ENCOUNTER — Ambulatory Visit: Payer: Self-pay | Admitting: Family Medicine

## 2024-03-21 DIAGNOSIS — S86012A Strain of left Achilles tendon, initial encounter: Secondary | ICD-10-CM | POA: Diagnosis not present

## 2024-03-21 DIAGNOSIS — I10 Essential (primary) hypertension: Secondary | ICD-10-CM | POA: Diagnosis not present

## 2024-04-05 DIAGNOSIS — S93402D Sprain of unspecified ligament of left ankle, subsequent encounter: Secondary | ICD-10-CM | POA: Diagnosis not present

## 2024-04-30 ENCOUNTER — Ambulatory Visit (INDEPENDENT_AMBULATORY_CARE_PROVIDER_SITE_OTHER)

## 2024-04-30 ENCOUNTER — Encounter: Payer: Self-pay | Admitting: Podiatry

## 2024-04-30 ENCOUNTER — Ambulatory Visit (INDEPENDENT_AMBULATORY_CARE_PROVIDER_SITE_OTHER): Admitting: Podiatry

## 2024-04-30 VITALS — Ht 67.0 in | Wt 304.5 lb

## 2024-04-30 DIAGNOSIS — M722 Plantar fascial fibromatosis: Secondary | ICD-10-CM

## 2024-04-30 DIAGNOSIS — M7752 Other enthesopathy of left foot: Secondary | ICD-10-CM

## 2024-04-30 DIAGNOSIS — M65972 Unspecified synovitis and tenosynovitis, left ankle and foot: Secondary | ICD-10-CM

## 2024-04-30 MED ORDER — BETAMETHASONE SOD PHOS & ACET 6 (3-3) MG/ML IJ SUSP
3.0000 mg | Freq: Once | INTRAMUSCULAR | Status: AC
Start: 2024-04-30 — End: 2024-04-30
  Administered 2024-04-30: 3 mg via INTRA_ARTICULAR

## 2024-04-30 MED ORDER — METHYLPREDNISOLONE 4 MG PO TBPK: ORAL_TABLET | ORAL | 0 refills | Status: DC

## 2024-04-30 MED ORDER — MELOXICAM 15 MG PO TABS: 15.0000 mg | ORAL_TABLET | Freq: Every day | ORAL | 1 refills | Status: DC

## 2024-04-30 NOTE — Progress Notes (Signed)
 Chief Complaint  Patient presents with   Ankle Injury    Pt states she sprain her left ankle a few weeks ago and has been in a boot ever since states she is still in pain.    Subjective: 33 y.o. female presenting to the office today for evaluation of pain and tenderness to the left foot and ankle ongoing for about 6 weeks now.  She sustained an injury at work about 6 weeks ago.  2 weeks after the injury she went to an urgent care and a soft cast was provided.  There was no improvement and so she returned 2 weeks later and they placed her in a cam boot.  Recommended follow-up.  Presenting for further treatment evaluation   Past Medical History:  Diagnosis Date   Anemia    Anxiety    Bipolar 1 disorder (HCC)    Chronic hepatitis B affecting antepartum care of mother [facility]) 12/19/2017   Depression    GERD (gastroesophageal reflux disease)    NO MEDS   Headache    Hepatitis B affecting pregnancy 2007   No current treatment required, titers show up negative   Hypertension    Past Surgical History:  Procedure Laterality Date   LAPAROSCOPIC TUBAL LIGATION N/A 07/30/2018   Procedure: LAPAROSCOPIC TUBAL LIGATION;  Surgeon: Darl Edu, MD;  Location: [facility] ORS;  Service: Gynecology;  Laterality: N/A;   NO PAST SURGERIES     NO PAST SURGERIES     TUBAL LIGATION  07/30/2018   [facility])   Allergies  Allergen Reactions   Penicillins Swelling    Has patient had a PCN reaction causing immediate rash, facial/tongue/throat swelling, SOB or lightheadedness with hypotension: No Has patient had a PCN reaction causing severe rash involving mucus membranes or skin necrosis: No Has patient had a PCN reaction that required hospitalization No Has patient had a PCN reaction occurring within the last 10 years: No If all of the above answers are "NO", then may proceed with Cephalosporin use.     Objective: Physical Exam General: The patient is alert and oriented x3 in no acute  distress.  Dermatology: Skin is warm, dry and supple bilateral lower extremities. Negative for open lesions or macerations bilateral.   Vascular: Dorsalis Pedis and Posterior Tibial pulses palpable bilateral.  Capillary fill time is immediate to all digits.  Neurological: Grossly intact via light touch  Musculoskeletal: Pain to palpation to the plantar aspect of the left heel along the plantar fascia as well as the anterolateral aspect of the left ankle. All other joints range of motion within normal limits bilateral. Strength 5/5 in all groups bilateral.   Radiographic exam LT ankle 04/30/2024: Normal osseous mineralization. Joint spaces preserved. No fracture/dislocation/boney destruction. No other soft tissue abnormalities or radiopaque foreign bodies. Two plantar heel spurs noted on lateral view  Assessment: 1. Plantar fasciitis left foot 2.  Ankle sprain/synovitis left 3. H/o LT foot injury - middle of April 2025  Plan of Care:  -Patient evaluated.  X-rays reviewed -Injection of 0.5 cc Celestone  Soluspan injected into the plantar fascia left as well as the anterolateral aspect of the left ankle -Prescription for Medrol Dosepak -Prescription for meloxicam 15 mg daily after completion of the Dosepak -Patient may transition out of the cam boot to good supportive tennis shoes and sneakers.  Advised against going barefoot -Stressed the importance of ankle joint dorsiflexion.  Currently her foot is in a very guarded position and plantarflexed -Night splint dispensed to keep  the foot in a 90 degree angle while sleeping -Return to clinic 3 weeks  *works at a daycare   Dot Gazella, DPM [facility]  Dr. Dot Gazella, DPM    [address] [place], [place] [address]                Office [phone]  Fax [phone]

## 2024-05-21 ENCOUNTER — Ambulatory Visit: Admitting: Podiatry

## 2024-06-04 ENCOUNTER — Ambulatory Visit: Admitting: Family Medicine

## 2024-06-18 ENCOUNTER — Encounter: Admitting: Family Medicine

## 2024-06-23 ENCOUNTER — Encounter: Payer: Self-pay | Admitting: Family Medicine

## 2024-06-23 ENCOUNTER — Ambulatory Visit: Admitting: Family Medicine

## 2024-06-23 VITALS — BP 144/102 | HR 74 | Temp 98.4°F | Resp 15 | Ht 67.01 in | Wt 315.2 lb

## 2024-06-23 DIAGNOSIS — Z800 Family history of malignant neoplasm of digestive organs: Secondary | ICD-10-CM | POA: Diagnosis not present

## 2024-06-23 DIAGNOSIS — Z6841 Body Mass Index (BMI) 40.0 and over, adult: Secondary | ICD-10-CM

## 2024-06-23 DIAGNOSIS — I10 Essential (primary) hypertension: Secondary | ICD-10-CM

## 2024-06-23 DIAGNOSIS — Z0000 Encounter for general adult medical examination without abnormal findings: Secondary | ICD-10-CM

## 2024-06-23 DIAGNOSIS — Z803 Family history of malignant neoplasm of breast: Secondary | ICD-10-CM | POA: Diagnosis not present

## 2024-06-23 LAB — MICROALBUMIN, URINE WAIVED
Creatinine, Urine Waived: 300 mg/dL (ref 10–300)
Microalb, Ur Waived: 150 mg/L — ABNORMAL HIGH (ref 0–19)

## 2024-06-23 MED ORDER — WEGOVY 0.5 MG/0.5ML ~~LOC~~ SOAJ: 0.5000 mg | SUBCUTANEOUS | 1 refills | Status: AC

## 2024-06-23 MED ORDER — WEGOVY 0.25 MG/0.5ML ~~LOC~~ SOAJ: 0.2500 mg | SUBCUTANEOUS | 0 refills | Status: AC

## 2024-06-23 NOTE — Assessment & Plan Note (Signed)
 Would benefit from wegovy - has been doing significant diet and exercise and has lost 20lbs on her own. Will start her on wegovy . Follow up 6 weeks.

## 2024-06-23 NOTE — Assessment & Plan Note (Signed)
 Mom diagnosed at 62, niece diagnosed at 35yo. Will get her into genetics. Await their input.

## 2024-06-23 NOTE — Assessment & Plan Note (Signed)
 Father was diagnosed at 81 or 75- will get her colonoscopy at 60- will get her into genetics due to several family members with cancer.

## 2024-06-23 NOTE — Progress Notes (Unsigned)
 BP (!) 144/102   Pulse 74   Temp 98.4 F (36.9 C) (Oral)   Resp 15   Ht 5' 7.01 (1.702 m)   Wt (!) 315 lb 3.2 oz (143 kg)   LMP 06/21/2024 (Exact Date)   SpO2 98%   BMI 49.36 kg/m    Subjective:    Patient ID: Regina Chandler, female    DOB: 12-23-1990, 33 y.o.   MRN: [id number]  HPI: Regina Chandler is a 33 y.o. female presenting on 06/23/2024 for comprehensive medical examination. Current medical complaints include:  HYPERTENSION  Hypertension status: uncontrolled  Satisfied with current treatment? yes Duration of hypertension: chronic BP monitoring frequency:  not checking BP medication side effects:  no Medication compliance: excellent compliance Previous BP meds:lisinopril -HCTZ Aspirin: no Recurrent headaches: no Visual changes: no Palpitations: no Dyspnea: no Chest pain: no Lower extremity edema: yes Dizzy/lightheaded: no  WEIGHT GAIN Duration: chronic Previous attempts at weight loss: yes- diet, exercise 2x a week, walking daily, portion control,  Complications of obesity: HTN, depression, GERD Peak weight: 335lbs Weight loss goal: 250lbs Weight loss to date: 20lbs Requesting obesity pharmacotherapy: yes Current weight loss supplements/medications: no Previous weight loss supplements/meds: yes- saxenda   Clinical coverage for weight loss GLP's  Medication being dispensed is Wegovy  3 mL/28 day. Titration doses are 2 mL/28 days.  [x]  Product being prescribed is FDA approved for the indication, age, weight (if applicable) and not does not exceed dosing limits per the Prescribing Information per the clinical conditions for use.  [x]  Patient's baseline weight measured within the last 45 days as required by provider before dispensing.  [x]  Patient is new to therapy and One of the following:   [x]  The beneficiary is 35 years of age or over and has ONE of the following:  [x]  A BMI greater than or equal to 30 kg/m2  [x]  A BMI greater than or equal to  27 kg/m2 with at least one weight-related comorbidity/risk factor/complication (i.e. hypertension, type 2 diabetes, obstructive sleep apnea, cardiovascular disease, dyslipidemia)  [x]   If patient has one weight-related comorbidity/risk factor/complication (i.e. hypertension, type 2 diabetes, obstructive sleep apnea, cardiovascular disease, dyslipidemia), please list  Patient suffers from weight-related comorbidity/risk factor/complication: HTN, depression, GERD  []  The beneficiary is 2 years of age or older with a BMI greater than or equal to 27 kg/m2 AND has established cardiovascular disease (CVD) defined as having a history of myocardial infarction, stroke, or symptomatic peripheral disease, to be documented on the PA form. AND  [x]  The beneficiary is currently on and will continue lifestyle modification including structured nutrition and physical activity, unless physical activity is not clinically appropriate at the time GLP1 therapy commences AND  [x]  The beneficiary will NOT be using the requested agent in combination with another GLP-1 receptor agonist agent AND  [x]  The beneficiary does NOT have any FDA-labeled contraindications to the requested agent, including pregnancy, lactation, history of medullary thyroid cancer or multiple endocrine neoplasia type II.   Last BMI/Weight/Height recorded Estimated body mass index is 49.36 kg/m as calculated from the following:   Height as of this encounter: 5' 7.01 (1.702 m).   Weight as of this encounter: 315 lb 3.2 oz (143 kg).   Menopausal Symptoms: no  Depression Screen done today and results listed below:     06/23/2024    1:14 PM 06/10/2023   10:25 AM 05/20/2023   11:02 AM 05/12/2023   10:30 AM 09/18/2018    8:58 AM  Depression screen PHQ  2/9  Decreased Interest 3 0 0 0 0  Down, Depressed, Hopeless 0 0 0 1 0  PHQ - 2 Score 3 0 0 1 0  Altered sleeping 2 0 0 2 1  Tired, decreased energy 2 0 0 2 1  Change in appetite 0 0 0 0 0   Feeling bad or failure about yourself  0 0 0 1 0  Trouble concentrating 0 0 0 0 0  Moving slowly or fidgety/restless 0 0 0 0 0  Suicidal thoughts 0 0 0 0 0  PHQ-9 Score 7 0 0 6 2  Difficult doing work/chores  Not difficult at all Not difficult at all Not difficult at all      Past Medical History:  Past Medical History:  Diagnosis Date   Anemia    Anxiety    Bipolar 1 disorder (HCC)    Chronic hepatitis B affecting antepartum care of mother [facility]) 12/19/2017   Depression    GERD (gastroesophageal reflux disease)    NO MEDS   Headache    Hepatitis B affecting pregnancy 2007   No current treatment required, titers show up negative   Hypertension     Surgical History:  Past Surgical History:  Procedure Laterality Date   LAPAROSCOPIC TUBAL LIGATION N/A 07/30/2018   Procedure: LAPAROSCOPIC TUBAL LIGATION;  Surgeon: Lake Read, MD;  Location: [facility] ORS;  Service: Gynecology;  Laterality: N/A;   NO PAST SURGERIES     NO PAST SURGERIES     TUBAL LIGATION  07/30/2018   Regina Chandler)    Medications:  Current Outpatient Medications on File Prior to Visit  Medication Sig   acetaminophen  (TYLENOL ) 500 MG tablet Take 1 tablet (500 mg total) by mouth every 6 (six) hours as needed.   albuterol  (VENTOLIN  HFA) 108 (90 Base) MCG/ACT inhaler Inhale 2 puffs into the lungs every 6 (six) hours as needed for wheezing or shortness of breath.   lisinopril -hydrochlorothiazide  (ZESTORETIC ) 20-25 MG tablet Take 1 tablet by mouth daily.   terbinafine  (LAMISIL ) 250 MG tablet Take 1 tablet (250 mg total) by mouth daily.   clobetasol  ointment (TEMOVATE ) 0.05 % Apply 1 Application topically 2 (two) times daily.   No current facility-administered medications on file prior to visit.    Allergies:  Allergies  Allergen Reactions   Penicillins Swelling    Has patient had a PCN reaction causing immediate rash, facial/tongue/throat swelling, SOB or lightheadedness with hypotension: No Has  patient had a PCN reaction causing severe rash involving mucus membranes or skin necrosis: No Has patient had a PCN reaction that required hospitalization No Has patient had a PCN reaction occurring within the last 10 years: No If all of the above answers are NO, then may proceed with Cephalosporin use.    Social History:  Social History   Socioeconomic History   Marital status: Single    Spouse name: Not on file   Number of children: Not on file   Years of education: Not on file   Highest education level: Some college, no degree  Occupational History   Not on file  Tobacco Use   Smoking status: Never   Smokeless tobacco: Never  Vaping Use   Vaping status: Never Used  Substance and Sexual Activity   Alcohol use: Not Currently    Comment: RARE   Drug use: No   Sexual activity: Yes    Birth control/protection: Surgical    Comment: Tubal ligation  Other Topics Concern   Not on file  Social History Narrative   Not on file   Social Drivers of Health   Financial Resource Strain: Low Risk  (02/12/2024)   Overall Financial Resource Strain (CARDIA)    Difficulty of Paying Living Expenses: Not very hard  Food Insecurity: No Food Insecurity (02/12/2024)   Hunger Vital Sign    Worried About Running Out of Food in the Last Year: Never true    Ran Out of Food in the Last Year: Never true  Transportation Needs: No Transportation Needs (02/12/2024)   PRAPARE - Administrator, Civil Service (Medical): No    Lack of Transportation (Non-Medical): No  Physical Activity: Sufficiently Active (02/12/2024)   Exercise Vital Sign    Days of Exercise per Week: 7 days    Minutes of Exercise per Session: 60 min  Stress: No Stress Concern Present (02/12/2024)   [employer] of Occupational Health - Occupational Stress Questionnaire    Feeling of Stress : Only a little  Social Connections: Moderately Integrated (02/12/2024)   Social Connection and Isolation Panel    Frequency of  Communication with Friends and Family: More than three times a week    Frequency of Social Gatherings with Friends and Family: Once a week    Attends Religious Services: More than 4 times per year    Active Member of [employer] or Organizations: Yes    Attends Banker Meetings: 1 to 4 times per year    Marital Status: Never married  Catering manager Violence: Not on file   Social History   Tobacco Use  Smoking Status Never  Smokeless Tobacco Never   Social History   Substance and Sexual Activity  Alcohol Use Not Currently   Comment: RARE    Family History:  Family History  Problem Relation Age of Onset   Breast cancer Mother    Colon cancer Father    Hypertension Sister    Hypertension Brother    Hypertension Maternal Grandmother    Diabetes Maternal Grandmother    Hypertension Maternal Grandfather    Hypertension Paternal Grandmother    Hypertension Paternal Grandfather    Cancer Neg Hx    Heart disease Neg Hx    Stroke Neg Hx     Past medical history, surgical history, medications, allergies, family history and social history reviewed with patient today and changes made to appropriate areas of the chart.   Review of Systems  Constitutional: Negative.   Eyes: Negative.   Respiratory: Negative.    Cardiovascular:  Positive for leg swelling. Negative for chest pain, palpitations, orthopnea, claudication and PND.  Gastrointestinal:  Positive for heartburn. Negative for abdominal pain, blood in stool, constipation, diarrhea, melena, nausea and vomiting.  Musculoskeletal:  Positive for myalgias. Negative for back pain, falls, joint pain and neck pain.  Neurological: Negative.   Endo/Heme/Allergies: Negative.   Psychiatric/Behavioral: Negative.     All other ROS negative except what is listed above and in the HPI.      Objective:    BP (!) 144/102   Pulse 74   Temp 98.4 F (36.9 C) (Oral)   Resp 15   Ht 5' 7.01 (1.702 m)   Wt (!) 315 lb 3.2 oz (143  kg)   LMP 06/21/2024 (Exact Date)   SpO2 98%   BMI 49.36 kg/m   Wt Readings from Last 3 Encounters:  06/23/24 (!) 315 lb 3.2 oz (143 kg)  04/30/24 (!) 304 lb 7.4 oz (138.1 kg)  06/20/23 (!) 304 lb 7.3  oz (138.1 kg)    Physical Exam Vitals and nursing note reviewed.  Constitutional:      General: She is not in acute distress.    Appearance: Normal appearance. She is obese. She is not ill-appearing, toxic-appearing or diaphoretic.  HENT:     Head: Normocephalic and atraumatic.     Right Ear: Tympanic membrane, ear canal and external ear normal. There is no impacted cerumen.     Left Ear: Tympanic membrane, ear canal and external ear normal. There is no impacted cerumen.     Nose: Nose normal. No congestion or rhinorrhea.     Mouth/Throat:     Mouth: Mucous membranes are moist.     Pharynx: Oropharynx is clear. No oropharyngeal exudate or posterior oropharyngeal erythema.  Eyes:     General: No scleral icterus.       Right eye: No discharge.        Left eye: No discharge.     Extraocular Movements: Extraocular movements intact.     Conjunctiva/sclera: Conjunctivae normal.     Pupils: Pupils are equal, round, and reactive to light.  Neck:     Vascular: No carotid bruit.  Cardiovascular:     Rate and Rhythm: Normal rate and regular rhythm.     Pulses: Normal pulses.     Heart sounds: No murmur heard.    No friction rub. No gallop.  Pulmonary:     Effort: Pulmonary effort is normal. No respiratory distress.     Breath sounds: Normal breath sounds. No stridor. No wheezing, rhonchi or rales.  Chest:     Chest wall: No tenderness.  Abdominal:     General: Abdomen is flat. Bowel sounds are normal. There is no distension.     Palpations: Abdomen is soft. There is no mass.     Tenderness: There is no abdominal tenderness. There is no right CVA tenderness, left CVA tenderness, guarding or rebound.     Hernia: No hernia is present.  Genitourinary:    Comments: Breast and pelvic  exams deferred with shared decision making Musculoskeletal:        General: No swelling, tenderness, deformity or signs of injury.     Cervical back: Normal range of motion and neck supple. No rigidity. No muscular tenderness.     Right lower leg: No edema.     Left lower leg: No edema.  Lymphadenopathy:     Cervical: No cervical adenopathy.  Skin:    General: Skin is warm and dry.     Capillary Refill: Capillary refill takes less than 2 seconds.     Coloration: Skin is not jaundiced or pale.     Findings: No bruising, erythema, lesion or rash.  Neurological:     General: No focal deficit present.     Mental Status: She is alert and oriented to person, place, and time. Mental status is at baseline.     Cranial Nerves: No cranial nerve deficit.     Sensory: No sensory deficit.     Motor: No weakness.     Coordination: Coordination normal.     Gait: Gait normal.     Deep Tendon Reflexes: Reflexes normal.  Psychiatric:        Mood and Affect: Mood normal.        Behavior: Behavior normal.        Thought Content: Thought content normal.        Judgment: Judgment normal.     Results for orders placed or  performed during the hospital encounter of 06/20/23  Group A Strep by PCR ([facility] Only)   Collection Time: 06/20/23  6:47 PM   Specimen: Anterior Nasal Swab; Sterile Swab  Result Value Ref Range   Group A Strep by PCR NOT DETECTED NOT DETECTED  SARS Coronavirus 2 by RT PCR (hospital order, performed in [facility] hospital lab) *cepheid single result test* Anterior Nasal Swab   Collection Time: 06/20/23  6:47 PM   Specimen: Anterior Nasal Swab  Result Value Ref Range   SARS Coronavirus 2 by RT PCR NEGATIVE NEGATIVE  CBC with Differential   Collection Time: 06/20/23  8:32 PM  Result Value Ref Range   WBC 6.8 4.0 - 10.5 K/uL   RBC 4.27 3.87 - 5.11 MIL/uL   Hemoglobin 11.0 (L) 12.0 - 15.0 g/dL   HCT 65.1 (L) 63.9 - 53.9 %   MCV 81.5 80.0 - 100.0 fL   MCH 25.8 (L) 26.0 - 34.0 pg    MCHC 31.6 30.0 - 36.0 g/dL   RDW 85.4 88.4 - 84.4 %   Platelets 200 150 - 400 K/uL   nRBC 0.0 0.0 - 0.2 %   Neutrophils Relative % 60 %   Neutro Abs 4.2 1.7 - 7.7 K/uL   Lymphocytes Relative 23 %   Lymphs Abs 1.6 0.7 - 4.0 K/uL   Monocytes Relative 14 %   Monocytes Absolute 0.9 0.1 - 1.0 K/uL   Eosinophils Relative 2 %   Eosinophils Absolute 0.1 0.0 - 0.5 K/uL   Basophils Relative 1 %   Basophils Absolute 0.0 0.0 - 0.1 K/uL   Immature Granulocytes 0 %   Abs Immature Granulocytes 0.01 0.00 - 0.07 K/uL  Comprehensive metabolic panel   Collection Time: 06/20/23  8:32 PM  Result Value Ref Range   Sodium 132 (L) 135 - 145 mmol/L   Potassium 3.1 (L) 3.5 - 5.1 mmol/L   Chloride 102 98 - 111 mmol/L   CO2 21 (L) 22 - 32 mmol/L   Glucose, Bld 102 (H) 70 - 99 mg/dL   BUN 10 6 - 20 mg/dL   Creatinine, Ser 9.07 0.44 - 1.00 mg/dL   Calcium 8.7 (L) 8.9 - 10.3 mg/dL   Total Protein 8.4 (H) 6.5 - 8.1 g/dL   Albumin 4.0 3.5 - 5.0 g/dL   AST 22 15 - 41 U/L   ALT 20 0 - 44 U/L   Alkaline Phosphatase 76 38 - 126 U/L   Total Bilirubin 1.4 (H) 0.3 - 1.2 mg/dL   GFR, Estimated >39 >39 mL/min   Anion gap 9 5 - 15  Lactic acid, plasma   Collection Time: 06/20/23  8:32 PM  Result Value Ref Range   Lactic Acid, Venous 0.9 0.5 - 1.9 mmol/L      Assessment & Plan:   Problem List Items Addressed This Visit       Cardiovascular and Mediastinum   Essential hypertension   BP running high. Will hold on changing medication as we're starting wegovy . Recheck 6 weeks.       Relevant Orders   Microalbumin, Urine Waived     Other   BMI 45.0-49.9, adult (HCC)   Would benefit from wegovy - has been doing significant diet and exercise and has lost 20lbs on her own. Will start her on wegovy . Follow up 6 weeks.       Relevant Medications   Semaglutide -Weight Management (WEGOVY ) 0.25 MG/0.5ML SOAJ   Semaglutide -Weight Management (WEGOVY ) 0.5 MG/0.5ML SOAJ (Start on 07/21/2024)  Family history of  breast cancer   Mom diagnosed at 70, niece diagnosed at 34yo. Will get her into genetics. Await their input.       Relevant Orders   Ambulatory referral to Genetics   Family history of colon cancer in father   Father was diagnosed at 36 or 35- will get her colonoscopy at 35- will get her into genetics due to several family members with cancer.       Relevant Orders   Ambulatory referral to Genetics   Other Visit Diagnoses       Routine general medical examination at a health care facility    -  Primary   Vaccines up to date. Screening labs checked today. Pap to be done next 2 weeks. Call with any concerns. Continue to monitor.   Relevant Orders   CBC with Differential/Platelet   Comprehensive metabolic panel with GFR   Lipid Panel w/o Chol/HDL Ratio   TSH   Hepatitis B surface antibody,quantitative        Follow up plan: Return for As scheduled, 6 weeks.   LABORATORY TESTING:  - Pap smear: on cycle- will do in 1 month  IMMUNIZATIONS:   - Tdap: Tetanus vaccination status reviewed: last tetanus booster within 10 years. - Influenza: Postponed to flu season - Pneumovax: Not applicable - Prevnar: Not applicable - COVID: Refused - HPV: Administered today   PATIENT COUNSELING:   Advised to take 1 mg of folate supplement per day if capable of pregnancy.   Sexuality: Discussed sexually transmitted diseases, partner selection, use of condoms, avoidance of unintended pregnancy  and contraceptive alternatives.   Advised to avoid cigarette smoking.  I discussed with the patient that most people either abstain from alcohol or drink within safe limits (<=14/week and <=4 drinks/occasion for males, <=7/weeks and <= 3 drinks/occasion for females) and that the risk for alcohol disorders and other health effects rises proportionally with the number of drinks per week and how often a drinker exceeds daily limits.  Discussed cessation/primary prevention of drug use and availability of  treatment for abuse.   Diet: Encouraged to adjust caloric intake to maintain  or achieve ideal body weight, to reduce intake of dietary saturated fat and total fat, to limit sodium intake by avoiding high sodium foods and not adding table salt, and to maintain adequate dietary potassium and calcium preferably from fresh fruits, vegetables, and low-fat dairy products.    stressed the importance of regular exercise  Injury prevention: Discussed safety belts, safety helmets, smoke detector, smoking near bedding or upholstery.   Dental health: Discussed importance of regular tooth brushing, flossing, and dental visits.    NEXT PREVENTATIVE PHYSICAL DUE IN 1 YEAR. Return for As scheduled, 6 weeks.

## 2024-06-23 NOTE — Assessment & Plan Note (Signed)
 BP running high. Will hold on changing medication as we're starting wegovy . Recheck 6 weeks.

## 2024-06-24 ENCOUNTER — Encounter: Payer: Self-pay | Admitting: Family Medicine

## 2024-06-24 DIAGNOSIS — Z23 Encounter for immunization: Secondary | ICD-10-CM | POA: Diagnosis not present

## 2024-06-24 LAB — LIPID PANEL W/O CHOL/HDL RATIO
Cholesterol, Total: 140 mg/dL (ref 100–199)
HDL: 43 mg/dL (ref >39)
LDL Chol Calc (NIH): 77 mg/dL (ref 0–99)
Triglycerides: 110 mg/dL (ref 0–149)
VLDL Cholesterol Cal: 20 mg/dL (ref 5–40)

## 2024-06-24 LAB — COMPREHENSIVE METABOLIC PANEL WITH GFR
ALT: 15 IU/L (ref 0–32)
AST: 20 IU/L (ref 0–40)
Albumin: 4.2 g/dL (ref 3.9–4.9)
Alkaline Phosphatase: 87 IU/L (ref 44–121)
BUN/Creatinine Ratio: 13 (ref 9–23)
BUN: 11 mg/dL (ref 6–20)
Bilirubin Total: 0.7 mg/dL (ref 0.0–1.2)
CO2: 21 mmol/L (ref 20–29)
Calcium: 8.9 mg/dL (ref 8.7–10.2)
Chloride: 106 mmol/L (ref 96–106)
Creatinine, Ser: 0.88 mg/dL (ref 0.57–1.00)
Globulin, Total: 3 g/dL (ref 1.5–4.5)
Glucose: 80 mg/dL (ref 70–99)
Potassium: 4.1 mmol/L (ref 3.5–5.2)
Sodium: 145 mmol/L — ABNORMAL HIGH (ref 134–144)
Total Protein: 7.2 g/dL (ref 6.0–8.5)
eGFR: 88 mL/min/1.73 (ref >59)

## 2024-06-24 LAB — TSH: TSH: 3.05 u[IU]/mL (ref 0.450–4.500)

## 2024-06-24 LAB — HEPATITIS B SURFACE ANTIBODY, QUANTITATIVE: Hepatitis B Surf Ab Quant: 593 m[IU]/mL

## 2024-06-24 LAB — CBC WITH DIFFERENTIAL/PLATELET
Basophils Absolute: 0 x10E3/uL (ref 0.0–0.2)
Basos: 1 %
EOS (ABSOLUTE): 0.2 x10E3/uL (ref 0.0–0.4)
Eos: 3 %
Hematocrit: 36.3 % (ref 34.0–46.6)
Hemoglobin: 11.1 g/dL (ref 11.1–15.9)
Immature Grans (Abs): 0 x10E3/uL (ref 0.0–0.1)
Immature Granulocytes: 0 %
Lymphocytes Absolute: 1.8 x10E3/uL (ref 0.7–3.1)
Lymphs: 32 %
MCH: 26.2 pg — ABNORMAL LOW (ref 26.6–33.0)
MCHC: 30.6 g/dL — ABNORMAL LOW (ref 31.5–35.7)
MCV: 86 fL (ref 79–97)
Monocytes Absolute: 0.4 x10E3/uL (ref 0.1–0.9)
Monocytes: 8 %
Neutrophils Absolute: 3.2 x10E3/uL (ref 1.4–7.0)
Neutrophils: 56 %
Platelets: 245 x10E3/uL (ref 150–450)
RBC: 4.23 x10E6/uL (ref 3.77–5.28)
RDW: 14.7 % (ref 11.7–15.4)
WBC: 5.6 x10E3/uL (ref 3.4–10.8)

## 2024-06-30 ENCOUNTER — Ambulatory Visit: Payer: Self-pay | Admitting: Family Medicine

## 2024-07-13 ENCOUNTER — Encounter: Payer: Self-pay | Admitting: Licensed Clinical Social Worker

## 2024-07-13 ENCOUNTER — Inpatient Hospital Stay: Attending: Oncology | Admitting: Licensed Clinical Social Worker

## 2024-07-13 ENCOUNTER — Other Ambulatory Visit (HOSPITAL_COMMUNITY): Payer: Self-pay

## 2024-07-13 ENCOUNTER — Inpatient Hospital Stay

## 2024-07-13 DIAGNOSIS — Z800 Family history of malignant neoplasm of digestive organs: Secondary | ICD-10-CM

## 2024-07-13 DIAGNOSIS — Z803 Family history of malignant neoplasm of breast: Secondary | ICD-10-CM

## 2024-07-13 NOTE — Progress Notes (Signed)
 REFERRING PROVIDER: Vicci Duwaine SQUIBB, DO [address] [place],  [place] [address]  PRIMARY PROVIDER:  Vicci Duwaine P, DO  PRIMARY REASON FOR VISIT:  1. Family history of breast cancer   2. Family history of colon cancer in father      HISTORY OF PRESENT ILLNESS:   Regina Chandler, a 33 y.o. female, was seen for a [place] cancer genetics consultation at the request of Dr. Vicci due to a family history of cancer.  Regina Chandler presents to clinic today to discuss the possibility of a hereditary predisposition to cancer, genetic testing, and to further clarify her future cancer risks, as well as potential cancer risks for family members.   CANCER HISTORY:  Regina Chandler is a 33 y.o. female with no personal history of cancer.    RELEVANT MEDICAL HISTORY:  Menarche was at age 24.  First live birth at age 68.  Ovaries intact: yes.  Hysterectomy: no.  Menopausal status: premenopausal.  Colonoscopy: no; not examined. Mammogram within the last year: no. Number of breast biopsies: 0.  Past Medical History:  Diagnosis Date   Anemia    Anxiety    Bipolar 1 disorder (HCC)    Chronic hepatitis B affecting antepartum care of mother [facility]) 12/19/2017   Depression    GERD (gastroesophageal reflux disease)    NO MEDS   Headache    Hepatitis B affecting pregnancy 2007   No current treatment required, titers show up negative   Hypertension     Past Surgical History:  Procedure Laterality Date   LAPAROSCOPIC TUBAL LIGATION N/A 07/30/2018   Procedure: LAPAROSCOPIC TUBAL LIGATION;  Surgeon: Lake Read, MD;  Location: [facility] ORS;  Service: Gynecology;  Laterality: N/A;   NO PAST SURGERIES     NO PAST SURGERIES     TUBAL LIGATION  07/30/2018   [facility])    FAMILY HISTORY:  We obtained a detailed, 4-generation family history.  Significant diagnoses are listed below: Family History  Problem Relation Age of Onset   Breast cancer Mother 39   Colon cancer Father 49   Hypertension Sister     Cervical cancer Sister    Hypertension Brother    Cancer Paternal Uncle        unk type   Cancer Paternal Uncle        unk type   Hypertension Maternal Grandmother    Diabetes Maternal Grandmother    Hypertension Maternal Grandfather    Hypertension Paternal Grandmother    Hypertension Paternal Grandfather    Heart disease Neg Hx    Stroke Neg Hx    Regina Chandler has 2 sons and 2 daughters. She has 1 maternal half sister. Her half sister had cervical cancer, and her daughter, patient's niece reportedly had breast cancer at 60. Neither patient's sister or niece have had genetic testing.  Regina Chandler mother is living at 4 and had breast cancer diagnosed at 73. She had not had genetic testing. No other known cancers on this side of the family.  Regina Chandler father had colon cancer at 84 and passed at 33. Patient has 2 paternal uncles who also have cancer, unknown types. Limited information about this side of the family. No other known cancers.  Regina Chandler is unaware of previous family history of genetic testing for hereditary cancer risks. There is no reported Ashkenazi Jewish ancestry. There is no known consanguinity.    GENETIC COUNSELING ASSESSMENT: Regina Chandler is a 33 y.o. female with a family history  of early onset breast cancer and colon cancer which is somewhat suggestive of a hereditary cancer syndrome and predisposition to cancer. We, therefore, discussed and recommended the following at today's visit.   DISCUSSION: We discussed that, in general, most cancer is not inherited in families, but instead is sporadic or familial. Sporadic cancers occur by chance and typically happen at older ages (>50 years) as this type of cancer is caused by genetic changes acquired during an individual's lifetime. We discussed that approximately 10% of cancer is hereditary. Most cases of hereditary breast cancer are associated with BRCA1/2 genes, although there are other genes associated with hereditary  cancer as well. Cancers and risks are gene specific. We discussed that testing is beneficial for several reasons including knowing about cancer risks, identifying potential screening and risk-reduction options that may be appropriate, and to understand if other family members could be at risk for cancer and allow them to undergo genetic testing.   We reviewed the characteristics, features and inheritance patterns of hereditary cancer syndromes. We also discussed genetic testing, including the appropriate family members to test, the process of testing, insurance coverage and turn-around-time for results. We discussed the implications of a negative, positive and/or variant of uncertain significant result. We recommended Regina Chandler pursue genetic testing for the [facility] CancerNext-Expanded+RNA gene panel.   The CancerNext-Expanded gene panel offered by [facility] and includes sequencing, rearrangement, and RNA analysis for the following 77 genes: AIP, ALK, APC, ATM, AXIN2, BAP1, BARD1, BMPR1A, BRCA1, BRCA2, BRIP1, CDC73, CDH1, CDK4, CDKN1B, CDKN2A, CEBPA, CHEK2, CTNNA1, DDX41, DICER1, ETV6, FH, FLCN, GATA2, LZTR1, MAX, MBD4, MEN1, MET, MLH1, MSH2, MSH3, MSH6, MUTYH, NF1, NF2, NTHL1, PALB2, PHOX2B, PMS2, POT1, PRKAR1A, PTCH1, PTEN, RAD51C, RAD51D, RB1, RET, RPS20, RUNX1, SDHA, SDHAF2, SDHB, SDHC, SDHD, SMAD4, SMARCA4, SMARCB1, SMARCE1, STK11, SUFU, TMEM127, TP53, TSC1, TSC2, VHL, and WT1 (sequencing and deletion/duplication); EGFR, HOXB13, KIT, MITF, PDGFRA, POLD1, and POLE (sequencing only); EPCAM and GREM1 (deletion/duplication only).   Based on Regina Chandler's family history of cancer, she meets medical criteria for genetic testing. Though Regina Chandler is not personally affected, there are no affected family members that are willing/able to undergo hereditary cancer testing.  Therefore, Regina Chandler the most informative family member available. Despite that she meets criteria, she may still have an out of pocket cost.    PLAN: After considering the risks, benefits, and limitations, Regina Chandler did not wish to pursue genetic testing at today's visit. We understand this decision and remain available to coordinate genetic testing at any time in the future. We, therefore, recommend Regina Chandler continue to follow the cancer screening guidelines given by her primary healthcare provider.  Regina Chandler questions were answered to her satisfaction today. Our contact information was provided should additional questions or concerns arise. Thank you for the referral and allowing us  to share in the care of your patient.   Dena Cary, MS, [facility] Genetic Counselor [email or web address] Phone: [phone]  40 minutes were spent on the date of the encounter in service to the patient including preparation, face-to-face consultation, documentation and care coordination. Dr. Delinda was available for discussion regarding this case.   _______________________________________________________________________ For Office Staff:  Number of people involved in session: 1 Was an Intern/ student involved with case: no

## 2024-07-14 ENCOUNTER — Other Ambulatory Visit (HOSPITAL_COMMUNITY): Payer: Self-pay

## 2024-07-14 ENCOUNTER — Telehealth: Payer: Self-pay

## 2024-07-14 NOTE — Telephone Encounter (Signed)
 Ok for E2C2 to review.  Left message for patient. Please advise patient that her insurance does not cover medications used for obesity. Sometimes some plans will allow it if she enrolls in their specific programs but she needs to contact her plan for more details.

## 2024-07-14 NOTE — Telephone Encounter (Signed)
 Please let patient know that weight loss medicines are not covered. Thanks!

## 2024-07-14 NOTE — Telephone Encounter (Signed)
 Pharmacy Patient Advocate Encounter   Received notification from CoverMyMeds that prior authorization for Wegovy  0.25MG /0.5ML auto-injectors  is required/requested.   Insurance verification completed.   The patient is insured through [facility] .   Per test claim: product/service not covered. Weight loss drugs not covered

## 2024-07-21 ENCOUNTER — Other Ambulatory Visit: Payer: Self-pay | Admitting: Family Medicine

## 2024-07-23 NOTE — Telephone Encounter (Signed)
 Requested medication (s) are due for refill today: expired medication  Requested medication (s) are on the active medication list: yes   Last refill:  05/12/23 #90 1 refill  Future visit scheduled: yes 08/05/24  Notes to clinic:  expired medication date. Do you want to renew Rx?     Requested Prescriptions  Pending Prescriptions Disp Refills   lisinopril -hydrochlorothiazide  (ZESTORETIC ) 20-25 MG tablet [Pharmacy Med Name: LISINOPRIL -HCTZ 20-25 MG TAB] 90 tablet 1    Sig: TAKE 1 TABLET BY MOUTH EVERY DAY     Cardiovascular:  ACEI + Diuretic Combos Failed - 07/23/2024 12:53 PM      Failed - Na in normal range and within 180 days    Sodium  Date Value Ref Range Status  06/23/2024 145 (H) 134 - 144 mmol/L Final  06/27/2012 141 136 - 145 mmol/L Final         Failed - Last BP in normal range    BP Readings from Last 1 Encounters:  06/23/24 (!) 144/102         Passed - K in normal range and within 180 days    Potassium  Date Value Ref Range Status  06/23/2024 4.1 3.5 - 5.2 mmol/L Final  06/27/2012 3.9 3.5 - 5.1 mmol/L Final         Passed - Cr in normal range and within 180 days    Creatinine  Date Value Ref Range Status  06/27/2012 0.72 0.60 - 1.30 mg/dL Final   Creatinine, Ser  Date Value Ref Range Status  06/23/2024 0.88 0.57 - 1.00 mg/dL Final   Creatinine, Urine  Date Value Ref Range Status  04/03/2018 112 mg/dL Final         Passed - eGFR is 30 or above and within 180 days    EGFR (African American)  Date Value Ref Range Status  06/27/2012 >60  Final   GFR calc Af Amer  Date Value Ref Range Status  02/23/2019 122 >59 mL/min/1.73 Final   EGFR (Non-African Amer.)  Date Value Ref Range Status  06/27/2012 >60  Final    Comment:    eGFR values <43mL/min/1.73 m2 may be an indication of chronic kidney disease (CKD). Calculated eGFR is useful in patients with stable renal function. The eGFR calculation will not be reliable in acutely ill patients when serum  creatinine is changing rapidly. It is not useful in  patients on dialysis. The eGFR calculation may not be applicable to patients at the low and high extremes of body sizes, pregnant women, and vegetarians.    GFR, Estimated  Date Value Ref Range Status  06/20/2023 >60 >60 mL/min Final    Comment:    (NOTE) Calculated using the CKD-EPI Creatinine Equation (2021)    eGFR  Date Value Ref Range Status  06/23/2024 88 >59 mL/min/1.73 Final         Passed - Patient is not pregnant      Passed - Valid encounter within last 6 months    Recent Outpatient Visits           1 month ago Routine general medical examination at a health care facility   [facility], Megan P, DO

## 2024-07-27 ENCOUNTER — Ambulatory Visit: Admitting: Family Medicine

## 2024-08-05 ENCOUNTER — Ambulatory Visit: Admitting: Family Medicine

## 2024-08-10 ENCOUNTER — Ambulatory Visit: Admitting: Family Medicine

## 2024-09-16 ENCOUNTER — Ambulatory Visit: Admitting: Family Medicine

## 2024-10-19 ENCOUNTER — Ambulatory Visit: Admitting: Family Medicine

## 2024-11-15 ENCOUNTER — Encounter: Payer: Self-pay | Admitting: Family Medicine

## 2024-11-30 ENCOUNTER — Ambulatory Visit: Admitting: Family Medicine
